# Patient Record
Sex: Female | Born: 1985 | Race: White | Hispanic: No | Smoking: Never smoker
Health system: Southern US, Community
[De-identification: ages and names within clinical notes are randomized; demographics above are authoritative.]

## PROBLEM LIST (undated history)

## (undated) DIAGNOSIS — Z789 Other specified health status: Secondary | ICD-10-CM

## (undated) DIAGNOSIS — Z8489 Family history of other specified conditions: Secondary | ICD-10-CM

## (undated) DIAGNOSIS — R112 Nausea with vomiting, unspecified: Secondary | ICD-10-CM

## (undated) DIAGNOSIS — O039 Complete or unspecified spontaneous abortion without complication: Secondary | ICD-10-CM

## (undated) DIAGNOSIS — E559 Vitamin D deficiency, unspecified: Secondary | ICD-10-CM

## (undated) DIAGNOSIS — T4145XA Adverse effect of unspecified anesthetic, initial encounter: Secondary | ICD-10-CM

## (undated) DIAGNOSIS — R0600 Dyspnea, unspecified: Secondary | ICD-10-CM

## (undated) DIAGNOSIS — F32A Depression, unspecified: Secondary | ICD-10-CM

## (undated) DIAGNOSIS — F329 Major depressive disorder, single episode, unspecified: Secondary | ICD-10-CM

## (undated) DIAGNOSIS — Z9889 Other specified postprocedural states: Secondary | ICD-10-CM

## (undated) DIAGNOSIS — K219 Gastro-esophageal reflux disease without esophagitis: Secondary | ICD-10-CM

## (undated) DIAGNOSIS — R600 Localized edema: Secondary | ICD-10-CM

## (undated) DIAGNOSIS — F319 Bipolar disorder, unspecified: Secondary | ICD-10-CM

## (undated) DIAGNOSIS — M549 Dorsalgia, unspecified: Secondary | ICD-10-CM

## (undated) DIAGNOSIS — F419 Anxiety disorder, unspecified: Secondary | ICD-10-CM

## (undated) DIAGNOSIS — M25371 Other instability, right ankle: Secondary | ICD-10-CM

## (undated) DIAGNOSIS — T8859XA Other complications of anesthesia, initial encounter: Secondary | ICD-10-CM

## (undated) HISTORY — DX: Depression, unspecified: F32.A

## (undated) HISTORY — DX: Complete or unspecified spontaneous abortion without complication: O03.9

## (undated) HISTORY — DX: Localized edema: R60.0

## (undated) HISTORY — DX: Major depressive disorder, single episode, unspecified: F32.9

## (undated) HISTORY — DX: Dyspnea, unspecified: R06.00

## (undated) HISTORY — DX: Dorsalgia, unspecified: M54.9

## (undated) HISTORY — DX: Other specified health status: Z78.9

## (undated) HISTORY — DX: Vitamin D deficiency, unspecified: E55.9

## (undated) HISTORY — PX: NO PAST SURGERIES: SHX2092

---
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
## 2009-06-26 ENCOUNTER — Inpatient Hospital Stay (HOSPITAL_COMMUNITY): Admission: AD | Admit: 2009-06-26 | Discharge: 2009-06-26 | Payer: Self-pay | Admitting: Obstetrics & Gynecology

## 2009-06-28 ENCOUNTER — Ambulatory Visit (HOSPITAL_COMMUNITY): Admission: RE | Admit: 2009-06-28 | Discharge: 2009-06-28 | Payer: Self-pay | Admitting: Obstetrics & Gynecology

## 2009-07-05 ENCOUNTER — Ambulatory Visit: Payer: Self-pay | Admitting: Obstetrics and Gynecology

## 2009-07-05 ENCOUNTER — Ambulatory Visit (HOSPITAL_COMMUNITY): Admission: RE | Admit: 2009-07-05 | Discharge: 2009-07-05 | Payer: Self-pay | Admitting: Obstetrics & Gynecology

## 2010-04-30 LAB — CBC
HCT: 41.4 % (ref 36.0–46.0)
Hemoglobin: 14.5 g/dL (ref 12.0–15.0)
MCHC: 35 g/dL (ref 30.0–36.0)
MCV: 89 fL (ref 78.0–100.0)
Platelets: 205 10*3/uL (ref 150–400)
RBC: 4.65 MIL/uL (ref 3.87–5.11)
WBC: 7 10*3/uL (ref 4.0–10.5)

## 2010-04-30 LAB — HCG, QUANTITATIVE, PREGNANCY
hCG, Beta Chain, Quant, S: 31 m[IU]/mL — ABNORMAL HIGH (ref <5)
hCG, Beta Chain, Quant, S: 4 m[IU]/mL (ref <5)
hCG, Beta Chain, Quant, S: 56 m[IU]/mL — ABNORMAL HIGH (ref <5)

## 2010-06-29 NOTE — Op Note (Signed)
[name], Kelly Fry NO.:  [id number]   MEDICAL RECORD NO.:  [id number]          PATIENT TYPE:  AMB   LOCATION:  DSC                          FACILITY:  [facility]   PHYSICIAN:  Lucky Cowboy, MD         DATE OF BIRTH:  [birth year]   DATE OF PROCEDURE:  07/04/2005  DATE OF DISCHARGE:  05/24/2005                                 OPERATIVE REPORT   PREOPERATIVE DIAGNOSIS:  Right peritonsillar abscess.   POSTOPERATIVE DIAGNOSIS:  Right peritonsillar abscess.   PROCEDURE:  Incision and drainage of right peritonsillar abscess.   SURGEON:  Lucky Cowboy, MD.   ANESTHESIA:  General.   ESTIMATED BLOOD LOSS:  Less than 20 mL.   SPECIMENS:  None.   COMPLICATIONS:  None.   INDICATIONS:  The patient is a 25 year old female who has had increasing  sore throat with the findings of a right peritonsillar abscess and enlarged  lymph nodes in the right cervical chain.  For this reason, incision and  drainage is performed.   FINDINGS:  The patient was noted to have a marked amount of pus in the right  peritonsillar space.   PROCEDURE:  The patient was taken to the operating room and placed on the  table in the supine position.  She was then placed under general  endotracheal anesthesia and the table rotated counterclockwise 90 degrees.  The neck was gently extended.  A Crowe-Davis mouth gag with a #3 tongue  blade was then placed intraorally, opened and suspended on the Mayo stand.  Palpation of the soft palate revealed marked fullness in the right soft  palate.  The Harmonic scalpel was then used to open up the right soft palate  were pus was evacuated.  The area was irrigated.  An NG tube was placed down  the esophagus for suctioning of the gastric contents.  The mouth gag was  removed  noting no damage to the teeth or soft tissues.  The table was rotated  clockwise 90 degrees to its original position.  The patient was awakened  from anesthesia and taken to the Post Anesthesia  Care Unit in stable  condition.  There were no complications.      Lucky Cowboy, MD  Electronically Signed     SJ/MEDQ  D:  07/04/2005  T:  07/05/2005  Job:  [id number]   cc:   [facility]
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
## 2013-02-11 NOTE — L&D Delivery Note (Signed)
Delivery Note  PRE-OPERATIVE DIAGNOSIS:  1) [redacted]w[redacted]d pregnancy.   POST-OPERATIVE DIAGNOSIS:  1) [redacted]w[redacted]d pregnancy s/p Vaginal, Spontaneous Delivery   Delivery Type: Vaginal, Spontaneous Delivery   Delivery Clinician: Aydrien Froman   Delivery Anesthesia: None   Labor Complications: None  Lacerations: None   ESTIMATED BLOOD LOSS: 300    Labor course: This is a 28 y.o. y.o. female [id number]  who came in at [redacted]w[redacted]d pregnancy for IOL 2/2 gHTN.  Her prenatal course was complicated by gHTN.  Initial cervical exam was 2.5/30/-2.  She was admitted to L and D.  Labor course included:  Cytotec x 1 Pitocin SROM   Procedure: Vaginal, Spontaneous Delivery    Date of birth: 11/16/2011   Time of birth: 2:03 AM    This [id number] woman under no anesthesia delivered a viable female  infant weighing Weight: 7 lb 10 oz (3.459 kg) (Filed from Delivery Summary)  with Apgars as listed below.  Delivery was via NSVD  Delivery completed and cord cut and clamped. Infant dried and stimulated. Infant to maternal abdomen. Cord pH not obtained. Active management of the third stage of labor performed. Intact placenta delivered spontaneously at 10/6  2:06 AM . Vagina and rectum explored and small periurethreal abrasions  Noted with good approximation of tissue and hemostasis.  Uterus well contracted at end of delivery.  Mother and infant tolerated delivery well.    FINDINGS:   1) female infant, Apgar scores of 9    at 1 minute 9    at 5 minutes   2) 3 Vessel Cord  3) Nuchal: Yes, loose  SPECIMENS: Placenta discarded; Cord gases not obtained  COMPLICATIONS: none  DISPOSITION:  Infant to NBN

## 2013-02-11 NOTE — L&D Delivery Note (Signed)
Attestation of Attending Supervision of Fellow: Evaluation and management procedures were performed by the Fellow under my supervision and collaboration.  I have reviewed the Fellow's note and chart, and I agree with the management and plan.    

## 2013-06-01 ENCOUNTER — Ambulatory Visit (INDEPENDENT_AMBULATORY_CARE_PROVIDER_SITE_OTHER): Payer: Self-pay

## 2013-06-01 ENCOUNTER — Encounter: Payer: Self-pay | Admitting: Obstetrics & Gynecology

## 2013-06-01 DIAGNOSIS — Z3201 Encounter for pregnancy test, result positive: Secondary | ICD-10-CM

## 2013-06-01 DIAGNOSIS — N926 Irregular menstruation, unspecified: Secondary | ICD-10-CM

## 2013-06-01 LAB — POCT PREGNANCY, URINE
PREG TEST UR: POSITIVE — AB
Preg Test, Ur: POSITIVE — AB

## 2013-06-01 NOTE — Progress Notes (Signed)
Pt. Here today for pregnancy test as she has not had a period since the middle of January; unsure of actual date of LMP though believes it was around the 11th.  Pregnancy test positive. Pt. Would like to receive care here.Pt. Would be around [redacted]w[redacted]d today based off LMP 1/11. New OB labs today. Letter of pregnancy verification given. Growth and anatomy ultrasound scheduled for July 12, 2013. EDD October 18th.

## 2013-06-02 LAB — OBSTETRIC PANEL
ANTIBODY SCREEN: NEGATIVE
Basophils Absolute: 0 10*3/uL (ref 0.0–0.1)
Basophils Relative: 0 % (ref 0–1)
EOS PCT: 1 % (ref 0–5)
Eosinophils Absolute: 0.1 10*3/uL (ref 0.0–0.7)
HEMATOCRIT: 36.5 % (ref 36.0–46.0)
HEMOGLOBIN: 12.6 g/dL (ref 12.0–15.0)
Hepatitis B Surface Ag: NEGATIVE
Lymphocytes Relative: 22 % (ref 12–46)
Lymphs Abs: 1.4 10*3/uL (ref 0.7–4.0)
MCH: 29.3 pg (ref 26.0–34.0)
MCHC: 34.5 g/dL (ref 30.0–36.0)
MCV: 84.9 fL (ref 78.0–100.0)
Monocytes Absolute: 0.4 10*3/uL (ref 0.1–1.0)
Monocytes Relative: 7 % (ref 3–12)
Neutro Abs: 4.3 10*3/uL (ref 1.7–7.7)
Neutrophils Relative %: 70 % (ref 43–77)
Platelets: 202 10*3/uL (ref 150–400)
RBC: 4.3 MIL/uL (ref 3.87–5.11)
RDW: 13.5 % (ref 11.5–15.5)
RH TYPE: NEGATIVE
Rubella: 1.74 Index — ABNORMAL HIGH (ref <0.90)
WBC: 6.2 10*3/uL (ref 4.0–10.5)

## 2013-06-02 LAB — CYSTIC FIBROSIS DIAGNOSTIC STUDY

## 2013-06-02 LAB — HIV ANTIBODY (ROUTINE TESTING W REFLEX): HIV 1&2 Ab, 4th Generation: NONREACTIVE

## 2013-06-03 ENCOUNTER — Encounter: Payer: Self-pay | Admitting: Obstetrics & Gynecology

## 2013-06-03 DIAGNOSIS — O26899 Other specified pregnancy related conditions, unspecified trimester: Secondary | ICD-10-CM | POA: Insufficient documentation

## 2013-06-03 DIAGNOSIS — Z6791 Unspecified blood type, Rh negative: Secondary | ICD-10-CM | POA: Insufficient documentation

## 2013-06-30 ENCOUNTER — Other Ambulatory Visit (HOSPITAL_COMMUNITY)
Admission: RE | Admit: 2013-06-30 | Discharge: 2013-06-30 | Disposition: A | Discharge status: Home / Self Care | Payer: Medicaid Other | Source: Ambulatory Visit | Attending: Obstetrics and Gynecology | Admitting: Obstetrics and Gynecology

## 2013-06-30 ENCOUNTER — Telehealth: Payer: Self-pay | Admitting: General Practice

## 2013-06-30 ENCOUNTER — Ambulatory Visit (INDEPENDENT_AMBULATORY_CARE_PROVIDER_SITE_OTHER): Payer: Medicaid Other | Admitting: Obstetrics and Gynecology

## 2013-06-30 ENCOUNTER — Encounter: Payer: Self-pay | Admitting: Obstetrics and Gynecology

## 2013-06-30 VITALS — BP 134/80 | HR 87 | Ht 64.0 in | Wt 228.0 lb

## 2013-06-30 DIAGNOSIS — Z6791 Unspecified blood type, Rh negative: Secondary | ICD-10-CM

## 2013-06-30 DIAGNOSIS — Z01419 Encounter for gynecological examination (general) (routine) without abnormal findings: Secondary | ICD-10-CM | POA: Insufficient documentation

## 2013-06-30 DIAGNOSIS — O0930 Supervision of pregnancy with insufficient antenatal care, unspecified trimester: Secondary | ICD-10-CM

## 2013-06-30 DIAGNOSIS — O0932 Supervision of pregnancy with insufficient antenatal care, second trimester: Secondary | ICD-10-CM | POA: Insufficient documentation

## 2013-06-30 DIAGNOSIS — O360990 Maternal care for other rhesus isoimmunization, unspecified trimester, not applicable or unspecified: Secondary | ICD-10-CM

## 2013-06-30 DIAGNOSIS — O26899 Other specified pregnancy related conditions, unspecified trimester: Principal | ICD-10-CM

## 2013-06-30 DIAGNOSIS — B372 Candidiasis of skin and nail: Secondary | ICD-10-CM

## 2013-06-30 DIAGNOSIS — Z113 Encounter for screening for infections with a predominantly sexual mode of transmission: Secondary | ICD-10-CM | POA: Insufficient documentation

## 2013-06-30 LAB — POCT URINALYSIS DIP (DEVICE)
Bilirubin Urine: NEGATIVE
GLUCOSE, UA: NEGATIVE mg/dL
HGB URINE DIPSTICK: NEGATIVE
KETONES UR: NEGATIVE mg/dL
NITRITE: NEGATIVE
Protein, ur: NEGATIVE mg/dL
Specific Gravity, Urine: 1.02 (ref 1.005–1.030)
UROBILINOGEN UA: 1 mg/dL (ref 0.0–1.0)
pH: 7 (ref 5.0–8.0)

## 2013-06-30 MED ORDER — TRIAMCINOLONE 0.1 % CREAM:EUCERIN CREAM 1:1: 1.0000 "application " | TOPICAL_CREAM | Freq: Three times a day (TID) | CUTANEOUS | Status: DC

## 2013-06-30 NOTE — Telephone Encounter (Signed)
Open in error

## 2013-06-30 NOTE — Progress Notes (Signed)
Pt complains of nausea and vomiting. Patient reports a rash on her abdomen and in her thighs.  New ob packet given to patient. She doesn't know her prepregnancy weight.

## 2013-06-30 NOTE — Progress Notes (Signed)
   Subjective:    Kelly Fry is a [id number] [redacted]w[redacted]d being seen today for her first obstetrical visit.  Her obstetrical history is significant for obesity. Patient does intend to breast feed. Pregnancy history fully reviewed.  Patient reports rash below breasts.  Filed Vitals:   06/30/13 0914 06/30/13 0918  BP: 134/80   Pulse: 87   Height:  5\' 4"  (1.626 m)  Weight: 228 lb (103.42 kg)     HISTORY: OB History  Gravida Para Term Preterm AB SAB TAB Ectopic Multiple Living  3 1 1  0 1 1 0 0 0 1    # Outcome Date GA Lbr Len/2nd Weight Sex Delivery Anes PTL Lv  3 CUR           2 SAB 2012          1 TRM 01/30/06 [redacted]w[redacted]d  7 lb 8 oz (3.402 kg) F SVD   Y     Comments: No complications      Past Medical History  Diagnosis Date  . Medical history non-contributory    Past Surgical History  Procedure Laterality Date  . No past surgeries     Family History  Problem Relation Age of Onset  . Kidney disease Father   . Kidney disease Sister      Exam    Uterus:   S sl > D, sure LMP  Pelvic Exam:    Perineum: No Hemorrhoids, Normal Perineum   Vulva: normal, Bartholin's, Urethra, Skene's normal   Vagina:  normal mucosa, normal discharge       Cervix: multiparous appearance and no bleeding following Pap   Adnexa: not evaluated   Bony Pelvis: average  System: Breast:  normal appearance, no masses or tenderness   Skin: normal coloration and turgor, no rashes Erythematous in intertriginous area below breasts   Neurologic: oriented, grossly non-focal   Extremities: normal strength, tone, and muscle mass, no erythema, induration, or nodules   HEENT PERRLA and extra ocular movement intact   Mouth/Teeth mucous membranes moist, pharynx normal without lesions   Neck supple and no masses   Cardiovascular: regular rate and rhythm, no murmurs or gallops   Respiratory:  appears well, vitals normal, no respiratory distress, acyanotic, normal RR, ear and throat exam is normal, neck free of  mass or lymphadenopathy, chest clear, no wheezing, crepitations, rhonchi, normal symmetric air entry   Abdomen: soft, non-tender; bowel sounds normal; no masses,  no organomegaly and FH= 21; FHR 144   Urinary: urethral meatus normal      Assessment:    Pregnancy: [id number] Patient Active Problem List   Diagnosis Date Noted  . Insufficient prenatal care in second trimester 06/30/2013  . Rh negative, antepartum 06/03/2013    Intertriginous rash breasts    Plan:     Initial labs drawn. Prenatal vitamins. Problem list reviewed and updated. Genetic Screening discussed Quad Screen: declined.  Ultrasound discussed; fetal survey: ordered.  Follow up in 4 weeks. 50% of 30 min visit spent on counseling and coordination of care.  Discussed continuing PN care in [place] at [facility] since she lives and works at [employer] in [place]; came here because wants delivery at [employer]. Rx Triancinalone cream   Arne Schlender C Jerek Meulemans 06/30/2013

## 2013-07-03 LAB — CULTURE, OB URINE: Colony Count: >=100000

## 2013-07-06 ENCOUNTER — Telehealth: Payer: Self-pay

## 2013-07-06 DIAGNOSIS — N390 Urinary tract infection, site not specified: Secondary | ICD-10-CM

## 2013-07-06 MED ORDER — CEPHALEXIN 500 MG PO CAPS: 500.0000 mg | ORAL_CAPSULE | Freq: Four times a day (QID) | ORAL | Status: DC

## 2013-07-06 NOTE — Telephone Encounter (Signed)
Message copied by Geanie Logan on Tue Jul 06, 2013  9:25 AM ------      Message from: Lorene Dy      Created: Sat Jul 03, 2013  6:11 PM       Please call in Keflex 500 qid x 7d ------

## 2013-07-06 NOTE — Telephone Encounter (Signed)
Medication e-prescribed. Patient informed. No questions or concerns.

## 2013-07-12 ENCOUNTER — Ambulatory Visit (HOSPITAL_COMMUNITY)
Admission: RE | Admit: 2013-07-12 | Discharge: 2013-07-12 | Disposition: A | Discharge status: Home / Self Care | Payer: Medicaid Other | Source: Ambulatory Visit | Attending: Obstetrics & Gynecology | Admitting: Obstetrics & Gynecology

## 2013-07-12 DIAGNOSIS — Z3689 Encounter for other specified antenatal screening: Secondary | ICD-10-CM | POA: Insufficient documentation

## 2013-07-12 DIAGNOSIS — Z3201 Encounter for pregnancy test, result positive: Secondary | ICD-10-CM

## 2013-07-13 ENCOUNTER — Encounter: Payer: Self-pay | Admitting: Obstetrics & Gynecology

## 2013-07-28 ENCOUNTER — Encounter: Payer: Self-pay | Admitting: Advanced Practice Midwife

## 2013-07-28 ENCOUNTER — Ambulatory Visit (INDEPENDENT_AMBULATORY_CARE_PROVIDER_SITE_OTHER): Payer: Medicaid Other | Admitting: Advanced Practice Midwife

## 2013-07-28 VITALS — BP 122/85 | HR 74 | Temp 98.9°F | Wt 231.0 lb

## 2013-07-28 DIAGNOSIS — R7309 Other abnormal glucose: Secondary | ICD-10-CM

## 2013-07-28 DIAGNOSIS — Z6791 Unspecified blood type, Rh negative: Secondary | ICD-10-CM

## 2013-07-28 DIAGNOSIS — O360990 Maternal care for other rhesus isoimmunization, unspecified trimester, not applicable or unspecified: Secondary | ICD-10-CM

## 2013-07-28 DIAGNOSIS — O26899 Other specified pregnancy related conditions, unspecified trimester: Principal | ICD-10-CM

## 2013-07-28 LAB — POCT URINALYSIS DIP (DEVICE)
Bilirubin Urine: NEGATIVE
GLUCOSE, UA: NEGATIVE mg/dL
HGB URINE DIPSTICK: NEGATIVE
Ketones, ur: NEGATIVE mg/dL
Nitrite: NEGATIVE
Protein, ur: NEGATIVE mg/dL
SPECIFIC GRAVITY, URINE: 1.02 (ref 1.005–1.030)
Urobilinogen, UA: 1 mg/dL (ref 0.0–1.0)
pH: 7 (ref 5.0–8.0)

## 2013-07-28 MED ORDER — TRIAMCINOLONE 0.1 % CREAM:EUCERIN CREAM 1:1: 1.0000 "application " | TOPICAL_CREAM | Freq: Two times a day (BID) | CUTANEOUS | Status: DC | PRN

## 2013-07-28 NOTE — Progress Notes (Signed)
Doing well.  Early glucola done.

## 2013-07-28 NOTE — Progress Notes (Signed)
Edema trace in feet. Early Glucola test- done.

## 2013-07-28 NOTE — Patient Instructions (Signed)
Second Trimester of Pregnancy The second trimester is from week 13 through week 28, months 4 through 6. The second trimester is often a time when you feel your best. Your body has also adjusted to being pregnant, and you begin to feel better physically. Usually, morning sickness has lessened or quit completely, you may have more energy, and you may have an increase in appetite. The second trimester is also a time when the fetus is growing rapidly. At the end of the sixth month, the fetus is about 9 inches long and weighs about 1 pounds. You will likely begin to feel the baby move (quickening) between 18 and 20 weeks of the pregnancy. BODY CHANGES Your body goes through many changes during pregnancy. The changes vary from woman to woman.   Your weight will continue to increase. You will notice your lower abdomen bulging out.  You may begin to get stretch marks on your hips, abdomen, and breasts.  You may develop headaches that can be relieved by medicines approved by your health care provider.  You may urinate more often because the fetus is pressing on your bladder.  You may develop or continue to have heartburn as a result of your pregnancy.  You may develop constipation because certain hormones are causing the muscles that push waste through your intestines to slow down.  You may develop hemorrhoids or swollen, bulging veins (varicose veins).  You may have back pain because of the weight gain and pregnancy hormones relaxing your joints between the bones in your pelvis and as a result of a shift in weight and the muscles that support your balance.  Your breasts will continue to grow and be tender.  Your gums may bleed and may be sensitive to brushing and flossing.  Dark spots or blotches (chloasma, mask of pregnancy) may develop on your face. This will likely fade after the baby is born.  A dark line from your belly button to the pubic area (linea nigra) may appear. This will likely fade  after the baby is born.  You may have changes in your hair. These can include thickening of your hair, rapid growth, and changes in texture. Some women also have hair loss during or after pregnancy, or hair that feels dry or thin. Your hair will most likely return to normal after your baby is born. WHAT TO EXPECT AT YOUR PRENATAL VISITS During a routine prenatal visit:  You will be weighed to make sure you and the fetus are growing normally.  Your blood pressure will be taken.  Your abdomen will be measured to track your baby's growth.  The fetal heartbeat will be listened to.  Any test results from the previous visit will be discussed. Your health care provider may ask you:  How you are feeling.  If you are feeling the baby move.  If you have had any abnormal symptoms, such as leaking fluid, bleeding, severe headaches, or abdominal cramping.  If you have any questions. Other tests that may be performed during your second trimester include:  Blood tests that check for:  Low iron levels (anemia).  Gestational diabetes (between 24 and 28 weeks).  Rh antibodies.  Urine tests to check for infections, diabetes, or protein in the urine.  An ultrasound to confirm the proper growth and development of the baby.  An amniocentesis to check for possible genetic problems.  Fetal screens for spina bifida and Down syndrome. HOME CARE INSTRUCTIONS   Avoid all smoking, herbs, alcohol, and unprescribed   drugs. These chemicals affect the formation and growth of the baby.  Follow your health care provider's instructions regarding medicine use. There are medicines that are either safe or unsafe to take during pregnancy.  Exercise only as directed by your health care provider. Experiencing uterine cramps is a good sign to stop exercising.  Continue to eat regular, healthy meals.  Wear a good support bra for breast tenderness.  Do not use hot tubs, steam rooms, or saunas.  Wear your  seat belt at all times when driving.  Avoid raw meat, uncooked cheese, cat litter boxes, and soil used by cats. These carry germs that can cause birth defects in the baby.  Take your prenatal vitamins.  Try taking a stool softener (if your health care provider approves) if you develop constipation. Eat more high-fiber foods, such as fresh vegetables or fruit and whole grains. Drink plenty of fluids to keep your urine clear or pale yellow.  Take warm sitz baths to soothe any pain or discomfort caused by hemorrhoids. Use hemorrhoid cream if your health care provider approves.  If you develop varicose veins, wear support hose. Elevate your feet for 15 minutes, 3-4 times a day. Limit salt in your diet.  Avoid heavy lifting, wear low heel shoes, and practice good posture.  Rest with your legs elevated if you have leg cramps or low back pain.  Visit your dentist if you have not gone yet during your pregnancy. Use a soft toothbrush to brush your teeth and be gentle when you floss.  A sexual relationship may be continued unless your health care provider directs you otherwise.  Continue to go to all your prenatal visits as directed by your health care provider. SEEK MEDICAL CARE IF:   You have dizziness.  You have mild pelvic cramps, pelvic pressure, or nagging pain in the abdominal area.  You have persistent nausea, vomiting, or diarrhea.  You have a bad smelling vaginal discharge.  You have pain with urination. SEEK IMMEDIATE MEDICAL CARE IF:   You have a fever.  You are leaking fluid from your vagina.  You have spotting or bleeding from your vagina.  You have severe abdominal cramping or pain.  You have rapid weight gain or loss.  You have shortness of breath with chest pain.  You notice sudden or extreme swelling of your face, hands, ankles, feet, or legs.  You have not felt your baby move in over an hour.  You have severe headaches that do not go away with  medicine.  You have vision changes. Document Released: 01/22/2001 Document Revised: 02/02/2013 Document Reviewed: 03/31/2012 ExitCare Patient Information 2015 [employer]. This information is not intended to replace advice given to you by your health care provider. Make sure you discuss any questions you have with your health care provider.  

## 2013-07-29 LAB — GLUCOSE TOLERANCE, 1 HOUR (50G) W/O FASTING: GLUCOSE 1 HOUR GTT: 128 mg/dL (ref 70–140)

## 2013-08-03 ENCOUNTER — Encounter (HOSPITAL_COMMUNITY): Payer: Self-pay | Admitting: Advanced Practice Midwife

## 2013-08-09 LAB — PRESCRIPTION MONITORING PROFILE (19 PANEL)
AMPHETAMINE/METH: NEGATIVE ng/mL
BARBITURATE SCREEN, URINE: NEGATIVE ng/mL
BENZODIAZEPINE SCREEN, URINE: NEGATIVE ng/mL
BUPRENORPHINE, URINE: NEGATIVE ng/mL
CREATININE, URINE: 105.74 mg/dL (ref >20.0)
Cannabinoid Scrn, Ur: NEGATIVE ng/mL
Carisoprodol, Urine: NEGATIVE ng/mL
Cocaine Metabolites: NEGATIVE ng/mL
ECSTASY: NEGATIVE ng/mL
FENTANYL URINE: NEGATIVE ng/mL
MEPERIDINE UR: NEGATIVE ng/mL
METHADONE SCREEN, URINE: NEGATIVE ng/mL
METHAQUALONE SCREEN (URINE): NEGATIVE ng/mL
NITRITES URINE, INITIAL: NEGATIVE ug/mL
Opiate Screen, Urine: NEGATIVE ng/mL
Oxycodone Screen, Ur: NEGATIVE ng/mL
PROPOXYPHENE: NEGATIVE ng/mL
Phencyclidine, Ur: NEGATIVE ng/mL
TAPENTADOLUR: NEGATIVE ng/mL
Tramadol Scrn, Ur: NEGATIVE ng/mL
Zolpidem, Urine: NEGATIVE ng/mL
pH, Initial: 7.6 pH (ref 4.5–8.9)

## 2013-08-16 ENCOUNTER — Ambulatory Visit (HOSPITAL_COMMUNITY)
Admission: RE | Admit: 2013-08-16 | Discharge: 2013-08-16 | Disposition: A | Discharge status: Home / Self Care | Payer: Medicaid Other | Source: Ambulatory Visit | Attending: Advanced Practice Midwife | Admitting: Advanced Practice Midwife

## 2013-08-16 DIAGNOSIS — Z3689 Encounter for other specified antenatal screening: Secondary | ICD-10-CM | POA: Diagnosis not present

## 2013-08-16 DIAGNOSIS — Z6791 Unspecified blood type, Rh negative: Secondary | ICD-10-CM

## 2013-08-16 DIAGNOSIS — O26899 Other specified pregnancy related conditions, unspecified trimester: Secondary | ICD-10-CM

## 2013-08-16 DIAGNOSIS — O360990 Maternal care for other rhesus isoimmunization, unspecified trimester, not applicable or unspecified: Secondary | ICD-10-CM | POA: Diagnosis not present

## 2013-08-25 ENCOUNTER — Ambulatory Visit (INDEPENDENT_AMBULATORY_CARE_PROVIDER_SITE_OTHER): Payer: Medicaid Other | Admitting: Family

## 2013-08-25 VITALS — BP 129/84 | HR 91 | Temp 98.1°F | Wt 239.2 lb

## 2013-08-25 DIAGNOSIS — Z3480 Encounter for supervision of other normal pregnancy, unspecified trimester: Secondary | ICD-10-CM

## 2013-08-25 DIAGNOSIS — Z3492 Encounter for supervision of normal pregnancy, unspecified, second trimester: Secondary | ICD-10-CM

## 2013-08-25 LAB — CBC
HCT: 33.3 % — ABNORMAL LOW (ref 36.0–46.0)
HEMOGLOBIN: 11.5 g/dL — AB (ref 12.0–15.0)
MCH: 29.9 pg (ref 26.0–34.0)
MCHC: 34.5 g/dL (ref 30.0–36.0)
MCV: 86.5 fL (ref 78.0–100.0)
PLATELETS: 201 10*3/uL (ref 150–400)
RBC: 3.85 MIL/uL — ABNORMAL LOW (ref 3.87–5.11)
RDW: 13.3 % (ref 11.5–15.5)
WBC: 8.4 10*3/uL (ref 4.0–10.5)

## 2013-08-25 LAB — POCT URINALYSIS DIP (DEVICE)
Bilirubin Urine: NEGATIVE
Glucose, UA: NEGATIVE mg/dL
HGB URINE DIPSTICK: NEGATIVE
Ketones, ur: NEGATIVE mg/dL
NITRITE: NEGATIVE
Protein, ur: NEGATIVE mg/dL
Specific Gravity, Urine: 1.015 (ref 1.005–1.030)
UROBILINOGEN UA: 0.2 mg/dL (ref 0.0–1.0)
pH: 7 (ref 5.0–8.0)

## 2013-08-25 MED ORDER — NITROFURANTOIN MONOHYD MACRO 100 MG PO CAPS: 100.0000 mg | ORAL_CAPSULE | Freq: Two times a day (BID) | ORAL | Status: DC

## 2013-08-25 NOTE — Progress Notes (Signed)
Doing well; no questions or concerns.  Reviewed ultrasound results (nml heart).  Large leuks in urine, no symptoms.  RX Macrobid, urine culture sent.  1 hr today.

## 2013-08-25 NOTE — Progress Notes (Signed)
Needs Rhogam, pt is undecided about TDap vaccine, information sheet given.

## 2013-08-26 ENCOUNTER — Telehealth: Payer: Self-pay

## 2013-08-26 ENCOUNTER — Encounter: Payer: Self-pay | Admitting: Family

## 2013-08-26 LAB — HIV ANTIBODY (ROUTINE TESTING W REFLEX): HIV: NONREACTIVE

## 2013-08-26 LAB — RPR

## 2013-08-26 LAB — GLUCOSE TOLERANCE, 1 HOUR (50G) W/O FASTING: Glucose, 1 Hour GTT: 137 mg/dL (ref 70–140)

## 2013-08-26 NOTE — Telephone Encounter (Signed)
Pt returned call to the clinic.   Attempted to contact patient with number provided by patient, no answer, unable to leave a message.

## 2013-08-26 NOTE — Telephone Encounter (Addendum)
Attempted to call patient regarding 1hr gtt results and the need for 3hr gtt. Felipa Evener number has been disconnected. Called other contact who stated the number we have is correct-- patient may not have phone on. Contact stated she will let patient know we are calling and have her call clinic.

## 2013-08-26 NOTE — Telephone Encounter (Signed)
Contacted patient, gave results, scheduled patient for 3 hour gtt on Monday July 20 @ 0800.  Instructions given, pt verbalizes understanding.

## 2013-08-30 ENCOUNTER — Other Ambulatory Visit: Payer: Medicaid Other

## 2013-08-30 DIAGNOSIS — R7309 Other abnormal glucose: Secondary | ICD-10-CM

## 2013-08-30 LAB — CULTURE, OB URINE

## 2013-08-31 ENCOUNTER — Encounter: Payer: Self-pay | Admitting: Family

## 2013-08-31 LAB — GLUCOSE TOLERANCE, 3 HOURS
GLUCOSE 3 HOUR GTT: 91 mg/dL (ref 70–144)
GLUCOSE, 1 HOUR-GESTATIONAL: 125 mg/dL (ref 70–189)
Glucose Tolerance, 2 hour: 119 mg/dL (ref 70–164)
Glucose Tolerance, Fasting: 72 mg/dL (ref 70–104)

## 2013-09-15 ENCOUNTER — Ambulatory Visit (INDEPENDENT_AMBULATORY_CARE_PROVIDER_SITE_OTHER): Payer: Medicaid Other | Admitting: Family

## 2013-09-15 VITALS — BP 139/78 | HR 79 | Temp 98.4°F | Wt 239.5 lb

## 2013-09-15 DIAGNOSIS — Z23 Encounter for immunization: Secondary | ICD-10-CM

## 2013-09-15 DIAGNOSIS — O360131 Maternal care for anti-D [Rh] antibodies, third trimester, fetus 1: Secondary | ICD-10-CM

## 2013-09-15 DIAGNOSIS — O360990 Maternal care for other rhesus isoimmunization, unspecified trimester, not applicable or unspecified: Secondary | ICD-10-CM

## 2013-09-15 DIAGNOSIS — O3090 Multiple gestation, unspecified, unspecified trimester: Secondary | ICD-10-CM

## 2013-09-15 LAB — POCT URINALYSIS DIP (DEVICE)
Bilirubin Urine: NEGATIVE
GLUCOSE, UA: NEGATIVE mg/dL
Ketones, ur: NEGATIVE mg/dL
NITRITE: NEGATIVE
Protein, ur: NEGATIVE mg/dL
Specific Gravity, Urine: 1.01 (ref 1.005–1.030)
UROBILINOGEN UA: 0.2 mg/dL (ref 0.0–1.0)
pH: 6 (ref 5.0–8.0)

## 2013-09-15 MED ORDER — RHO D IMMUNE GLOBULIN 1500 UNIT/2ML IJ SOSY
300.0000 ug | PREFILLED_SYRINGE | Freq: Once | INTRAMUSCULAR | Status: AC
  Administered 2013-09-15: 300 ug via INTRAMUSCULAR

## 2013-09-15 MED ORDER — RHO D IMMUNE GLOBULIN 1500 UNIT/2ML IJ SOSY: 300.0000 ug | PREFILLED_SYRINGE | Freq: Once | INTRAMUSCULAR | Status: DC

## 2013-09-15 MED ORDER — TETANUS-DIPHTH-ACELL PERTUSSIS 5-2.5-18.5 LF-MCG/0.5 IM SUSP: 0.5000 mL | Freq: Once | INTRAMUSCULAR | Status: DC

## 2013-09-15 NOTE — Assessment & Plan Note (Signed)
Rhogam given 09/15/2013

## 2013-09-15 NOTE — Addendum Note (Signed)
Addended by: Rutherford Nail E on: 09/15/2013 03:18 PM   Modules accepted: Orders

## 2013-09-15 NOTE — Progress Notes (Signed)
Reports occasional edema in feet and ankles.

## 2013-09-15 NOTE — Progress Notes (Signed)
Large leuks, currently taking antibiotics for UTI.  Send urine culture next week.  Rhogam today.

## 2013-09-17 LAB — CULTURE, OB URINE
COLONY COUNT: NO GROWTH
ORGANISM ID, BACTERIA: NO GROWTH

## 2013-09-29 ENCOUNTER — Ambulatory Visit (INDEPENDENT_AMBULATORY_CARE_PROVIDER_SITE_OTHER): Payer: Medicaid Other | Admitting: Advanced Practice Midwife

## 2013-09-29 VITALS — BP 131/78 | HR 77 | Temp 98.3°F | Wt 243.8 lb

## 2013-09-29 DIAGNOSIS — M25559 Pain in unspecified hip: Secondary | ICD-10-CM

## 2013-09-29 DIAGNOSIS — O9989 Other specified diseases and conditions complicating pregnancy, childbirth and the puerperium: Secondary | ICD-10-CM

## 2013-09-29 DIAGNOSIS — O26893 Other specified pregnancy related conditions, third trimester: Principal | ICD-10-CM

## 2013-09-29 LAB — POCT URINALYSIS DIP (DEVICE)
Bilirubin Urine: NEGATIVE
Glucose, UA: NEGATIVE mg/dL
Ketones, ur: NEGATIVE mg/dL
NITRITE: NEGATIVE
PROTEIN: NEGATIVE mg/dL
Specific Gravity, Urine: 1.01 (ref 1.005–1.030)
UROBILINOGEN UA: 0.2 mg/dL (ref 0.0–1.0)
pH: 7 (ref 5.0–8.0)

## 2013-09-29 MED ORDER — COMFORT FIT MATERNITY SUPP LG MISC: 1.0000 | Freq: Every day | Status: DC

## 2013-09-29 NOTE — Patient Instructions (Signed)
Third Trimester of Pregnancy The third trimester is from week 29 through week 42, months 7 through 9. The third trimester is a time when the fetus is growing rapidly. At the end of the ninth month, the fetus is about 20 inches in length and weighs 6-10 pounds.  BODY CHANGES Your body goes through many changes during pregnancy. The changes vary from woman to woman.   Your weight will continue to increase. You can expect to gain 25-35 pounds (11-16 kg) by the end of the pregnancy.  You may begin to get stretch marks on your hips, abdomen, and breasts.  You may urinate more often because the fetus is moving lower into your pelvis and pressing on your bladder.  You may develop or continue to have heartburn as a result of your pregnancy.  You may develop constipation because certain hormones are causing the muscles that push waste through your intestines to slow down.  You may develop hemorrhoids or swollen, bulging veins (varicose veins).  You may have pelvic pain because of the weight gain and pregnancy hormones relaxing your joints between the bones in your pelvis. Backaches may result from overexertion of the muscles supporting your posture.  You may have changes in your hair. These can include thickening of your hair, rapid growth, and changes in texture. Some women also have hair loss during or after pregnancy, or hair that feels dry or thin. Your hair will most likely return to normal after your baby is born.  Your breasts will continue to grow and be tender. A yellow discharge may leak from your breasts called colostrum.  Your belly button may stick out.  You may feel short of breath because of your expanding uterus.  You may notice the fetus "dropping," or moving lower in your abdomen.  You may have a bloody mucus discharge. This usually occurs a few days to a week before labor begins.  Your cervix becomes thin and soft (effaced) near your due date. WHAT TO EXPECT AT YOUR PRENATAL  EXAMS  You will have prenatal exams every 2 weeks until week 36. Then, you will have weekly prenatal exams. During a routine prenatal visit:  You will be weighed to make sure you and the fetus are growing normally.  Your blood pressure is taken.  Your abdomen will be measured to track your baby's growth.  The fetal heartbeat will be listened to.  Any test results from the previous visit will be discussed.  You may have a cervical check near your due date to see if you have effaced. At around 36 weeks, your caregiver will check your cervix. At the same time, your caregiver will also perform a test on the secretions of the vaginal tissue. This test is to determine if a type of bacteria, Group B streptococcus, is present. Your caregiver will explain this further. Your caregiver may ask you:  What your birth plan is.  How you are feeling.  If you are feeling the baby move.  If you have had any abnormal symptoms, such as leaking fluid, bleeding, severe headaches, or abdominal cramping.  If you have any questions. Other tests or screenings that may be performed during your third trimester include:  Blood tests that check for low iron levels (anemia).  Fetal testing to check the health, activity level, and growth of the fetus. Testing is done if you have certain medical conditions or if there are problems during the pregnancy. FALSE LABOR You may feel small, irregular contractions that   eventually go away. These are called Braxton Hicks contractions, or false labor. Contractions may last for hours, days, or even weeks before true labor sets in. If contractions come at regular intervals, intensify, or become painful, it is best to be seen by your caregiver.  SIGNS OF LABOR   Menstrual-like cramps.  Contractions that are 5 minutes apart or less.  Contractions that start on the top of the uterus and spread down to the lower abdomen and back.  A sense of increased pelvic pressure or back  pain.  A watery or bloody mucus discharge that comes from the vagina. If you have any of these signs before the 37th week of pregnancy, call your caregiver right away. You need to go to the hospital to get checked immediately. HOME CARE INSTRUCTIONS   Avoid all smoking, herbs, alcohol, and unprescribed drugs. These chemicals affect the formation and growth of the baby.  Follow your caregiver's instructions regarding medicine use. There are medicines that are either safe or unsafe to take during pregnancy.  Exercise only as directed by your caregiver. Experiencing uterine cramps is a good sign to stop exercising.  Continue to eat regular, healthy meals.  Wear a good support bra for breast tenderness.  Do not use hot tubs, steam rooms, or saunas.  Wear your seat belt at all times when driving.  Avoid raw meat, uncooked cheese, cat litter boxes, and soil used by cats. These carry germs that can cause birth defects in the baby.  Take your prenatal vitamins.  Try taking a stool softener (if your caregiver approves) if you develop constipation. Eat more high-fiber foods, such as fresh vegetables or fruit and whole grains. Drink plenty of fluids to keep your urine clear or pale yellow.  Take warm sitz baths to soothe any pain or discomfort caused by hemorrhoids. Use hemorrhoid cream if your caregiver approves.  If you develop varicose veins, wear support hose. Elevate your feet for 15 minutes, 3-4 times a day. Limit salt in your diet.  Avoid heavy lifting, wear low heal shoes, and practice good posture.  Rest a lot with your legs elevated if you have leg cramps or low back pain.  Visit your dentist if you have not gone during your pregnancy. Use a soft toothbrush to brush your teeth and be gentle when you floss.  A sexual relationship may be continued unless your caregiver directs you otherwise.  Do not travel far distances unless it is absolutely necessary and only with the approval  of your caregiver.  Take prenatal classes to understand, practice, and ask questions about the labor and delivery.  Make a trial run to the hospital.  Pack your hospital bag.  Prepare the baby's nursery.  Continue to go to all your prenatal visits as directed by your caregiver. SEEK MEDICAL CARE IF:  You are unsure if you are in labor or if your water has broken.  You have dizziness.  You have mild pelvic cramps, pelvic pressure, or nagging pain in your abdominal area.  You have persistent nausea, vomiting, or diarrhea.  You have a bad smelling vaginal discharge.  You have pain with urination. SEEK IMMEDIATE MEDICAL CARE IF:   You have a fever.  You are leaking fluid from your vagina.  You have spotting or bleeding from your vagina.  You have severe abdominal cramping or pain.  You have rapid weight loss or gain.  You have shortness of breath with chest pain.  You notice sudden or extreme swelling   of your face, hands, ankles, feet, or legs. °· You have not felt your baby move in over an hour. °· You have severe headaches that do not go away with medicine. °· You have vision changes. °Document Released: 01/22/2001 Document Revised: 02/02/2013 Document Reviewed: 03/31/2012 °[employer]® Patient Information ©2015 [employer]. This information is not intended to replace advice given to you by your health care provider. Make sure you discuss any questions you have with your health care provider. °Braxton Hicks Contractions °Contractions of the uterus can occur throughout pregnancy. Contractions are not always a sign that you are in labor.  °WHAT ARE BRAXTON HICKS CONTRACTIONS?  °Contractions that occur before labor are called Braxton Hicks contractions, or false labor. Toward the end of pregnancy (32-34 weeks), these contractions can develop more often and may become more forceful. This is not true labor because these contractions do not result in opening (dilatation) and thinning of  the cervix. They are sometimes difficult to tell apart from true labor because these contractions can be forceful and people have different pain tolerances. You should not feel embarrassed if you go to the hospital with false labor. Sometimes, the only way to tell if you are in true labor is for your health care provider to look for changes in the cervix. °If there are no prenatal problems or other health problems associated with the pregnancy, it is completely safe to be sent home with false labor and await the onset of true labor. °HOW CAN YOU TELL THE DIFFERENCE BETWEEN TRUE AND FALSE LABOR? °False Labor °· The contractions of false labor are usually shorter and not as hard as those of true labor.   °· The contractions are usually irregular.   °· The contractions are often felt in the front of the lower abdomen and in the groin.   °· The contractions may go away when you walk around or change positions while lying down.   °· The contractions get weaker and are shorter lasting as time goes on.   °· The contractions do not usually become progressively stronger, regular, and closer together as with true labor.   °True Labor °· Contractions in true labor last 30-70 seconds, become very regular, usually become more intense, and increase in frequency.   °· The contractions do not go away with walking.   °· The discomfort is usually felt in the top of the uterus and spreads to the lower abdomen and low back.   °· True labor can be determined by your health care provider with an exam. This will show that the cervix is dilating and getting thinner.   °WHAT TO REMEMBER °· Keep up with your usual exercises and follow other instructions given by your health care provider.   °· Take medicines as directed by your health care provider.   °· Keep your regular prenatal appointments.   °· Eat and drink lightly if you think you are going into labor.   °· If Braxton Hicks contractions are making you uncomfortable:   °· Change your  position from lying down or resting to walking, or from walking to resting.   °· Sit and rest in a tub of warm water.   °· Drink 2-3 glasses of water. Dehydration may cause these contractions.   °· Do slow and deep breathing several times an hour.   °WHEN SHOULD I SEEK IMMEDIATE MEDICAL CARE? °Seek immediate medical care if: °· Your contractions become stronger, more regular, and closer together.   °· You have fluid leaking or gushing from your vagina.   °· You have a fever.   °· You pass blood-tinged mucus.   °·   You have vaginal bleeding.   °· You have continuous abdominal pain.   °· You have low back pain that you never had before.   °· You feel your baby's head pushing down and causing pelvic pressure.   °· Your baby is not moving as much as it used to.   °Document Released: 01/28/2005 Document Revised: 02/02/2013 Document Reviewed: 11/09/2012 °[employer]® Patient Information ©2015 [employer]. This information is not intended to replace advice given to you by your health care provider. Make sure you discuss any questions you have with your health care provider. ° °

## 2013-09-29 NOTE — Progress Notes (Signed)
Patient reports a lot of hip pain lately and difficulty getting out of bed

## 2013-09-29 NOTE — Progress Notes (Signed)
Bilat hip pain after working long hours. Rec maternity support belt. Support measures.

## 2013-10-11 ENCOUNTER — Encounter: Payer: Self-pay | Admitting: Advanced Practice Midwife

## 2013-10-11 ENCOUNTER — Ambulatory Visit (INDEPENDENT_AMBULATORY_CARE_PROVIDER_SITE_OTHER): Payer: Medicaid Other | Admitting: Advanced Practice Midwife

## 2013-10-11 VITALS — BP 134/78 | HR 86 | Wt 247.0 lb

## 2013-10-11 DIAGNOSIS — M25559 Pain in unspecified hip: Secondary | ICD-10-CM

## 2013-10-11 DIAGNOSIS — O26893 Other specified pregnancy related conditions, third trimester: Principal | ICD-10-CM

## 2013-10-11 DIAGNOSIS — O9989 Other specified diseases and conditions complicating pregnancy, childbirth and the puerperium: Secondary | ICD-10-CM

## 2013-10-11 LAB — POCT URINALYSIS DIP (DEVICE)
Bilirubin Urine: NEGATIVE
Glucose, UA: NEGATIVE mg/dL
Hgb urine dipstick: NEGATIVE
KETONES UR: NEGATIVE mg/dL
Nitrite: NEGATIVE
Protein, ur: NEGATIVE mg/dL
SPECIFIC GRAVITY, URINE: 1.015 (ref 1.005–1.030)
Urobilinogen, UA: 1 mg/dL (ref 0.0–1.0)
pH: 7.5 (ref 5.0–8.0)

## 2013-10-11 NOTE — Patient Instructions (Signed)
Third Trimester of Pregnancy The third trimester is from week 29 through week 42, months 7 through 9. The third trimester is a time when the fetus is growing rapidly. At the end of the ninth month, the fetus is about 20 inches in length and weighs 6-10 pounds.  BODY CHANGES Your body goes through many changes during pregnancy. The changes vary from woman to woman.   Your weight will continue to increase. You can expect to gain 25-35 pounds (11-16 kg) by the end of the pregnancy.  You may begin to get stretch marks on your hips, abdomen, and breasts.  You may urinate more often because the fetus is moving lower into your pelvis and pressing on your bladder.  You may develop or continue to have heartburn as a result of your pregnancy.  You may develop constipation because certain hormones are causing the muscles that push waste through your intestines to slow down.  You may develop hemorrhoids or swollen, bulging veins (varicose veins).  You may have pelvic pain because of the weight gain and pregnancy hormones relaxing your joints between the bones in your pelvis. Backaches may result from overexertion of the muscles supporting your posture.  You may have changes in your hair. These can include thickening of your hair, rapid growth, and changes in texture. Some women also have hair loss during or after pregnancy, or hair that feels dry or thin. Your hair will most likely return to normal after your baby is born.  Your breasts will continue to grow and be tender. A yellow discharge may leak from your breasts called colostrum.  Your belly button may stick out.  You may feel short of breath because of your expanding uterus.  You may notice the fetus "dropping," or moving lower in your abdomen.  You may have a bloody mucus discharge. This usually occurs a few days to a week before labor begins.  Your cervix becomes thin and soft (effaced) near your due date. WHAT TO EXPECT AT YOUR PRENATAL  EXAMS  You will have prenatal exams every 2 weeks until week 36. Then, you will have weekly prenatal exams. During a routine prenatal visit:  You will be weighed to make sure you and the fetus are growing normally.  Your blood pressure is taken.  Your abdomen will be measured to track your baby's growth.  The fetal heartbeat will be listened to.  Any test results from the previous visit will be discussed.  You may have a cervical check near your due date to see if you have effaced. At around 36 weeks, your caregiver will check your cervix. At the same time, your caregiver will also perform a test on the secretions of the vaginal tissue. This test is to determine if a type of bacteria, Group B streptococcus, is present. Your caregiver will explain this further. Your caregiver may ask you:  What your birth plan is.  How you are feeling.  If you are feeling the baby move.  If you have had any abnormal symptoms, such as leaking fluid, bleeding, severe headaches, or abdominal cramping.  If you have any questions. Other tests or screenings that may be performed during your third trimester include:  Blood tests that check for low iron levels (anemia).  Fetal testing to check the health, activity level, and growth of the fetus. Testing is done if you have certain medical conditions or if there are problems during the pregnancy. FALSE LABOR You may feel small, irregular contractions that   eventually go away. These are called Braxton Hicks contractions, or false labor. Contractions may last for hours, days, or even weeks before true labor sets in. If contractions come at regular intervals, intensify, or become painful, it is best to be seen by your caregiver.  SIGNS OF LABOR   Menstrual-like cramps.  Contractions that are 5 minutes apart or less.  Contractions that start on the top of the uterus and spread down to the lower abdomen and back.  A sense of increased pelvic pressure or back  pain.  A watery or bloody mucus discharge that comes from the vagina. If you have any of these signs before the 37th week of pregnancy, call your caregiver right away. You need to go to the hospital to get checked immediately. HOME CARE INSTRUCTIONS   Avoid all smoking, herbs, alcohol, and unprescribed drugs. These chemicals affect the formation and growth of the baby.  Follow your caregiver's instructions regarding medicine use. There are medicines that are either safe or unsafe to take during pregnancy.  Exercise only as directed by your caregiver. Experiencing uterine cramps is a good sign to stop exercising.  Continue to eat regular, healthy meals.  Wear a good support bra for breast tenderness.  Do not use hot tubs, steam rooms, or saunas.  Wear your seat belt at all times when driving.  Avoid raw meat, uncooked cheese, cat litter boxes, and soil used by cats. These carry germs that can cause birth defects in the baby.  Take your prenatal vitamins.  Try taking a stool softener (if your caregiver approves) if you develop constipation. Eat more high-fiber foods, such as fresh vegetables or fruit and whole grains. Drink plenty of fluids to keep your urine clear or pale yellow.  Take warm sitz baths to soothe any pain or discomfort caused by hemorrhoids. Use hemorrhoid cream if your caregiver approves.  If you develop varicose veins, wear support hose. Elevate your feet for 15 minutes, 3-4 times a day. Limit salt in your diet.  Avoid heavy lifting, wear low heal shoes, and practice good posture.  Rest a lot with your legs elevated if you have leg cramps or low back pain.  Visit your dentist if you have not gone during your pregnancy. Use a soft toothbrush to brush your teeth and be gentle when you floss.  A sexual relationship may be continued unless your caregiver directs you otherwise.  Do not travel far distances unless it is absolutely necessary and only with the approval  of your caregiver.  Take prenatal classes to understand, practice, and ask questions about the labor and delivery.  Make a trial run to the hospital.  Pack your hospital bag.  Prepare the baby's nursery.  Continue to go to all your prenatal visits as directed by your caregiver. SEEK MEDICAL CARE IF:  You are unsure if you are in labor or if your water has broken.  You have dizziness.  You have mild pelvic cramps, pelvic pressure, or nagging pain in your abdominal area.  You have persistent nausea, vomiting, or diarrhea.  You have a bad smelling vaginal discharge.  You have pain with urination. SEEK IMMEDIATE MEDICAL CARE IF:   You have a fever.  You are leaking fluid from your vagina.  You have spotting or bleeding from your vagina.  You have severe abdominal cramping or pain.  You have rapid weight loss or gain.  You have shortness of breath with chest pain.  You notice sudden or extreme swelling   of your face, hands, ankles, feet, or legs.  You have not felt your baby move in over an hour.  You have severe headaches that do not go away with medicine.  You have vision changes. Document Released: 01/22/2001 Document Revised: 02/02/2013 Document Reviewed: 03/31/2012 [employer] Patient Information 2015 [employer]. This information is not intended to replace advice given to you by your health care provider. Make sure you discuss any questions you have with your health care provider.  

## 2013-10-11 NOTE — Progress Notes (Signed)
Doing well except for continued pelvic and leg pain with working (on feet at [employer]).  May slow work schedule. Good FM.

## 2013-10-13 ENCOUNTER — Encounter: Payer: Medicaid Other | Admitting: Advanced Practice Midwife

## 2013-10-26 ENCOUNTER — Ambulatory Visit (INDEPENDENT_AMBULATORY_CARE_PROVIDER_SITE_OTHER): Payer: Medicaid Other | Admitting: Obstetrics & Gynecology

## 2013-10-26 ENCOUNTER — Encounter: Payer: Self-pay | Admitting: Obstetrics & Gynecology

## 2013-10-26 ENCOUNTER — Other Ambulatory Visit: Payer: Self-pay | Admitting: Obstetrics & Gynecology

## 2013-10-26 VITALS — BP 140/80 | HR 81 | Temp 98.4°F | Wt 252.7 lb

## 2013-10-26 DIAGNOSIS — O360990 Maternal care for other rhesus isoimmunization, unspecified trimester, not applicable or unspecified: Secondary | ICD-10-CM

## 2013-10-26 DIAGNOSIS — O3090 Multiple gestation, unspecified, unspecified trimester: Secondary | ICD-10-CM

## 2013-10-26 DIAGNOSIS — O360131 Maternal care for anti-D [Rh] antibodies, third trimester, fetus 1: Secondary | ICD-10-CM

## 2013-10-26 DIAGNOSIS — Z23 Encounter for immunization: Secondary | ICD-10-CM

## 2013-10-26 LAB — POCT URINALYSIS DIP (DEVICE)
Bilirubin Urine: NEGATIVE
GLUCOSE, UA: NEGATIVE mg/dL
HGB URINE DIPSTICK: NEGATIVE
Ketones, ur: NEGATIVE mg/dL
Nitrite: NEGATIVE
PH: 7 (ref 5.0–8.0)
Protein, ur: NEGATIVE mg/dL
SPECIFIC GRAVITY, URINE: 1.015 (ref 1.005–1.030)
UROBILINOGEN UA: 0.2 mg/dL (ref 0.0–1.0)

## 2013-10-26 LAB — OB RESULTS CONSOLE GC/CHLAMYDIA
CHLAMYDIA, DNA PROBE: NEGATIVE
GC PROBE AMP, GENITAL: NEGATIVE

## 2013-10-26 LAB — OB RESULTS CONSOLE GBS: GBS: NEGATIVE

## 2013-10-26 MED ORDER — TETANUS-DIPHTH-ACELL PERTUSSIS 5-2.5-18.5 LF-MCG/0.5 IM SUSP: 0.5000 mL | Freq: Once | INTRAMUSCULAR | Status: DC

## 2013-10-26 NOTE — Progress Notes (Signed)
Discussed flu and tdap. Patient reports discussing Tdap with provider at previous visit and declines it. States she will think about the flu.

## 2013-10-26 NOTE — Progress Notes (Signed)
Pt reports feeling 'bad' for 1 month GBS and cx obtained today Labor precautions Repeat BP 133/87

## 2013-10-26 NOTE — Patient Instructions (Addendum)
Group B Streptococcus Infection During Pregnancy Group B streptococcus (GBS) is a type of bacteria often found in healthy women. GBS is not the same as the bacteria that causes strep throat. You may have GBS in your vagina, rectum, or bladder. GBS does not spread through sexual contact, but it can be passed to a baby during childbirth. This can be dangerous for your baby. It is not dangerous to you and usually does not cause any symptoms. Your health care provider may test you for GBS when your pregnancy is between 35 and 37 weeks. GBS is dangerous only during birth, so there is no need to test for it earlier. It is possible to have GBS during pregnancy and never pass it to your baby. If your test results are positive for GBS, your health care provider may recommend giving you antibiotic medicine during delivery to make sure your baby stays healthy. RISK FACTORS You are more likely to pass GBS to your baby if:   Your water breaks (ruptured membrane) or you go into labor before 37 weeks.  Your water breaks 18 hours before you deliver.  You passed GBS during a previous pregnancy.  You have a urinary tract infection caused by GBS any time during pregnancy.  You have a fever during labor. SYMPTOMS Most women who have GBS do not have any symptoms. If you have a urinary tract infection caused by GBS, you might have frequent or painful urination and fever. Babies who get GBS usually show symptoms within 7 days of birth. Symptoms may include:   Breathing problems.  Heart and blood pressure problems.  Digestive and kidney problems. DIAGNOSIS Routine screening for GBS is recommended for all pregnant women. A health care provider takes a sample of the fluid in your vagina and rectum with a swab. It is then sent to a lab to be checked for GBS. A sample of your urine may also be checked for the bacteria.  TREATMENT If you test positive for GBS, you may need treatment with an antibiotic medicine during  labor. As soon as you go into labor, or as soon as your membranes rupture, you will get the antibiotic medicine through an IV access. You will continue to get the medicine until after you give birth. You do not need antibiotic medicine if you are having a cesarean delivery.If your baby shows signs or symptoms of GBS after birth, your baby can also be treated with an antibiotic medicine. HOME CARE INSTRUCTIONS   Take all antibiotic medicine as prescribed by your health care provider. Only take medicine as directed.   Continue with prenatal visits and care.   Keep all follow-up appointments.  SEEK MEDICAL CARE IF:   You have pain when you urinate.   You have to urinate frequently.   You have a fever.  SEEK IMMEDIATE MEDICAL CARE IF:   Your membranes rupture.  You go into labor. Document Released: 05/07/2007 Document Revised: 02/02/2013 Document Reviewed: 11/20/2012 [facility] Patient Information 2015 [place], [place]. This information is not intended to replace advice given to you by your health care provider. Make sure you discuss any questions you have with your health care provider. Levonorgestrel intrauterine device (IUD) What is this medicine? LEVONORGESTREL IUD (LEE voe nor jes trel) is a contraceptive (birth control) device. The device is placed inside the uterus by a healthcare professional. It is used to prevent pregnancy and can also be used to treat heavy bleeding that occurs during your period. Depending on the device, it  can be used for 3 to 5 years. This medicine may be used for other purposes; ask your health care provider or pharmacist if you have questions. COMMON BRAND NAME(S): Verda Cumins What should I tell my health care provider before I take this medicine? They need to know if you have any of these conditions: -abnormal Pap smear -cancer of the breast, uterus, or cervix -diabetes -endometritis -genital or pelvic infection now or in the past -have  more than one sexual partner or your partner has more than one partner -heart disease -history of an ectopic or tubal pregnancy -immune system problems -IUD in place -liver disease or tumor -problems with blood clots or take blood-thinners -use intravenous drugs -uterus of unusual shape -vaginal bleeding that has not been explained -an unusual or allergic reaction to levonorgestrel, other hormones, silicone, or polyethylene, medicines, foods, dyes, or preservatives -pregnant or trying to get pregnant -breast-feeding How should I use this medicine? This device is placed inside the uterus by a health care professional. Talk to your pediatrician regarding the use of this medicine in children. Special care may be needed. Overdosage: If you think you have taken too much of this medicine contact a poison control center or emergency room at once. NOTE: This medicine is only for you. Do not share this medicine with others. What if I miss a dose? This does not apply. What may interact with this medicine? Do not take this medicine with any of the following medications: -amprenavir -bosentan -fosamprenavir This medicine may also interact with the following medications: -aprepitant -barbiturate medicines for inducing sleep or treating seizures -bexarotene -griseofulvin -medicines to treat seizures like carbamazepine, ethotoin, felbamate, oxcarbazepine, phenytoin, topiramate -modafinil -pioglitazone -rifabutin -rifampin -rifapentine -some medicines to treat HIV infection like atazanavir, indinavir, lopinavir, nelfinavir, tipranavir, ritonavir -St. John's wort -warfarin This list may not describe all possible interactions. Give your health care provider a list of all the medicines, herbs, non-prescription drugs, or dietary supplements you use. Also tell them if you smoke, drink alcohol, or use illegal drugs. Some items may interact with your medicine. What should I watch for while using  this medicine? Visit your doctor or health care professional for regular check ups. See your doctor if you or your partner has sexual contact with others, becomes HIV positive, or gets a sexual transmitted disease. This product does not protect you against HIV infection (AIDS) or other sexually transmitted diseases. You can check the placement of the IUD yourself by reaching up to the top of your vagina with clean fingers to feel the threads. Do not pull on the threads. It is a good habit to check placement after each menstrual period. Call your doctor right away if you feel more of the IUD than just the threads or if you cannot feel the threads at all. The IUD may come out by itself. You may become pregnant if the device comes out. If you notice that the IUD has come out use a backup birth control method like condoms and call your health care provider. Using tampons will not change the position of the IUD and are okay to use during your period. What side effects may I notice from receiving this medicine? Side effects that you should report to your doctor or health care professional as soon as possible: -allergic reactions like skin rash, itching or hives, swelling of the face, lips, or tongue -fever, flu-like symptoms -genital sores -high blood pressure -no menstrual period for 6 weeks during use -pain, swelling,  warmth in the leg -pelvic pain or tenderness -severe or sudden headache -signs of pregnancy -stomach cramping -sudden shortness of breath -trouble with balance, talking, or walking -unusual vaginal bleeding, discharge -yellowing of the eyes or skin Side effects that usually do not require medical attention (report to your doctor or health care professional if they continue or are bothersome): -acne -breast pain -change in sex drive or performance -changes in weight -cramping, dizziness, or faintness while the device is being inserted -headache -irregular menstrual bleeding  within first 3 to 6 months of use -nausea This list may not describe all possible side effects. Call your doctor for medical advice about side effects. You may report side effects to FDA at 1-800-FDA-1088. Where should I keep my medicine? This does not apply. NOTE: This sheet is a summary. It may not cover all possible information. If you have questions about this medicine, talk to your doctor, pharmacist, or health care provider.  2015, [employer] Standard. (2011-02-28 13:54:04)

## 2013-10-27 LAB — GC/CHLAMYDIA PROBE AMP
CT PROBE, AMP APTIMA: NEGATIVE
GC PROBE AMP APTIMA: NEGATIVE

## 2013-10-28 ENCOUNTER — Encounter: Payer: Self-pay | Admitting: Obstetrics & Gynecology

## 2013-10-28 LAB — CULTURE, BETA STREP (GROUP B ONLY)

## 2013-11-01 ENCOUNTER — Inpatient Hospital Stay (HOSPITAL_COMMUNITY)
Admission: AD | Admit: 2013-11-01 | Discharge: 2013-11-01 | Disposition: A | Discharge status: Home / Self Care | Payer: Medicaid Other | Source: Ambulatory Visit | Attending: Family Medicine | Admitting: Family Medicine

## 2013-11-01 ENCOUNTER — Telehealth: Payer: Self-pay | Admitting: *Deleted

## 2013-11-01 ENCOUNTER — Encounter (HOSPITAL_COMMUNITY): Payer: Self-pay | Admitting: *Deleted

## 2013-11-01 DIAGNOSIS — O9989 Other specified diseases and conditions complicating pregnancy, childbirth and the puerperium: Principal | ICD-10-CM

## 2013-11-01 DIAGNOSIS — O133 Gestational [pregnancy-induced] hypertension without significant proteinuria, third trimester: Secondary | ICD-10-CM

## 2013-11-01 DIAGNOSIS — E860 Dehydration: Secondary | ICD-10-CM | POA: Diagnosis not present

## 2013-11-01 DIAGNOSIS — R197 Diarrhea, unspecified: Secondary | ICD-10-CM

## 2013-11-01 DIAGNOSIS — O99891 Other specified diseases and conditions complicating pregnancy: Secondary | ICD-10-CM | POA: Insufficient documentation

## 2013-11-01 DIAGNOSIS — O139 Gestational [pregnancy-induced] hypertension without significant proteinuria, unspecified trimester: Secondary | ICD-10-CM | POA: Diagnosis present

## 2013-11-01 LAB — CBC
HCT: 32.7 % — ABNORMAL LOW (ref 36.0–46.0)
HEMOGLOBIN: 11.2 g/dL — AB (ref 12.0–15.0)
MCH: 30.1 pg (ref 26.0–34.0)
MCHC: 34.3 g/dL (ref 30.0–36.0)
MCV: 87.9 fL (ref 78.0–100.0)
Platelets: 175 10*3/uL (ref 150–400)
RBC: 3.72 MIL/uL — ABNORMAL LOW (ref 3.87–5.11)
RDW: 13.4 % (ref 11.5–15.5)
WBC: 13.1 10*3/uL — ABNORMAL HIGH (ref 4.0–10.5)

## 2013-11-01 LAB — COMPREHENSIVE METABOLIC PANEL
ALK PHOS: 90 U/L (ref 39–117)
ALT: 8 U/L (ref 0–35)
ANION GAP: 16 — AB (ref 5–15)
AST: 11 U/L (ref 0–37)
Albumin: 2.5 g/dL — ABNORMAL LOW (ref 3.5–5.2)
BUN: 10 mg/dL (ref 6–23)
CO2: 20 meq/L (ref 19–32)
Calcium: 8 mg/dL — ABNORMAL LOW (ref 8.4–10.5)
Chloride: 100 mEq/L (ref 96–112)
Creatinine, Ser: 0.77 mg/dL (ref 0.50–1.10)
GFR calc non Af Amer: >90 mL/min (ref >90)
Glucose, Bld: 85 mg/dL (ref 70–99)
POTASSIUM: 2.8 meq/L — AB (ref 3.7–5.3)
Sodium: 136 mEq/L — ABNORMAL LOW (ref 137–147)
Total Protein: 6.4 g/dL (ref 6.0–8.3)

## 2013-11-01 LAB — URINALYSIS, ROUTINE W REFLEX MICROSCOPIC
Bilirubin Urine: NEGATIVE
Glucose, UA: NEGATIVE mg/dL
HGB URINE DIPSTICK: NEGATIVE
Ketones, ur: NEGATIVE mg/dL
NITRITE: NEGATIVE
Protein, ur: NEGATIVE mg/dL
Specific Gravity, Urine: 1.02 (ref 1.005–1.030)
UROBILINOGEN UA: 0.2 mg/dL (ref 0.0–1.0)
pH: 6 (ref 5.0–8.0)

## 2013-11-01 LAB — PROTEIN / CREATININE RATIO, URINE
Creatinine, Urine: 240.58 mg/dL
Protein Creatinine Ratio: 0.16 — ABNORMAL HIGH (ref 0.00–0.15)
Total Protein, Urine: 38.4 mg/dL

## 2013-11-01 LAB — LACTATE DEHYDROGENASE: LDH: 168 U/L (ref 94–250)

## 2013-11-01 LAB — URINE MICROSCOPIC-ADD ON

## 2013-11-01 LAB — URIC ACID: Uric Acid, Serum: 4.4 mg/dL (ref 2.4–7.0)

## 2013-11-01 MED ORDER — ONDANSETRON 4 MG PO TBDP: 4.0000 mg | ORAL_TABLET | Freq: Three times a day (TID) | ORAL | Status: DC | PRN

## 2013-11-01 MED ORDER — ONDANSETRON 4 MG PO TBDP
4.0000 mg | ORAL_TABLET | Freq: Once | ORAL | Status: AC
  Administered 2013-11-01: 4 mg via ORAL
  Filled 2013-11-01: qty 1

## 2013-11-01 MED ORDER — POTASSIUM CHLORIDE 20 MEQ PO PACK: 60.0000 meq | PACK | Freq: Once | ORAL | Status: DC

## 2013-11-01 MED ORDER — LACTATED RINGERS IV BOLUS (SEPSIS)
1000.0000 mL | Freq: Once | INTRAVENOUS | Status: AC
  Administered 2013-11-01: 1000 mL via INTRAVENOUS

## 2013-11-01 MED ORDER — POTASSIUM CHLORIDE ER 10 MEQ PO TBCR: 60.0000 meq | EXTENDED_RELEASE_TABLET | Freq: Every day | ORAL | Status: DC

## 2013-11-01 NOTE — MAU Note (Signed)
Critical value potassium 2.8 called to Dr Mancel Bale

## 2013-11-01 NOTE — Telephone Encounter (Signed)
Kelsye called twice this morning and left 2 messages. States she has been very sick since last night, has been in and out of restroom and is feeling very dehydrated.  States she is calling to make sure everything is ok and can she take a medicine because of the baby.  Called second time and said she is starting to feel light headed and weak.  Called [place] and she states the diarrhea is continuing, she is not vomiting but feels it in back of throat if she burps. States she feels light headed and dizzy. Does confirm her daughter was sick with vomiting and diarrhea Thursday/ Friday of last week , but states mine is much worse than hers was.  She states the baby is moving well.  I advised her to have someone bring her to MAU for evaluation today asap as it sounds like she is dehydrated and may need fluids.I advised her to keep trying to eat chips of ice and sips of liquids until she arrives.  She states her sister will bring her this afternoon.

## 2013-11-01 NOTE — MAU Provider Note (Signed)
Chief Complaint:  Diarrhea   First Provider Initiated Contact with Patient 11/01/13 1557      HPI: Kelly Fry is a 28 y.o. G3P1011 at [redacted]w[redacted]d who presents to maternity admissions reporting diarrhea and dehydration.  She reports since last PM, around 8:30 PM, she has been having very frequent diarrhea. Describes diarrhea as green-brown, non-bloody and without mucous. Has not had fever, no nausea, but does endorse decreased appetite. Has not eaten anything today due to lack of appetite, but has been able to drink water and Dr. Malachi Bonds. Endorses normal UOP. Denies vomiting. States she is having diarrhea about every 20 minutes and did not get any sleep last night due to this. Despite good PO intake with fluids, she feels dizzy when standing up.   Reports her daughter recently had similar symptoms 4-5 days ago which lasted for 2 days. She also woke up this AM with vomiting and diarrhea. Her boyfriend has also had similar symptoms, along with the nine people he shares a house with.  Denies contractions, leakage of fluid or vaginal bleeding. Good fetal movement. No HA, blurry vision. Mild orthostasis.  Denies abdominal pain, dysuria, flank pain.  Pregnancy Course:  RH neg.  Past Medical History: Past Medical History  Diagnosis Date  . Medical history non-contributory     Past obstetric history: OB History  Gravida Para Term Preterm AB SAB TAB Ectopic Multiple Living  3 1 1  0 1 1 0 0 0 1    # Outcome Date GA Lbr Len/2nd Weight Sex Delivery Anes PTL Lv  3 CUR           2 SAB 2012          1 TRM 01/30/06 [redacted]w[redacted]d  7 lb 8 oz (3.402 kg) F SVD   Y     Comments: No complications       Past Surgical History: Past Surgical History  Procedure Laterality Date  . No past surgeries       Family History: Family History  Problem Relation Age of Onset  . Kidney disease Father   . Kidney disease Sister     Social History: History  Substance Use Topics  . Smoking status: Never Smoker   .  Smokeless tobacco: Never Used  . Alcohol Use: No    Allergies: No Known Allergies  Meds:  Facility-administered medications prior to admission  Medication Dose Route Frequency Provider Last Rate Last Dose  . Tdap (BOOSTRIX) injection 0.5 mL  0.5 mL Intramuscular Once Lavonia Drafts, MD       Prescriptions prior to admission  Medication Sig Dispense Refill  . Elastic Bandages & Supports (COMFORT FIT MATERNITY SUPP LG) MISC 1 Device by Does not apply route daily.  1 each  0  . Prenatal Vit-Fe Fumarate-FA (PRENATAL MULTIVITAMIN) TABS tablet Take 1 tablet by mouth daily at 12 noon.        ROS: Pertinent findings in history of present illness.  Physical Exam  Blood pressure 160/86, pulse 91, temperature 98.9 F (37.2 C), temperature source Oral, resp. rate 16, height 5\' 5"  (1.651 m), weight 246 lb 3.2 oz (111.676 kg), last menstrual period 02/21/2013, SpO2 99.00%. GENERAL: Well-developed, well-nourished female in no acute distress. Remarkably well appearing. HEENT: normocephalic. Dry mucous membranes. HEART: normal rate RESP: normal effort ABDOMEN: Soft, non-tender, gravid appropriate for gestational age EXTREMITIES: Nontender, no edema NEURO: alert and oriented SPECULUM EXAM: Not indicated.     FHT:  Baseline 135 , moderate variability, accelerations present, no  decelerations Contractions: quiet   Labs: No results found for this or any previous visit (from the past 24 hour(s)).  Imaging:  No results found. MAU Course:  28 yo G3P1011 @ [redacted]w[redacted]d who presents with diarrhea and dehydration. Upon presentation, remarkably well appearing, but with dry mucous membranes and reports of orthostasis.  U/A was positive for small LE, thus sent for UCx.  She remained afebrile in MAU. VSS without hypotension or tachycardia.   Original BP elevated, to 160/86, several repeats w/ diastolic > 80 and systolic in 672C. Given previous elevated diastolic BPs to 94B, diagnosed w/ gHTN. HELLP  labs WNL, but UP:C 0.16 and thus sent home w/ 24 hr urine protein.  She received 2 bolus of IVF and zofran with ability to tolerate PO (crackers and soda). She did not vomit while in MAU and had no episodes of diarrhea. No evidence of contractions, fetal distress, or fetal tachycardia. She was found to have hypokalemia, c/w viral gastro and was given 60 mEq potassium. She will get re-check with PCP.  Suspect viral gastroenteritis given lack of vomiting, afebrile, and multiple exposures with similar symptoms. Gave instructions re: hydration and return precautions if ongoing > 24 houts, febrile, signs of dehydration, or dec fetal movement.   Assessment: No diagnosis found.  Plan: Discharge home Labor precautions and fetal kick counts Pre-eclampsia precuations    Medication List    ASK your doctor about these medications       COMFORT FIT MATERNITY SUPP LG Misc  1 Device by Does not apply route daily.     prenatal multivitamin Tabs tablet  Take 1 tablet by mouth daily at 12 noon.        Josephine Cables, MD 11/01/2013 4:24 PM

## 2013-11-01 NOTE — MAU Note (Signed)
Patient states she has had diarrhea about every 20 minutes since yesterday morning. Denies vomiting, pain, bleeding or leaking. States is moving a lot more than usual.

## 2013-11-02 LAB — URINE CULTURE

## 2013-11-03 NOTE — MAU Provider Note (Signed)
Attestation of Attending Supervision of Obstetric Fellow: Evaluation and management procedures were performed by the Obstetric Fellow under my supervision and collaboration.  I have reviewed the Obstetric Fellow's note and chart, and I agree with the management and plan.  Thorsten Climer, DO Attending Physician Faculty Practice, [facility]  

## 2013-11-09 ENCOUNTER — Ambulatory Visit (INDEPENDENT_AMBULATORY_CARE_PROVIDER_SITE_OTHER): Payer: Medicaid Other | Admitting: Obstetrics & Gynecology

## 2013-11-09 ENCOUNTER — Telehealth: Payer: Self-pay

## 2013-11-09 VITALS — BP 136/94 | HR 84 | Temp 98.4°F | Wt 250.7 lb

## 2013-11-09 DIAGNOSIS — O133 Gestational [pregnancy-induced] hypertension without significant proteinuria, third trimester: Secondary | ICD-10-CM

## 2013-11-09 DIAGNOSIS — O139 Gestational [pregnancy-induced] hypertension without significant proteinuria, unspecified trimester: Secondary | ICD-10-CM

## 2013-11-09 LAB — COMPREHENSIVE METABOLIC PANEL
ALT: 9 U/L (ref 0–35)
AST: 11 U/L (ref 0–37)
Albumin: 3.1 g/dL — ABNORMAL LOW (ref 3.5–5.2)
Alkaline Phosphatase: 107 U/L (ref 39–117)
BILIRUBIN TOTAL: 0.3 mg/dL (ref 0.2–1.2)
BUN: 5 mg/dL — ABNORMAL LOW (ref 6–23)
CALCIUM: 8.3 mg/dL — AB (ref 8.4–10.5)
CHLORIDE: 105 meq/L (ref 96–112)
CO2: 24 meq/L (ref 19–32)
Creat: 0.73 mg/dL (ref 0.50–1.10)
GLUCOSE: 69 mg/dL — AB (ref 70–99)
Potassium: 3.5 mEq/L (ref 3.5–5.3)
SODIUM: 139 meq/L (ref 135–145)
Total Protein: 5.8 g/dL — ABNORMAL LOW (ref 6.0–8.3)

## 2013-11-09 LAB — POCT URINALYSIS DIP (DEVICE)
Bilirubin Urine: NEGATIVE
Glucose, UA: NEGATIVE mg/dL
Hgb urine dipstick: NEGATIVE
Nitrite: NEGATIVE
PROTEIN: NEGATIVE mg/dL
Specific Gravity, Urine: 1.015 (ref 1.005–1.030)
UROBILINOGEN UA: 0.2 mg/dL (ref 0.0–1.0)
pH: 7 (ref 5.0–8.0)

## 2013-11-09 NOTE — Progress Notes (Signed)
24 hr urine returned today. Start testing 2/week and order ultrasound.

## 2013-11-09 NOTE — Patient Instructions (Signed)

## 2013-11-09 NOTE — Telephone Encounter (Signed)
Pt. Dropped off 24 hour urine and left visit before blood could be drawn. CMP drawn 8 days ago-- cannot be used today. Patient needs labs drawn today or 24 hour urine will need to be re-collected. Attempted to call patient with both numbers listed. No voicemail box set up. Unable to leave message. Will try again.

## 2013-11-09 NOTE — Telephone Encounter (Signed)
Patient returned to clinic-- blood drawn for 24hr urine.

## 2013-11-09 NOTE — Progress Notes (Signed)
Patient reports pain when baby moves

## 2013-11-10 LAB — CREATININE CLEARANCE, URINE, 24 HOUR
Creatinine Clearance: 130 mL/min — ABNORMAL HIGH (ref 75–115)
Creatinine, 24H Ur: 1633 mg/d (ref 700–1800)
Creatinine, Urine: 81.6 mg/dL
Creatinine: 0.87 mg/dL (ref 0.50–1.10)

## 2013-11-10 LAB — PROTEIN, URINE, 24 HOUR
PROTEIN 24H UR: 520 mg/d — AB (ref <150)
PROTEIN, URINE: 26 mg/dL — AB (ref 5–24)

## 2013-11-12 ENCOUNTER — Ambulatory Visit (INDEPENDENT_AMBULATORY_CARE_PROVIDER_SITE_OTHER): Payer: Medicaid Other | Admitting: *Deleted

## 2013-11-12 ENCOUNTER — Telehealth (HOSPITAL_COMMUNITY): Payer: Self-pay | Admitting: *Deleted

## 2013-11-12 VITALS — BP 152/82 | HR 69

## 2013-11-12 DIAGNOSIS — O133 Gestational [pregnancy-induced] hypertension without significant proteinuria, third trimester: Secondary | ICD-10-CM

## 2013-11-12 LAB — POCT URINALYSIS DIP (DEVICE)
Bilirubin Urine: NEGATIVE
Glucose, UA: 100 mg/dL — AB
HGB URINE DIPSTICK: NEGATIVE
Ketones, ur: NEGATIVE mg/dL
Nitrite: NEGATIVE
PROTEIN: NEGATIVE mg/dL
SPECIFIC GRAVITY, URINE: 1.015 (ref 1.005–1.030)
Urobilinogen, UA: 0.2 mg/dL (ref 0.0–1.0)
pH: 7 (ref 5.0–8.0)

## 2013-11-12 NOTE — Telephone Encounter (Signed)
Preadmission screen  

## 2013-11-12 NOTE — Progress Notes (Signed)
Pt denies H/A or visual disturbances. Dr. Roselie Awkward notified of lab results from 9/29, NST and BP results from today.  Plan of care: IOL @ 38wks, scheduled on 10/4 @ 1930.  Pt advised of plan of care and discussed IOL process by myself and Delories Heinz, RNC

## 2013-11-14 ENCOUNTER — Inpatient Hospital Stay (HOSPITAL_COMMUNITY): Admission: RE | Admit: 2013-11-14 | Payer: Medicaid Other | Source: Ambulatory Visit

## 2013-11-14 ENCOUNTER — Encounter (HOSPITAL_COMMUNITY): Payer: Self-pay

## 2013-11-14 ENCOUNTER — Inpatient Hospital Stay (HOSPITAL_COMMUNITY)
Admission: AD | Admit: 2013-11-14 | Discharge: 2013-11-18 | DRG: 775 | Disposition: A | Discharge status: Home / Self Care | Payer: Medicaid Other | Source: Ambulatory Visit | Attending: Obstetrics & Gynecology | Admitting: Obstetrics & Gynecology

## 2013-11-14 DIAGNOSIS — O360930 Maternal care for other rhesus isoimmunization, third trimester, not applicable or unspecified: Secondary | ICD-10-CM | POA: Diagnosis present

## 2013-11-14 DIAGNOSIS — Z3A38 38 weeks gestation of pregnancy: Secondary | ICD-10-CM | POA: Diagnosis present

## 2013-11-14 DIAGNOSIS — O139 Gestational [pregnancy-induced] hypertension without significant proteinuria, unspecified trimester: Secondary | ICD-10-CM | POA: Diagnosis present

## 2013-11-14 DIAGNOSIS — O133 Gestational [pregnancy-induced] hypertension without significant proteinuria, third trimester: Principal | ICD-10-CM | POA: Diagnosis present

## 2013-11-14 LAB — COMPREHENSIVE METABOLIC PANEL
ALT: 10 U/L (ref 0–35)
ANION GAP: 14 (ref 5–15)
AST: 13 U/L (ref 0–37)
Albumin: 2.7 g/dL — ABNORMAL LOW (ref 3.5–5.2)
Alkaline Phosphatase: 121 U/L — ABNORMAL HIGH (ref 39–117)
BUN: 6 mg/dL (ref 6–23)
CALCIUM: 8.7 mg/dL (ref 8.4–10.5)
CO2: 21 mEq/L (ref 19–32)
Chloride: 103 mEq/L (ref 96–112)
Creatinine, Ser: 0.78 mg/dL (ref 0.50–1.10)
GFR calc Af Amer: >90 mL/min (ref >90)
Glucose, Bld: 74 mg/dL (ref 70–99)
Potassium: 3.6 mEq/L — ABNORMAL LOW (ref 3.7–5.3)
Sodium: 138 mEq/L (ref 137–147)
TOTAL PROTEIN: 6.4 g/dL (ref 6.0–8.3)
Total Bilirubin: <0.2 mg/dL — ABNORMAL LOW (ref 0.3–1.2)

## 2013-11-14 LAB — CBC
HCT: 34.4 % — ABNORMAL LOW (ref 36.0–46.0)
Hemoglobin: 11.7 g/dL — ABNORMAL LOW (ref 12.0–15.0)
MCH: 29.8 pg (ref 26.0–34.0)
MCHC: 34 g/dL (ref 30.0–36.0)
MCV: 87.8 fL (ref 78.0–100.0)
Platelets: 188 K/uL (ref 150–400)
RBC: 3.92 MIL/uL (ref 3.87–5.11)
RDW: 13.4 % (ref 11.5–15.5)
WBC: 8.7 K/uL (ref 4.0–10.5)

## 2013-11-14 LAB — PROTEIN / CREATININE RATIO, URINE
Creatinine, Urine: 107.92 mg/dL
PROTEIN CREATININE RATIO: 0.2 — AB (ref 0.00–0.15)
Total Protein, Urine: 21.4 mg/dL

## 2013-11-14 MED ORDER — LABETALOL HCL 5 MG/ML IV SOLN: 5.0000 mg | INTRAVENOUS | Status: DC | PRN

## 2013-11-14 NOTE — Progress Notes (Signed)
Notified of pt arrival in MAU. Requesting orders to start pt care. Received orders to put pt on monitor and cycle BP. Will come see pt

## 2013-11-14 NOTE — H&P (Signed)
Kelly Fry is a 28 y.o. female G3P1011 at [redacted]w[redacted]d  presenting for IOL for GHTN.  Lab work significant on 9/29 for normal LFTs, 24hr urine protein 520mg . No complaints currently, +FM, no VB, no LOF, no contractions. denies HA, scotoma, RUQ pain.   Rhogam received at 29 wks Clinic  low risk  Dating   20 wk [place]  Genetic Screen  Declined  Anatomic [place] Limited heart views at 20 week anatomy scan, repeat in 4-6 weeks > nml  GTT Early:      128         Third trimester: 137 > need 3 hr>72-125-119-91 nml  TDaP vaccine  Declined  Flu vaccine Declined  GBS 10/26/2013: negative  Contraception  Mirena  Baby Food  Breast  Circumcision  yes but it is a girl  Pediatrician  Undecided  Support Person  Boyfriend  CF negative   Maternal Medical History:  Fetal activity: Perceived fetal activity is normal.   Last perceived fetal movement was within the past hour.    Prenatal Complications - Diabetes: none.    OB History   Grav Para Term Preterm Abortions TAB SAB Ect Mult Living   3 1 1  0 1 0 1 0 0 1    2007 - SVD 7#8oz.  2012- SAB (unknown #weeks)  Past Medical History  Diagnosis Date  . Medical history non-contributory    Past Surgical History  Procedure Laterality Date  . No past surgeries     Family History: family history includes Kidney disease in her father and sister. Social History:  reports that she has never smoked. She has never used smokeless tobacco. She reports that she does not drink alcohol or use illicit drugs.   Prenatal Transfer Tool  Maternal Diabetes: No Genetic Screening: Declined Maternal Ultrasounds/Referrals: Normal Fetal Ultrasounds or other Referrals:  None Maternal Substance Abuse:  No Significant Maternal Medications:  None Significant Maternal Lab Results:  Lab values include: Group B Strep negative, Rh negative Other Comments:  None  ROS    Blood pressure 164/98, pulse 73, temperature 98.1 F (36.7 C), temperature source Oral, resp. rate 18,  height 5\' 4"  (1.626 m), weight 251 lb (113.853 kg), last menstrual period 02/21/2013, SpO2 100.00%.   Fetal Exam Fetal Monitor Review: Mode: ultrasound.   Baseline rate: 135.  Variability: moderate (6-25 bpm).   Pattern: accelerations present and no decelerations.    Fetal State Assessment: Category I - tracings are normal.     Physical Exam  Constitutional: She is oriented to person, place, and time. She appears well-developed and well-nourished.  HENT:  Head: Normocephalic and atraumatic.  Cardiovascular: Normal rate, regular rhythm, normal heart sounds and intact distal pulses.   No murmur heard. Respiratory: Effort normal and breath sounds normal.  GI: Soft. There is no tenderness.  Neurological: She is alert and oriented to person, place, and time.  Skin: Skin is warm and dry.  Psychiatric: Her behavior is normal.    Prenatal labs: ABO, Rh: A/NEG/-- (04/21 0956) Antibody: NEG (04/21 0956) Rubella: 1.74 (04/21 0956) RPR: NON REAC (07/15 1040)  HBsAg: NEGATIVE (04/21 0956)  HIV: NONREACTIVE (07/15 1040)  GBS: Negative (09/15 0000)   Assessment/Plan: Kelly Fry is a 28 y.o. G3P1011 at [redacted]w[redacted]d by 20wk [place] here for IOL for GHTN  Elevated BPs in MAU, ordered PRN labetalol, PIH labs, urine pr/cr 0.20  #Labor: IOL, will check cervix once down to L&D, 1cm at 35wks so cytotec vs FB #Pain: No epidural #  FWB: Cat 1 #ID:  GBS negative  #MOF: Breast #MOC: Mirena #Circ:  No, female  Pediatrician: undecided   T[place]anna Sat 11/14/2013, 8:51 PM  I have seen this patient and agree with the above resident's note.  [place], [place] Certified Nurse-Midwife

## 2013-11-14 NOTE — MAU Note (Signed)
Pt presents for a scheduled induction for gestation hypertension. Denies vaginal bleeding, leaking or discharge. Reports good fetal movement. Denies contractions.

## 2013-11-15 ENCOUNTER — Encounter (HOSPITAL_COMMUNITY): Payer: Self-pay | Admitting: *Deleted

## 2013-11-15 DIAGNOSIS — O360930 Maternal care for other rhesus isoimmunization, third trimester, not applicable or unspecified: Secondary | ICD-10-CM | POA: Diagnosis present

## 2013-11-15 DIAGNOSIS — O133 Gestational [pregnancy-induced] hypertension without significant proteinuria, third trimester: Secondary | ICD-10-CM | POA: Diagnosis present

## 2013-11-15 DIAGNOSIS — Z3A38 38 weeks gestation of pregnancy: Secondary | ICD-10-CM | POA: Diagnosis present

## 2013-11-15 LAB — TYPE AND SCREEN
ABO/RH(D): A NEG
ANTIBODY SCREEN: NEGATIVE

## 2013-11-15 LAB — RPR

## 2013-11-15 MED ORDER — TERBUTALINE SULFATE 1 MG/ML IJ SOLN: 0.2500 mg | Freq: Once | INTRAMUSCULAR | Status: AC | PRN

## 2013-11-15 MED ORDER — OXYCODONE-ACETAMINOPHEN 5-325 MG PO TABS: 1.0000 | ORAL_TABLET | ORAL | Status: DC | PRN

## 2013-11-15 MED ORDER — OXYTOCIN 40 UNITS IN LACTATED RINGERS INFUSION - SIMPLE MED: 62.5000 mL/h | INTRAVENOUS | Status: DC

## 2013-11-15 MED ORDER — MISOPROSTOL 25 MCG QUARTER TABLET
25.0000 ug | ORAL_TABLET | ORAL | Status: DC | PRN
  Administered 2013-11-15: 25 ug via VAGINAL
  Filled 2013-11-15: qty 0.25
  Filled 2013-11-15: qty 1

## 2013-11-15 MED ORDER — FLEET ENEMA 7-19 GM/118ML RE ENEM: 1.0000 | ENEMA | RECTAL | Status: DC | PRN

## 2013-11-15 MED ORDER — ONDANSETRON HCL 4 MG/2ML IJ SOLN: 4.0000 mg | Freq: Four times a day (QID) | INTRAMUSCULAR | Status: DC | PRN

## 2013-11-15 MED ORDER — OXYTOCIN BOLUS FROM INFUSION
500.0000 mL | INTRAVENOUS | Status: DC
  Administered 2013-11-16: 500 mL via INTRAVENOUS

## 2013-11-15 MED ORDER — OXYTOCIN 40 UNITS IN LACTATED RINGERS INFUSION - SIMPLE MED
1.0000 m[IU]/min | INTRAVENOUS | Status: DC
Start: 2013-11-15 — End: 2013-11-16
  Administered 2013-11-15: 2 m[IU]/min via INTRAVENOUS
  Filled 2013-11-15: qty 1000

## 2013-11-15 MED ORDER — FENTANYL CITRATE 0.05 MG/ML IJ SOLN
50.0000 ug | Freq: Once | INTRAMUSCULAR | Status: AC
  Administered 2013-11-15: 50 ug via INTRAVENOUS
  Filled 2013-11-15: qty 2

## 2013-11-15 MED ORDER — LACTATED RINGERS IV SOLN
INTRAVENOUS | Status: DC
  Administered 2013-11-15 (×3): via INTRAVENOUS

## 2013-11-15 MED ORDER — CITRIC ACID-SODIUM CITRATE 334-500 MG/5ML PO SOLN: 30.0000 mL | ORAL | Status: DC | PRN

## 2013-11-15 MED ORDER — ACETAMINOPHEN 325 MG PO TABS: 650.0000 mg | ORAL_TABLET | ORAL | Status: DC | PRN

## 2013-11-15 MED ORDER — OXYCODONE-ACETAMINOPHEN 5-325 MG PO TABS: 2.0000 | ORAL_TABLET | ORAL | Status: DC | PRN

## 2013-11-15 MED ORDER — LACTATED RINGERS IV SOLN: 500.0000 mL | INTRAVENOUS | Status: DC | PRN

## 2013-11-15 MED ORDER — LIDOCAINE HCL (PF) 1 % IJ SOLN
30.0000 mL | INTRAMUSCULAR | Status: DC | PRN
  Filled 2013-11-15: qty 30

## 2013-11-15 NOTE — Progress Notes (Signed)
Patient ID: Kelly Fry, female   DOB: November 01, 1985, 28 y.o.   MRN: [id number]  Labor Progress Note  ASSESSMENT:   Kelly Fry 28 y.o. G3P1011 at [redacted]w[redacted]d in induced labor for gHTN   PLAN:  1) Labor curve reviewed.       Progress: Induced; Latent stage             Plan: Place cytotec now  2) Fetal heart tracing reviewed.   - Cat I, reassuring - No interventions needed  3) GBS Status - NEG  4) Other Problems Active Problems:   Gestational hypertension - BP now currently moderate - mild range. No BP > 160/100 - UP:C 0.2; HELLP WNL - Asymptomatic  SUBJECTIVE:  Doing well. Not feeling contractions. Denies HAs, scotoma, blurry vision, SOB, or N/V.   OBJECTIVE:  Vital Signs:  Filed Vitals:   11/15/13 0200 11/15/13 0300 11/15/13 0400 11/15/13 0723  BP:    147/88  Pulse:    82  Temp:    98.2 F (36.8 C)  TempSrc:    Oral  Resp: 20 18 20 18   Height:      Weight:      SpO2:         SVE: Dilation: 2.5, Effacement (%): 30, Station: -2  FHR Monitoring Baseline Rate (A): 140 bpm/moderate/+accels/no decels   Accelerations: 15 x 15 Contraction Frequency (min): 5-7

## 2013-11-15 NOTE — Progress Notes (Signed)
Patient ID: TONEE SILVERSTEIN, female   DOB: Jun 06, 1985, 28 y.o.   MRN: [id number] Patient ID: SABEEN PIECHOCKI, female   DOB: Dec 31, 1985, 28 y.o.   MRN: [id number]  Labor Progress Note  ASSESSMENT:   CUCA BENASSI 28 y.o. G3P1011 at [redacted]w[redacted]d in induced labor for gHTN   PLAN:  1) Labor curve reviewed.       Progress: Induced; Latent stage             Plan: Start Pitocin  2) Fetal heart tracing reviewed.   - Cat I, reassuring - No interventions needed  3) GBS Status - NEG  4) Other Problems Active Problems:   Gestational hypertension - BP now currently moderate - mild range. No BP > 160/100 - UP:C 0.2; HELLP WNL - Asymptomatic  SUBJECTIVE:  Doing well. Not feeling contractions, but toco showing ctxns q 2-3 min. Denies HAs, scotoma, blurry vision, SOB, or N/V.   OBJECTIVE:  Vital Signs:  Filed Vitals:   11/15/13 0400 11/15/13 0723 11/15/13 0933 11/15/13 1040  BP:  147/88 147/90 147/95  Pulse:  82 80 74  Temp:  98.2 F (36.8 C)    TempSrc:  Oral    Resp: 20 18 20    Height:      Weight:      SpO2:         SVE: Dilation: 3, Effacement (%): 30, Station: -2  FHR Monitoring Baseline Rate (A): 135 bpm/moderate/+accels/no decels   Accelerations: 15 x 15 Contraction Frequency (min): 1-5

## 2013-11-15 NOTE — Progress Notes (Signed)
Patient ID: Kelly Fry, female   DOB: 1985/09/07, 28 y.o.   MRN: [id number]  Labor Progress Note  ASSESSMENT:   Kelly Fry 28 y.o. G3P1011 at [redacted]w[redacted]d in induced labor for gHTN   PLAN:  1) Labor curve reviewed.       Progress: Induced; Latent stage             Plan: Continue Pitocin; currently at 4   2) Fetal heart tracing reviewed.   - Cat I, reassuring - No interventions needed  3) GBS Status - NEG  4) Other Problems Active Problems:   Gestational hypertension - BP now currently moderate - mild range. No BP > 160/100 - UP:C 0.2; HELLP WNL - Asymptomatic  SUBJECTIVE:  Doing well. Not feeling contractions. Denies HAs, scotoma, blurry vision, SOB, or N/V.   OBJECTIVE:  Vital Signs:  Filed Vitals:   11/15/13 1600 11/15/13 1630 11/15/13 1700 11/15/13 1730  BP: 147/88 146/88 146/85 145/86  Pulse: 75 72 70 74  Temp: 98.5 F (36.9 C)     TempSrc: Oral     Resp: 18   20  Height:      Weight:      SpO2:         SVE: Dilation: 3, Effacement (%): 30, Station: -2; deferred at this check  FHR Monitoring Baseline Rate (A): 135 bpm/moderate/+accels/no decels   Accelerations: 15 x 15 Contraction Frequency (min): 1-3

## 2013-11-15 NOTE — Progress Notes (Signed)
Patient ID: Kelly Fry, female   DOB: 09-30-1985, 28 y.o.   MRN: [id number]  Labor Progress Note  ASSESSMENT:   Kelly Fry 28 y.o. G3P1011 at [redacted]w[redacted]d in induced labor for gHTN   PLAN:  1) Labor curve reviewed.       Progress: Induced; Latent stage             Plan: Continue Pitocin; currently at 8  2) Fetal heart tracing reviewed.   - Cat I, reassuring - No interventions needed  3) GBS Status - NEG  4) Other Problems Active Problems:   Gestational hypertension - BP now currently moderate - mild range. No BP > 160/100 - PRN labetalol ordered - UP:C 0.2; HELLP WNL - Asymptomatic  SUBJECTIVE:  Doing well. Was feeling contractions, but says they have "eased up." Denies HAs, scotoma, blurry vision, SOB, or N/V.   OBJECTIVE:  Vital Signs:  Filed Vitals:   11/15/13 1800 11/15/13 1830 11/15/13 1904 11/15/13 1930  BP: 132/92 139/95 151/85 140/92  Pulse: 73 68 68 78  Temp:   98.7 F (37.1 C)   TempSrc:   Oral   Resp: 18  20 18   Height:      Weight:      SpO2:         SVE: Dilation: 4, Effacement (%): 30, Station: -2; deferred at this check  FHR Monitoring Baseline Rate (A): 130 bpm/moderate/+accels/no decels   Accelerations: 15 x 15;10 x 10 Contraction Frequency (min): 2-5

## 2013-11-15 NOTE — Plan of Care (Signed)
Problem: Consults Goal: Birthing Suites Patient Information Press F2 to bring up selections list   Pt 37-[redacted] weeks EGA     

## 2013-11-15 NOTE — H&P (Signed)
Attestation of Attending Supervision of Advanced Practitioner (CNM/NP): Evaluation and management procedures were performed by the Advanced Practitioner under my supervision and collaboration.  I have reviewed the Advanced Practitioner's note and chart, and I agree with the management and plan.  Katisha Shimizu 11/15/2013 7:12 AM

## 2013-11-15 NOTE — Progress Notes (Signed)
Patient ID: Kelly Fry, female   DOB: [birth year], 28 y.o.   MRN: [id number]  Labor Progress Note  ASSESSMENT:   Kelly Fry 28 y.o. G3P1011 at [redacted]w[redacted]d in induced labor for gHTN   PLAN:  1) Labor curve reviewed.       Progress: Induced; Latent stage             Plan: Continue Pitocin; currently at 14  2) Fetal heart tracing reviewed.   - Cat I, reassuring - No interventions needed  3) GBS Status - NEG  4) Other Problems Active Problems:   Gestational hypertension - BP now currently moderate - mild range. No BP > 160/100 - PRN labetalol ordered - UP:C 0.2; HELLP WNL - Asymptomatic  SUBJECTIVE:  Doing well. Feeling mild contractions. Denies HAs, scotoma, blurry vision, SOB, or N/V.   OBJECTIVE:  Vital Signs:  Filed Vitals:   11/15/13 2100 11/15/13 2130 11/15/13 2200 11/15/13 2230  BP: 136/88 141/88 151/82 157/83  Pulse: 65 74 66 59  Temp:      TempSrc:      Resp: 18 20 20 18   Height:      Weight:      SpO2:         SVE: Dilation: 4, Effacement (%): 30, Station: -2; deferred at this check  FHR Monitoring Baseline Rate (A): 140 bpm/moderate/+accels/no decels   Accelerations: 15 x 15 Contraction Frequency (min): 2-4

## 2013-11-16 ENCOUNTER — Other Ambulatory Visit: Payer: Medicaid Other

## 2013-11-16 ENCOUNTER — Encounter (HOSPITAL_COMMUNITY): Payer: Self-pay | Admitting: General Practice

## 2013-11-16 ENCOUNTER — Ambulatory Visit (HOSPITAL_COMMUNITY): Payer: Medicaid Other

## 2013-11-16 DIAGNOSIS — Z3A38 38 weeks gestation of pregnancy: Secondary | ICD-10-CM

## 2013-11-16 DIAGNOSIS — O133 Gestational [pregnancy-induced] hypertension without significant proteinuria, third trimester: Secondary | ICD-10-CM

## 2013-11-16 LAB — CULTURE, OB URINE: Colony Count: >=100000

## 2013-11-16 MED ORDER — OXYCODONE-ACETAMINOPHEN 5-325 MG PO TABS
1.0000 | ORAL_TABLET | ORAL | Status: DC | PRN
  Administered 2013-11-16: 0.5 via ORAL
  Administered 2013-11-16: 1 via ORAL
  Administered 2013-11-17: 0.5 via ORAL
  Administered 2013-11-18: 1 via ORAL
  Filled 2013-11-16 (×4): qty 1

## 2013-11-16 MED ORDER — ONDANSETRON HCL 4 MG/2ML IJ SOLN: 4.0000 mg | INTRAMUSCULAR | Status: DC | PRN

## 2013-11-16 MED ORDER — ZOLPIDEM TARTRATE 5 MG PO TABS: 5.0000 mg | ORAL_TABLET | Freq: Every evening | ORAL | Status: DC | PRN

## 2013-11-16 MED ORDER — SIMETHICONE 80 MG PO CHEW: 80.0000 mg | CHEWABLE_TABLET | ORAL | Status: DC | PRN

## 2013-11-16 MED ORDER — BENZOCAINE-MENTHOL 20-0.5 % EX AERO
1.0000 "application " | INHALATION_SPRAY | CUTANEOUS | Status: DC | PRN
  Administered 2013-11-16: 1 via TOPICAL
  Filled 2013-11-16: qty 56

## 2013-11-16 MED ORDER — PRENATAL MULTIVITAMIN CH
1.0000 | ORAL_TABLET | Freq: Every day | ORAL | Status: DC
  Administered 2013-11-16 – 2013-11-18 (×3): 1 via ORAL
  Filled 2013-11-16 (×3): qty 1

## 2013-11-16 MED ORDER — ONDANSETRON HCL 4 MG PO TABS: 4.0000 mg | ORAL_TABLET | ORAL | Status: DC | PRN

## 2013-11-16 MED ORDER — DIBUCAINE 1 % RE OINT: 1.0000 "application " | TOPICAL_OINTMENT | RECTAL | Status: DC | PRN

## 2013-11-16 MED ORDER — IBUPROFEN 600 MG PO TABS
600.0000 mg | ORAL_TABLET | Freq: Four times a day (QID) | ORAL | Status: DC
  Administered 2013-11-16 – 2013-11-18 (×10): 600 mg via ORAL
  Filled 2013-11-16 (×10): qty 1

## 2013-11-16 MED ORDER — SENNOSIDES-DOCUSATE SODIUM 8.6-50 MG PO TABS
2.0000 | ORAL_TABLET | ORAL | Status: DC
  Administered 2013-11-16 – 2013-11-18 (×2): 2 via ORAL
  Filled 2013-11-16 (×2): qty 2

## 2013-11-16 MED ORDER — FENTANYL CITRATE 0.05 MG/ML IJ SOLN: 50.0000 ug | INTRAMUSCULAR | Status: DC | PRN

## 2013-11-16 MED ORDER — WITCH HAZEL-GLYCERIN EX PADS: 1.0000 "application " | MEDICATED_PAD | CUTANEOUS | Status: DC | PRN

## 2013-11-16 MED ORDER — LANOLIN HYDROUS EX OINT: TOPICAL_OINTMENT | CUTANEOUS | Status: DC | PRN

## 2013-11-16 MED ORDER — TETANUS-DIPHTH-ACELL PERTUSSIS 5-2.5-18.5 LF-MCG/0.5 IM SUSP
0.5000 mL | Freq: Once | INTRAMUSCULAR | Status: DC
Start: 2013-11-17 — End: 2013-11-18

## 2013-11-16 MED ORDER — DIPHENHYDRAMINE HCL 25 MG PO CAPS: 25.0000 mg | ORAL_CAPSULE | Freq: Four times a day (QID) | ORAL | Status: DC | PRN

## 2013-11-16 MED ORDER — OXYCODONE-ACETAMINOPHEN 5-325 MG PO TABS: 2.0000 | ORAL_TABLET | ORAL | Status: DC | PRN

## 2013-11-16 NOTE — Progress Notes (Signed)
UR chart review completed.  

## 2013-11-16 NOTE — Progress Notes (Signed)
Kelly Fry is a 28 y.o. G3P1011 at [redacted]w[redacted]d by 20wk ultrasound admitted for induction of labor due to gHypertension.  Subjective: Patient in pain, urge to push with every contraction, now requesting IV pain medication and epidural.  Objective: BP 149/85  Pulse 270  Temp(Src) 98 F (36.7 C) (Oral)  Resp 18  Ht 5\' 4"  (1.626 m)  Wt 251 lb (113.853 kg)  BMI 43.06 kg/m2  SpO2 100%  LMP 02/21/2013      FHT:  FHR: 130 bpm, variability: moderate,  accelerations:  Present,  decelerations:  Absent UC:   regular, every 2-3 minutes SVE:   Dilation: 4.5 Effacement (%): 90 Station: -1 Exam by:: Archie Balboa time 0100  Labs: Lab Results  Component Value Date   WBC 8.7 11/14/2013   HGB 11.7* 11/14/2013   HCT 34.4* 11/14/2013   MCV 87.8 11/14/2013   PLT 188 11/14/2013    Assessment / Plan: Induction of labor due to gestational hypertension,  progressing well on pitocin SROM at 0030, clear. Complete and will start pushing now  Labor: Progressing on Pitocin Preeclampsia:  labetalol PRN (0 doses given), BPs 140-150s/80-90s most of day Fetal Wellbeing:  Category I Pain Control:  requesting pain meds and epidural (will require recheck of labs, unlikely to get with advanced cerv exam and urge to push) I/D:  n/a Anticipated MOD:  NSVD  Kelly Fry 11/16/2013, 1:44 AM

## 2013-11-16 NOTE — Lactation Note (Signed)
This note was copied from the chart of Girl Valorie Roosevelt. Lactation Consultation Note  Patient Name: Girl Karishma Unrein [id number] Date: 11/16/2013 Reason for consult: Initial assessment Baby 14 hours of life. Mom report bf first child, now age 28, 3 months with no problems, stopped because she was young and had to return to school. Mom reports nursing going very well, baby latching deeply and nursing at least 20 minutes every few hours. Mom states she is motivated to BF this child "as long as she can." Enc mom to nurse with cues, maintain a deep latch and call for assistance as needed. Mom given [facility] brochure, aware of OP/BFSG and community resources.    Maternal Data Has patient been taught Hand Expression?: Yes Does the patient have breastfeeding experience prior to this delivery?: Yes  Feeding    LATCH Score/Interventions                      Lactation Tools Discussed/Used     Consult Status Consult Status: PRN    Inocente Salles 11/16/2013, 4:34 PM

## 2013-11-16 NOTE — Progress Notes (Signed)
Patient ID: Kelly Fry, female   DOB: 04-07-1985, 28 y.o.   MRN: [id number] NST reactive

## 2013-11-17 LAB — CBC
HCT: 28.3 % — ABNORMAL LOW (ref 36.0–46.0)
Hemoglobin: 9.4 g/dL — ABNORMAL LOW (ref 12.0–15.0)
MCH: 29.7 pg (ref 26.0–34.0)
MCHC: 33.2 g/dL (ref 30.0–36.0)
MCV: 89.6 fL (ref 78.0–100.0)
PLATELETS: 159 10*3/uL (ref 150–400)
RBC: 3.16 MIL/uL — AB (ref 3.87–5.11)
RDW: 13.7 % (ref 11.5–15.5)
WBC: 10.2 10*3/uL (ref 4.0–10.5)

## 2013-11-17 MED ORDER — RHO D IMMUNE GLOBULIN 1500 UNIT/2ML IJ SOSY
300.0000 ug | PREFILLED_SYRINGE | Freq: Once | INTRAMUSCULAR | Status: AC
  Administered 2013-11-17: 300 ug via INTRAMUSCULAR
  Filled 2013-11-17: qty 2

## 2013-11-17 NOTE — Lactation Note (Signed)
This note was copied from the chart of Kelly Valorie Roosevelt. Lactation Consultation Note Follow up visit at 21 hours of age.  Mom reports good feedings, but baby doesn't open mouth wide.  Baby observed to have small mouth.  Last stool was at midnight last night almost 24 hours ago, with several good feedings noted.  Assisted with football hold on right breast.  Mom allowed shallow latch and showed her how to break latch with finger in side of mouth.  Minimal assist to latch baby with wide open mouth and rolled lower lip out to flange.  Baby has strong suck with good jaw excursions noted.  Several swallows heard.  Encouraged mom to work on depth and to call for assist as needed.    Patient Name: Kelly Fry [id number]'Q Date: 11/17/2013 Reason for consult: Follow-up assessment   Maternal Data    Feeding Feeding Type: Breast Fed Length of feed:  (observed 10 minutes)  LATCH Score/Interventions Latch: Grasps breast easily, tongue down, lips flanged, rhythmical sucking.  Audible Swallowing: Spontaneous and intermittent  Type of Nipple: Everted at rest and after stimulation  Comfort (Breast/Nipple): Soft / non-tender     Hold (Positioning): Assistance needed to correctly position infant at breast and maintain latch. Intervention(s): Skin to skin;Position options;Support Pillows;Breastfeeding basics reviewed  LATCH Score: 9  Lactation Tools Discussed/Used     Consult Status Consult Status: Follow-up Follow-up type: In-patient    Aysel Gilchrest, Justine Null 11/17/2013, 10:26 PM

## 2013-11-17 NOTE — Progress Notes (Signed)
Post Partum Day 1 Subjective: no complaints, up ad lib, voiding, tolerating PO and + flatus States was told she will stay through Thursday "to watch my BPs".  Objective: Blood pressure 141/82, pulse 64, temperature 98.2 F (36.8 C), temperature source Oral, resp. rate 18, height 5\' 4"  (1.626 m), weight 251 lb (113.853 kg), last menstrual period 02/21/2013, SpO2 98.00%, unknown if currently breastfeeding.  Physical Exam:  General: alert, cooperative and no distress Lochia: appropriate Uterine Fundus: firm Incision: No sutures DVT Evaluation: No evidence of DVT seen on physical exam. Trace to 1+ edema DTRs 1+./no clonus   Recent Labs  11/14/13 2200  HGB 11.7*  HCT 34.4*    Assessment/Plan: Plan for discharge tomorrow and Contraception Mirena   LOS: 3 days   [facility] 11/17/2013, 5:52 AM

## 2013-11-17 NOTE — Progress Notes (Signed)
Clinical Social Work Department PSYCHOSOCIAL ASSESSMENT - MATERNAL/CHILD 11/17/2013  Patient:  AMBYR, QADRI  Account Number:  [id number]  Admit Date:  11/14/2013  Ardine Eng Name:   Cora Collum   Clinical Social Worker:  Lucita Ferrara, CLINICAL SOCIAL WORKER   Date/Time:  11/17/2013 09:30 AM  Date Referred:  11/16/2013   Referral source  Physician     Referred reason  [facility]   Other referral source:    I:  FAMILY / HOME ENVIRONMENT Child's legal guardian:  PARENT  Guardian - Name [place] - Age Guardian - Address  Helma Arg[place], [place] [address]  Kimber Relic  same as above   Other household support members/support persons Name Relationship DOB  Cottie Banda 2007   Other support:   MOB stated that she is well supported and identified her sisters and her mother as members of her primary support system.    II  PSYCHOSOCIAL DATA Information Source:  Patient Interview  Insurance risk surveyor Resources Employment:   MOB stated that she will be staying at home with Terrence Dupont.  She shared that the FOB is employed.   Financial resources:  Medicaid If Medicaid - County:  [place] / Grade:  N/A Music therapist / Child Services Coordination / Early Interventions:   N/A  Cultural issues impacting care:   None reported.    III  STRENGTHS Strengths  Adequate Resources  Home prepared for Child (including basic supplies)  Supportive family/friends   Strength comment:    IV  RISK FACTORS AND CURRENT PROBLEMS Current Problem:  YES   Risk Factor & Current Problem Patient Issue Family Issue Risk Factor / Current Problem Comment  Other - See comment Y N MD noted insufficient prenatal care during her second trimester.    V  SOCIAL WORK ASSESSMENT CSW met with the MOB in her room in order to complete the assessment. Consult was ordered due to MD noting insufficient prenatal care during her second  trimester.  MOB was easily engaged and receptive to the visit.  She displayed a full range range in presented in an euthymic mood.  She openly answered questions and was observed to be attentive and bonding with the baby throughout the visit.    MOB expressed excitement upon the birth of her daughter. She also expressed anticipation/eagerness to return ,but verbalized awareness of needing to stay in the hospital for one more day due to her gestational hypertension.  Per MOB, she is well supported by her family and friends, and discussed how they have helped ensure that they have everything they need for the baby.  She stated that she was originally overwhelmed since the baby came earlier than planned, but that the family and the FOB have been able to ensure that the home is prepared for their arrival. CSW and MOB discussed at length about normative behaviors and feelings that her 53 year old daughter may experience as she transitions to being a big sister after the MOB disclosed that her first daughter experienced a difficult transition after the MOB and her ex-husband separated/divorced.  MOB presents with awareness of how her daughter may cope and is open to the idea of assisting her daughter to receive therapy if her behaviors warranted additional support.   CSW continued to assist MOB process her feelings related to recent psychosocial changes/stressors since MOB identified separation/divorce from her ex-husband, falling in love with the FOB at a time  when she wanted to remain single, an unplanned pregnancy, and then moving into the own place together.  MOB reflected upon past feelings of sadness, frustration, and being overwhelmed, but was able to share with the CSW how she has been able to work through these feelings and cope from her past.  MOB shared that she feels well supported by the FOB and her family has even noted that she has "never been happier".  MOB was also guided to process her feelings  related to disengaging from her work identity as she will not be returning to work in the postpartum period.  She stated that it was a joint decision with the FOB, and reflected upon how she initially was not looking forward to staying at home since she enjoyed working, but now has transitioned to a state of excitement since she will be able to spend her time with her baby and be present more with her 38 year old.  CSW normalized the adjustment period that she experienced, and what she may experience in the future as she adjusts to having two children.    MOB denied mental health history, and denied history of postpartum depression.  She did express some feeling of being overwhelmed after the birth of her first daughter, but she also acknowledged that she was only 28 years old, the FOB was not involved, and she was a first time parent.  MOB stated that she has few concerns about her mental health as she transitions into the postpartum period, but verbalized intention to monitor for postpartum depression.  CSW normalized increase in anxiety as a mother of a newborn, and MOB presented with self-awareness that she has already started to worry more since she is responsible for the care of her.   CSW inquired about prenatal care.  MOB stated that she did not present for first prenatal appointment until she was 18 weeks since she did not know that she was pregnant.  Per MOB, she suspected that she was pregnant, but did not take a pregnant test for a few weeks since she was in a state of denial.  MOB shared that she and the FOB were originally overwhelmed since it was unplanned, but that they quickly became excited.  MOB stated that she did not miss any prenatal appointments, and denied any gaps in care.  MOB denied any barriers to attending follow-up appointments and stated that she has access to transportation.    No barriers to discharge.    VI SOCIAL WORK PLAN Social Work Therapist, art  No  Further Intervention Required / No Barriers to Discharge   Type of pt/family education:   Postpartum depression   If child protective services report - county:   If child protective services report - date:   Information/referral to community resources comment:   No referrals needed at this time.   Other social work plan:   CSW to provide ongoing emotional support PRN.

## 2013-11-18 LAB — RH IG WORKUP (INCLUDES ABO/RH)
ABO/RH(D): A NEG
FETAL SCREEN: NEGATIVE
Gestational Age(Wks): 38
Unit division: 0

## 2013-11-18 NOTE — Discharge Summary (Signed)
Obstetric Discharge Summary Reason for Admission: induction of labor secondary to gHTN Prenatal Procedures: none Intrapartum Procedures: spontaneous vaginal delivery Postpartum Procedures: none Complications-Operative and Postpartum: none  Hospital Course: Patient presented for IOL secondary to gHTN. She progressed well on cytotec and pitocin and delivered via NSVD. Her gHTN was controlled with prn labetalol and she had no elevated BPs after delivery.   Delivery Note  PRE-OPERATIVE DIAGNOSIS:  1) [redacted]w[redacted]d pregnancy.  POST-OPERATIVE DIAGNOSIS:  1) [redacted]w[redacted]d pregnancy s/p Vaginal, Spontaneous Delivery  Delivery Type: Vaginal, Spontaneous Delivery  Delivery Clinician: ROBERTS, CAROLINE  Delivery Anesthesia: None  Labor Complications: None  Lacerations: None  ESTIMATED BLOOD LOSS: 300  Labor course: This is a 28 y.o. y.o. female [id number] who came in at [redacted]w[redacted]d pregnancy for IOL 2/2 gHTN. Her prenatal course was complicated by gHTN. Initial cervical exam was 2.5/30/-2. She was admitted to L and D. Labor course included:  Cytotec x 1  Pitocin  SROM  Procedure: Vaginal, Spontaneous Delivery  Date of birth: 11/16/2011  Time of birth: 2:03 AM  This [id number] woman under no anesthesia delivered a viable female infant weighing Weight: 7 lb 10 oz (3.459 kg) (Filed from Delivery Summary) with Apgars as listed below. Delivery was via NSVD  Delivery completed and cord cut and clamped. Infant dried and stimulated. Infant to maternal abdomen. Cord pH not obtained. Active management of the third stage of labor performed. Intact placenta delivered spontaneously at 10/6 2:06 AM . Vagina and rectum explored and small periurethreal abrasions Noted with good approximation of tissue and hemostasis. Uterus well contracted at end of delivery. Mother and infant tolerated delivery well.  FINDINGS:  1) female infant, Apgar scores of 9 at 1 minute  9 at 5 minutes  2) 3 Vessel Cord  3) Nuchal: Yes, loose  SPECIMENS:  Placenta discarded; Cord gases not obtained  COMPLICATIONS: none  DISPOSITION:  Infant to NBN     H/H: Lab Results  Component Value Date/Time   HGB 9.4* 11/17/2013  5:50 AM   HCT 28.3* 11/17/2013  5:50 AM    Filed Vitals:   11/18/13 0545  BP: 128/79  Pulse: 70  Temp: 97.7 F (36.5 C)  Resp: 18    Physical Exam: VSS NAD Abd: Appropriately tender, ND, Fundus firm and below umbilicus No c/c/e, Neg homan's sign, neg cords Lochia Appropriate  Discharge Diagnoses: Term Pregnancy-delivered  Discharge Information: Date: 08/23/2010 Activity: pelvic rest Diet: routine  Medications: PNV Breast feeding:  Yes Condition: stable Instructions: refer to handout Discharge to: home      Medication List         prenatal multivitamin Tabs tablet  Take 1 tablet by mouth daily at 12 noon.       Follow-up Information   Follow up with [facility] In 6 weeks. (for follow up)    Specialty:  Obstetrics and Gynecology   Contact information:   [place] [address] [phone]      Dimas Chyle 11/18/2013,9:20 AM  I have seen and examined this patient and I agree with the above. Serita Grammes CNM 8:42 AM 11/20/2013

## 2013-11-18 NOTE — Discharge Instructions (Signed)

## 2013-11-20 NOTE — Discharge Summary (Signed)
`````  Attestation of Attending Supervision of Advanced Practitioner: Evaluation and management procedures were performed by the PA/NP/CNM/OB Fellow under my supervision/collaboration. Chart reviewed and agree with management and plan.  Vonzell Lindblad V 11/20/2013 7:59 PM

## 2013-11-23 ENCOUNTER — Telehealth: Payer: Self-pay

## 2013-11-23 DIAGNOSIS — O2343 Unspecified infection of urinary tract in pregnancy, third trimester: Secondary | ICD-10-CM

## 2013-11-23 MED ORDER — NITROFURANTOIN MONOHYD MACRO 100 MG PO CAPS: 100.0000 mg | ORAL_CAPSULE | Freq: Two times a day (BID) | ORAL | Status: DC

## 2013-11-23 NOTE — Telephone Encounter (Signed)
Contacted patient and informed of need for medication for UTI.  Pt verbalized understanding.

## 2013-11-23 NOTE — Telephone Encounter (Signed)
macrobid 100mg  BID X 7days e-prescribed for UTI. Attempted to call patient-- husband answered and stated patient is not available-- asked that he have patient call when she gets in-- stated he would.

## 2013-11-23 NOTE — Telephone Encounter (Signed)
Message copied by Geanie Logan on Tue Nov 23, 2013  1:34 PM ------      Message from: Woodroe Mode      Created: Tue Nov 23, 2013 12:12 PM       macrobid 100 mg bid 7 days ------

## 2013-12-13 ENCOUNTER — Encounter (HOSPITAL_COMMUNITY): Payer: Self-pay | Admitting: General Practice

## 2013-12-24 ENCOUNTER — Encounter: Payer: Self-pay | Admitting: Nurse Practitioner

## 2013-12-24 ENCOUNTER — Ambulatory Visit (INDEPENDENT_AMBULATORY_CARE_PROVIDER_SITE_OTHER): Payer: Medicaid Other | Admitting: Nurse Practitioner

## 2013-12-24 VITALS — BP 129/88 | HR 86 | Temp 98.4°F | Ht 64.0 in | Wt 230.9 lb

## 2013-12-24 DIAGNOSIS — Z308 Encounter for other contraceptive management: Secondary | ICD-10-CM

## 2013-12-24 DIAGNOSIS — Z01812 Encounter for preprocedural laboratory examination: Secondary | ICD-10-CM

## 2013-12-24 DIAGNOSIS — Z309 Encounter for contraceptive management, unspecified: Secondary | ICD-10-CM | POA: Insufficient documentation

## 2013-12-24 LAB — POCT PREGNANCY, URINE: Preg Test, Ur: NEGATIVE

## 2013-12-24 MED ORDER — LEVONORGESTREL 20 MCG/24HR IU IUD
INTRAUTERINE_SYSTEM | Freq: Once | INTRAUTERINE | Status: AC
Start: 2013-12-24 — End: 2013-12-24
  Administered 2013-12-24: 1 via INTRAUTERINE

## 2013-12-24 NOTE — Progress Notes (Signed)
Patient here for PP visit. Desires Mirena. Reports having protected intercourse with a condom 4 days ago. UPT obtained.

## 2013-12-24 NOTE — Patient Instructions (Signed)
Levonorgestrel intrauterine device (IUD) What is this medicine? LEVONORGESTREL IUD (LEE voe nor jes trel) is a contraceptive (birth control) device. The device is placed inside the uterus by a healthcare professional. It is used to prevent pregnancy and can also be used to treat heavy bleeding that occurs during your period. Depending on the device, it can be used for 3 to 5 years. This medicine may be used for other purposes; ask your health care provider or pharmacist if you have questions. COMMON BRAND NAME(S): LILETTA, Mirena, Skyla What should I tell my health care provider before I take this medicine? They need to know if you have any of these conditions: -abnormal Pap smear -cancer of the breast, uterus, or cervix -diabetes -endometritis -genital or pelvic infection now or in the past -have more than one sexual partner or your partner has more than one partner -heart disease -history of an ectopic or tubal pregnancy -immune system problems -IUD in place -liver disease or tumor -problems with blood clots or take blood-thinners -use intravenous drugs -uterus of unusual shape -vaginal bleeding that has not been explained -an unusual or allergic reaction to levonorgestrel, other hormones, silicone, or polyethylene, medicines, foods, dyes, or preservatives -pregnant or trying to get pregnant -breast-feeding How should I use this medicine? This device is placed inside the uterus by a health care professional. Talk to your pediatrician regarding the use of this medicine in children. Special care may be needed. Overdosage: If you think you have taken too much of this medicine contact a poison control center or emergency room at once. NOTE: This medicine is only for you. Do not share this medicine with others. What if I miss a dose? This does not apply. What may interact with this medicine? Do not take this medicine with any of the following  medications: -amprenavir -bosentan -fosamprenavir This medicine may also interact with the following medications: -aprepitant -barbiturate medicines for inducing sleep or treating seizures -bexarotene -griseofulvin -medicines to treat seizures like carbamazepine, ethotoin, felbamate, oxcarbazepine, phenytoin, topiramate -modafinil -pioglitazone -rifabutin -rifampin -rifapentine -some medicines to treat HIV infection like atazanavir, indinavir, lopinavir, nelfinavir, tipranavir, ritonavir -St. John's wort -warfarin This list may not describe all possible interactions. Give your health care provider a list of all the medicines, herbs, non-prescription drugs, or dietary supplements you use. Also tell them if you smoke, drink alcohol, or use illegal drugs. Some items may interact with your medicine. What should I watch for while using this medicine? Visit your doctor or health care professional for regular check ups. See your doctor if you or your partner has sexual contact with others, becomes HIV positive, or gets a sexual transmitted disease. This product does not protect you against HIV infection (AIDS) or other sexually transmitted diseases. You can check the placement of the IUD yourself by reaching up to the top of your vagina with clean fingers to feel the threads. Do not pull on the threads. It is a good habit to check placement after each menstrual period. Call your doctor right away if you feel more of the IUD than just the threads or if you cannot feel the threads at all. The IUD may come out by itself. You may become pregnant if the device comes out. If you notice that the IUD has come out use a backup birth control method like condoms and call your health care provider. Using tampons will not change the position of the IUD and are okay to use during your period. What side effects may   I notice from receiving this medicine? Side effects that you should report to your doctor or  health care professional as soon as possible: -allergic reactions like skin rash, itching or hives, swelling of the face, lips, or tongue -fever, flu-like symptoms -genital sores -high blood pressure -no menstrual period for 6 weeks during use -pain, swelling, warmth in the leg -pelvic pain or tenderness -severe or sudden headache -signs of pregnancy -stomach cramping -sudden shortness of breath -trouble with balance, talking, or walking -unusual vaginal bleeding, discharge -yellowing of the eyes or skin Side effects that usually do not require medical attention (report to your doctor or health care professional if they continue or are bothersome): -acne -breast pain -change in sex drive or performance -changes in weight -cramping, dizziness, or faintness while the device is being inserted -headache -irregular menstrual bleeding within first 3 to 6 months of use -nausea This list may not describe all possible side effects. Call your doctor for medical advice about side effects. You may report side effects to FDA at 1-800-FDA-1088. Where should I keep my medicine? This does not apply. NOTE: This sheet is a summary. It may not cover all possible information. If you have questions about this medicine, talk to your doctor, pharmacist, or health care provider.  2015, Elsevier/Gold Standard. (2011-02-28 13:54:04) IUD PLACEMENT POST-PROCEDURE INSTRUCTIONS  1. You may take Ibuprofen, Aleve or Tylenol for pain if needed.  Cramping should resolve within in 24 hours.  2. You may have a small amount of spotting.  You should wear a mini pad for the next few days.  3. You may have intercourse after 24 hours.  If you using this for birth control, it is effective immediately.  4. You need to call if you have any pelvic pain, fever, heavy bleeding or foul smelling vaginal discharge.  Irregular bleeding is common the first several months after having an IUD placed. You do not need to call for  this reason unless you are concerned.  5. Shower or bathe as normal  6. You should have a follow-up appointment in 4-8 weeks for a re-check to make sure you are not having any problems.

## 2013-12-24 NOTE — Progress Notes (Signed)
Patient ID: Kelly Fry, female   DOB: [birth year], 28 y.o.   MRN: [id number] Subjective:     Kelly Fry is a 28 y.o. female who presents for a postpartum visit. She is 6 weeks postpartum following a spontaneous vaginal delivery. I have fully reviewed the prenatal and intrapartum course. The delivery was at 38.2 gestational weeks. Outcome: spontaneous vaginal delivery. Anesthesia: none. Postpartum course has been uncomplicated. Baby's course has been uncomplicated. Baby is feeding by breast. Bleeding no bleeding. Bowel function is normal. Bladder function is normal. Patient is sexually active. Contraception method is IUD. Postpartum depression screening: negative.    Objective:    BP 129/88 mmHg  Pulse 86  Temp(Src) 98.4 F (36.9 C) (Oral)  Ht 5\' 4"  (1.626 m)  Wt 230 lb 14.4 oz (104.736 kg)  BMI 39.61 kg/m2  Breastfeeding? Yes  General:  alert and cooperative  Lungs: clear to auscultation bilaterally  Peripheral:  No lower extremity edema bilaterally. No calf tenderness.   Heart:  regular rate and rhythm, S1, S2 normal, no murmur, click, rub or gallop  Abdomen: soft, non-tender; bowel sounds normal; no masses,  no organomegaly   Vulva:  normal  Vagina: normal vagina, no discharge, exudate, lesion, or erythema  Cervix:  no cervical motion tenderness  Adnexa:  normal adnexa  Rectal Exam: Not performed.         Results for orders placed or performed in visit on 12/24/13 (from the past 24 hour(s))  Pregnancy, urine POC     Status: None   Collection Time: 12/24/13  9:18 AM  Result Value Ref Range   Preg Test, Ur NEGATIVE NEGATIVE     IUD Procedure Note/ Mirenia   Patient identified, informed consent performed, signed copy in chart, time out was performed.  Urine pregnancy test negative.  Speculum placed in the vagina.  Cervix visualized.  Cleaned with Betadine x 2.  Grasped anteriorly with a single tooth tenaculum.  Uterus sounded to 6 cm.  Mirena IUD placed per  manufacturer's recommendations.  Strings trimmed to 3 cm. Tenaculum was removed, good hemostasis noted.  Patient tolerated procedure well.   Patient given post procedure instructions  Patient is asked to check IUD strings periodically and follow up in 4-6 weeks for IUD check.    Assessment:   - Postpartum exam. Pap smear not done at today's visit.   - Contraception management   Plan:    1. Contraception: IUD. Advised patient to avoid intercourse or use backup contraception for 2 weeks.  2. Instructed patient to take Ibuprofen 800mg  with food for pain following IUD insertion  3. Instructed patient to call if experiencing severe cramping or heavy bleeding such as changing pads more than 1x/hour for several hours. 4. Follow up in: 6 weeks for string check or as needed.

## 2014-01-27 ENCOUNTER — Encounter: Payer: Self-pay | Admitting: *Deleted

## 2014-02-07 ENCOUNTER — Ambulatory Visit (INDEPENDENT_AMBULATORY_CARE_PROVIDER_SITE_OTHER): Payer: Medicaid Other | Admitting: Obstetrics & Gynecology

## 2014-02-07 ENCOUNTER — Encounter: Payer: Self-pay | Admitting: Obstetrics & Gynecology

## 2014-02-07 VITALS — BP 133/97 | HR 85 | Temp 98.7°F | Wt 236.7 lb

## 2014-02-07 DIAGNOSIS — Z30431 Encounter for routine checking of intrauterine contraceptive device: Secondary | ICD-10-CM

## 2014-02-07 NOTE — Progress Notes (Signed)
Pt reports partner can feel the strings during intercourse.

## 2014-02-07 NOTE — Patient Instructions (Signed)
Levonorgestrel intrauterine device (IUD) What is this medicine? LEVONORGESTREL IUD (LEE voe nor jes trel) is a contraceptive (birth control) device. The device is placed inside the uterus by a healthcare professional. It is used to prevent pregnancy and can also be used to treat heavy bleeding that occurs during your period. Depending on the device, it can be used for 3 to 5 years. This medicine may be used for other purposes; ask your health care provider or pharmacist if you have questions. COMMON BRAND NAME(S): LILETTA, Mirena, Skyla What should I tell my health care provider before I take this medicine? They need to know if you have any of these conditions: -abnormal Pap smear -cancer of the breast, uterus, or cervix -diabetes -endometritis -genital or pelvic infection now or in the past -have more than one sexual partner or your partner has more than one partner -heart disease -history of an ectopic or tubal pregnancy -immune system problems -IUD in place -liver disease or tumor -problems with blood clots or take blood-thinners -use intravenous drugs -uterus of unusual shape -vaginal bleeding that has not been explained -an unusual or allergic reaction to levonorgestrel, other hormones, silicone, or polyethylene, medicines, foods, dyes, or preservatives -pregnant or trying to get pregnant -breast-feeding How should I use this medicine? This device is placed inside the uterus by a health care professional. Talk to your pediatrician regarding the use of this medicine in children. Special care may be needed. Overdosage: If you think you have taken too much of this medicine contact a poison control center or emergency room at once. NOTE: This medicine is only for you. Do not share this medicine with others. What if I miss a dose? This does not apply. What may interact with this medicine? Do not take this medicine with any of the following  medications: -amprenavir -bosentan -fosamprenavir This medicine may also interact with the following medications: -aprepitant -barbiturate medicines for inducing sleep or treating seizures -bexarotene -griseofulvin -medicines to treat seizures like carbamazepine, ethotoin, felbamate, oxcarbazepine, phenytoin, topiramate -modafinil -pioglitazone -rifabutin -rifampin -rifapentine -some medicines to treat HIV infection like atazanavir, indinavir, lopinavir, nelfinavir, tipranavir, ritonavir -St. John's wort -warfarin This list may not describe all possible interactions. Give your health care provider a list of all the medicines, herbs, non-prescription drugs, or dietary supplements you use. Also tell them if you smoke, drink alcohol, or use illegal drugs. Some items may interact with your medicine. What should I watch for while using this medicine? Visit your doctor or health care professional for regular check ups. See your doctor if you or your partner has sexual contact with others, becomes HIV positive, or gets a sexual transmitted disease. This product does not protect you against HIV infection (AIDS) or other sexually transmitted diseases. You can check the placement of the IUD yourself by reaching up to the top of your vagina with clean fingers to feel the threads. Do not pull on the threads. It is a good habit to check placement after each menstrual period. Call your doctor right away if you feel more of the IUD than just the threads or if you cannot feel the threads at all. The IUD may come out by itself. You may become pregnant if the device comes out. If you notice that the IUD has come out use a backup birth control method like condoms and call your health care provider. Using tampons will not change the position of the IUD and are okay to use during your period. What side effects may   I notice from receiving this medicine? Side effects that you should report to your doctor or  health care professional as soon as possible: -allergic reactions like skin rash, itching or hives, swelling of the face, lips, or tongue -fever, flu-like symptoms -genital sores -high blood pressure -no menstrual period for 6 weeks during use -pain, swelling, warmth in the leg -pelvic pain or tenderness -severe or sudden headache -signs of pregnancy -stomach cramping -sudden shortness of breath -trouble with balance, talking, or walking -unusual vaginal bleeding, discharge -yellowing of the eyes or skin Side effects that usually do not require medical attention (report to your doctor or health care professional if they continue or are bothersome): -acne -breast pain -change in sex drive or performance -changes in weight -cramping, dizziness, or faintness while the device is being inserted -headache -irregular menstrual bleeding within first 3 to 6 months of use -nausea This list may not describe all possible side effects. Call your doctor for medical advice about side effects. You may report side effects to FDA at 1-800-FDA-1088. Where should I keep my medicine? This does not apply. NOTE: This sheet is a summary. It may not cover all possible information. If you have questions about this medicine, talk to your doctor, pharmacist, or health care provider.  2015, Elsevier/Gold Standard. (2011-02-28 13:54:04)  

## 2014-02-07 NOTE — Progress Notes (Signed)
Patient ID: Kelly Fry, female   DOB: Sep 28, 1985, 28 y.o.   MRN: [id number] History:  28 y.o. [id number] here today for today for IUD string check; Mirena IUD was placed 12/2013. No complaints about the Mirena, no concerning side effects.  The following portions of the patient's history were reviewed and updated as appropriate: allergies, current medications, past family history, past medical history, past social history, past surgical history and problem list.   Review of Systems:  Pertinent items are noted in HPI.  Objective:  Physical Exam Blood pressure 133/97, pulse 85, temperature 98.7 F (37.1 C), weight 236 lb 11.2 oz (107.366 kg), currently breastfeeding. Gen: NAD Abd: Soft, nontender and nondistended Pelvic: Normal appearing external genitalia; normal appearing vaginal mucosa and cervix.  IUD strings visualized, about 5 cm in length outside cervix. Trimmed to 3cm  Assessment & Plan:  Normal IUD check. Patient to keep IUD in place for five years; can come in for removal if she desires pregnancy within the next five years. Routine preventative health maintenance measures emphasized.

## 2014-12-21 ENCOUNTER — Ambulatory Visit (INDEPENDENT_AMBULATORY_CARE_PROVIDER_SITE_OTHER): Payer: Medicaid Other | Admitting: Pediatrics

## 2014-12-21 ENCOUNTER — Encounter: Payer: Self-pay | Admitting: Pediatrics

## 2014-12-21 VITALS — BP 135/94 | HR 106 | Temp 99.4°F | Ht 64.0 in | Wt 255.0 lb

## 2014-12-21 DIAGNOSIS — Z6841 Body Mass Index (BMI) 40.0 and over, adult: Secondary | ICD-10-CM | POA: Diagnosis not present

## 2014-12-21 DIAGNOSIS — Z308 Encounter for other contraceptive management: Secondary | ICD-10-CM | POA: Diagnosis not present

## 2014-12-21 DIAGNOSIS — Z0000 Encounter for general adult medical examination without abnormal findings: Secondary | ICD-10-CM

## 2014-12-21 NOTE — Patient Instructions (Signed)
Walk 30 mintues 5 days a week Eat three times/meals a day, no snacking Follow up with me in 2 months Schedule appointment with Tammy to talk about weight loss and nutrition

## 2014-12-21 NOTE — Progress Notes (Addendum)
  Subjective:    Patient ID: Kelly Fry, female    DOB: 06/13/1985, 29 y.o.   MRN: [id number]  CC: f/u multiple med problems  HPI: Kelly Fry is a 29 y.o. female presenting on 12/21/2014 for New Patient (Initial Visit) Here with husband Kelly Fry and daughter Kelly Fry (1yo) Had pain for a couple of days last week in lower abd/ pelvic area, would last for a couple of minutes, if got up and moved around would slowly go away. Pain went away after that, not sure what improved it. Has never had before, hasnt had any pain since. Regular bowel movements. Normal appetite. No diarrhea or pain with moving Mirena Placed Dec 25, 2013 Periods regular, light, last three days One child 1 year old, 9yo at home Not losing weight like she was prior to IUD placement  Wonders if she woul dbe better switching birth contorl, has been thinking about nexplanon, her biggest concern is that she thinks mirena is causing weight gain.  Working now at [employer] Weight problems for several years. 3-4 years ago before children she says her Weight was in 150s, would gain and lose, then gained weight with daughter and hasnt lost it Was losing weight while breast feeding Doesn't eat breakfast, feels sick when she eats breakfast Not much snacking, does eat crackers regularly  ROS: All systems negativ eother than what is in HPI  Past Medical History Patient Active Problem List   Diagnosis Date Noted  . BMI 40.0-44.9, adult ([facility]) 12/21/2014  . Postpartum exam 12/24/2013  . Contraception management 12/24/2013  . Gestational hypertension 11/01/2013  . Rh negative, antepartum 06/03/2013   Family History  Problem Relation Age of Onset  . Kidney disease Father   . COPD Father   . Hypertension Father   . Kidney disease Sister   . Asthma Sister   . Early death Mother   . Stroke Mother   . Asthma Brother    Social History   Social History  . Marital Status: Divorced    Spouse Name: N/A  . Number of  Children: N/A  . Years of Education: N/A   Occupational History  . Not on file.   Social History Main Topics  . Smoking status: Never Smoker   . Smokeless tobacco: Never Used  . Alcohol Use: No  . Drug Use: No  . Sexual Activity: Yes    Birth Control/ Protection: IUD   Other Topics Concern  . Not on file   Social History Narrative    Medications: none    Objective:    BP 135/94 mmHg  Pulse 106  Temp(Src) 99.4 F (37.4 C) (Oral)  Ht 5' 4" (1.626 m)  Wt 255 lb (115.667 kg)  BMI 43.75 kg/m2  LMP 11/26/2014  Breastfeeding? No  Wt Readings from Last 3 Encounters:  12/21/14 255 lb (115.667 kg)  02/07/14 236 lb 11.2 oz (107.366 kg)  12/24/13 230 lb 14.4 oz (104.736 kg)     Gen: NAD, alert, cooperative with exam, NCAT EYES: EOMI, no scleral injection or icterus ENT:  TMs pearly gray b/l, OP without erythema LYMPH: no cervical LAD Neck: nl thyroid CV: NRRR, normal S1/S2, no murmur Resp: CTABL, no wheezes, normal WOB Abd: +BS, soft, NTND. no guarding or organomegaly Ext: No edema, warm Neuro: Alert and oriented, strength equal b/l UE and LE, coordination grossly normal MSK: normal muscle bulk     Assessment & Plan:   Vermelle was seen today for CPE.  Diagnoses   and all orders for this visit:  Encounter for preventive health examination -     CMP14+EGFR -     CBC -     Lipid panel  BMI 40.0-44.9, adult ([place]) Discussed healthy lifestyle choices, see pt instructions. Will have her f/u with Tammy Eckard for weight loss counseling, then with me in 2 months. -     CMP14+EGFR -     CBC -     Lipid panel  Encounter for other contraceptive management Not clear if pain in pelvic area was from IUD. Discussed mirena not likely to be cause of her inability to lose weight. She does not want birth contorl that she has to take daily. If pain returns, will send to gyn for IUD placement eval.  Follow up plan: Return for Tammy for nutrition/weight loss when available, f/u with  me in 2 months.  Assunta Found, MD [facility] 12/21/2014, 2:14 PM

## 2014-12-22 LAB — LIPID PANEL
CHOLESTEROL TOTAL: 163 mg/dL (ref 100–199)
Chol/HDL Ratio: 4.2 ratio units (ref 0.0–4.4)
HDL: 39 mg/dL — ABNORMAL LOW (ref >39)
LDL CALC: 91 mg/dL (ref 0–99)
Triglycerides: 167 mg/dL — ABNORMAL HIGH (ref 0–149)
VLDL CHOLESTEROL CAL: 33 mg/dL (ref 5–40)

## 2014-12-22 LAB — CMP14+EGFR
ALT: 15 IU/L (ref 0–32)
AST: 18 IU/L (ref 0–40)
Albumin/Globulin Ratio: 1.4 (ref 1.1–2.5)
Albumin: 4.3 g/dL (ref 3.5–5.5)
Alkaline Phosphatase: 85 IU/L (ref 39–117)
BUN/Creatinine Ratio: 11 (ref 8–20)
BUN: 10 mg/dL (ref 6–20)
Bilirubin Total: 0.2 mg/dL (ref 0.0–1.2)
CALCIUM: 9.4 mg/dL (ref 8.7–10.2)
CO2: 26 mmol/L (ref 18–29)
CREATININE: 0.89 mg/dL (ref 0.57–1.00)
Chloride: 100 mmol/L (ref 97–106)
GFR calc Af Amer: 103 mL/min/{1.73_m2} (ref >59)
GFR, EST NON AFRICAN AMERICAN: 89 mL/min/{1.73_m2} (ref >59)
Globulin, Total: 3 g/dL (ref 1.5–4.5)
Glucose: 63 mg/dL — ABNORMAL LOW (ref 65–99)
POTASSIUM: 4.3 mmol/L (ref 3.5–5.2)
Sodium: 139 mmol/L (ref 136–144)
TOTAL PROTEIN: 7.3 g/dL (ref 6.0–8.5)

## 2014-12-22 LAB — CBC
HEMATOCRIT: 42.5 % (ref 34.0–46.6)
HEMOGLOBIN: 14.2 g/dL (ref 11.1–15.9)
MCH: 29.1 pg (ref 26.6–33.0)
MCHC: 33.4 g/dL (ref 31.5–35.7)
MCV: 87 fL (ref 79–97)
Platelets: 264 10*3/uL (ref 150–379)
RBC: 4.88 x10E6/uL (ref 3.77–5.28)
RDW: 13.5 % (ref 12.3–15.4)
WBC: 8.5 10*3/uL (ref 3.4–10.8)

## 2015-01-12 ENCOUNTER — Encounter: Payer: Self-pay | Admitting: Pharmacist

## 2015-01-12 ENCOUNTER — Ambulatory Visit (INDEPENDENT_AMBULATORY_CARE_PROVIDER_SITE_OTHER): Payer: Medicaid Other | Admitting: Pharmacist

## 2015-01-12 DIAGNOSIS — E781 Pure hyperglyceridemia: Secondary | ICD-10-CM | POA: Diagnosis not present
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
## 2015-02-20 ENCOUNTER — Ambulatory Visit: Payer: Medicaid Other | Admitting: Pediatrics

## 2015-02-27 ENCOUNTER — Encounter: Payer: Self-pay | Admitting: Nurse Practitioner

## 2015-02-27 ENCOUNTER — Ambulatory Visit (INDEPENDENT_AMBULATORY_CARE_PROVIDER_SITE_OTHER): Payer: Medicaid Other | Admitting: Nurse Practitioner

## 2015-02-27 VITALS — BP 127/89 | HR 81 | Temp 98.4°F | Ht 64.0 in | Wt 254.0 lb

## 2015-02-27 DIAGNOSIS — R51 Headache: Secondary | ICD-10-CM

## 2015-02-27 DIAGNOSIS — R519 Headache, unspecified: Secondary | ICD-10-CM

## 2015-02-27 NOTE — Patient Instructions (Signed)
Hypertension Hypertension, commonly called high blood pressure, is when the force of blood pumping through your arteries is too strong. Your arteries are the blood vessels that carry blood from your heart throughout your body. A blood pressure reading consists of a higher number over a lower number, such as 110/72. The higher number (systolic) is the pressure inside your arteries when your heart pumps. The lower number (diastolic) is the pressure inside your arteries when your heart relaxes. Ideally you want your blood pressure below 120/80. Hypertension forces your heart to work harder to pump blood. Your arteries may become narrow or stiff. Having untreated or uncontrolled hypertension can cause heart attack, stroke, kidney disease, and other problems. RISK FACTORS Some risk factors for high blood pressure are controllable. Others are not.  Risk factors you cannot control include:   Race. You may be at higher risk if you are African American.  Age. Risk increases with age.  Gender. Men are at higher risk than women before age 45 years. After age 65, women are at higher risk than men. Risk factors you can control include:  Not getting enough exercise or physical activity.  Being overweight.  Getting too much fat, sugar, calories, or salt in your diet.  Drinking too much alcohol. SIGNS AND SYMPTOMS Hypertension does not usually cause signs or symptoms. Extremely high blood pressure (hypertensive crisis) may cause headache, anxiety, shortness of breath, and nosebleed. DIAGNOSIS To check if you have hypertension, your health care provider will measure your blood pressure while you are seated, with your arm held at the level of your heart. It should be measured at least twice using the same arm. Certain conditions can cause a difference in blood pressure between your right and left arms. A blood pressure reading that is higher than normal on one occasion does not mean that you need treatment. If  it is not clear whether you have high blood pressure, you may be asked to return on a different day to have your blood pressure checked again. Or, you may be asked to monitor your blood pressure at home for 1 or more weeks. TREATMENT Treating high blood pressure includes making lifestyle changes and possibly taking medicine. Living a healthy lifestyle can help lower high blood pressure. You may need to change some of your habits. Lifestyle changes may include:  Following the DASH diet. This diet is high in fruits, vegetables, and whole grains. It is low in salt, red meat, and added sugars.  Keep your sodium intake below 2,300 mg per day.  Getting at least 30-45 minutes of aerobic exercise at least 4 times per week.  Losing weight if necessary.  Not smoking.  Limiting alcoholic beverages.  Learning ways to reduce stress. Your health care provider may prescribe medicine if lifestyle changes are not enough to get your blood pressure under control, and if one of the following is true:  You are 18-59 years of age and your systolic blood pressure is above 140.  You are 60 years of age or older, and your systolic blood pressure is above 150.  Your diastolic blood pressure is above 90.  You have diabetes, and your systolic blood pressure is over 140 or your diastolic blood pressure is over 90.  You have kidney disease and your blood pressure is above 140/90.  You have heart disease and your blood pressure is above 140/90. Your personal target blood pressure may vary depending on your medical conditions, your age, and other factors. HOME CARE INSTRUCTIONS    Have your blood pressure rechecked as directed by your health care provider.   Take medicines only as directed by your health care provider. Follow the directions carefully. Blood pressure medicines must be taken as prescribed. The medicine does not work as well when you skip doses. Skipping doses also puts you at risk for  problems.  Do not smoke.   Monitor your blood pressure at home as directed by your health care provider. SEEK MEDICAL CARE IF:   You think you are having a reaction to medicines taken.  You have recurrent headaches or feel dizzy.  You have swelling in your ankles.  You have trouble with your vision. SEEK IMMEDIATE MEDICAL CARE IF:  You develop a severe headache or confusion.  You have unusual weakness, numbness, or feel faint.  You have severe chest or abdominal pain.  You vomit repeatedly.  You have trouble breathing. MAKE SURE YOU:   Understand these instructions.  Will watch your condition.  Will get help right away if you are not doing well or get worse.   This information is not intended to replace advice given to you by your health care provider. Make sure you discuss any questions you have with your health care provider.   Document Released: 01/28/2005 Document Revised: 06/14/2014 Document Reviewed: 11/20/2012 Elsevier Interactive Patient Education 2016 Elsevier Inc.  

## 2015-02-27 NOTE — Progress Notes (Signed)
   Subjective:    Patient ID: Kelly Fry, female    DOB: [birth year], 30 y.o.   MRN: [id number]  HPI  Patient in for blood pressure recheck- [facility] saw Dr. Evette Doffing on 12/21/14 and blood pressure was borderline high.  Not on any meds- Does not check blood pressure at home. Says she is having episodes of feeling flushed with headache and she thinks that is when blood pressure is elevated.   Review of Systems  Constitutional: Negative.   HENT: Negative.   Respiratory: Negative.   Cardiovascular: Negative.   Genitourinary: Negative.   Neurological: Negative.   Psychiatric/Behavioral: Negative.   All other systems reviewed and are negative.      Objective:   Physical Exam  Constitutional: She is oriented to person, place, and time. She appears well-developed and well-nourished.  Cardiovascular: Normal rate and normal heart sounds.   Pulmonary/Chest: Effort normal and breath sounds normal.  Neurological: She is alert and oriented to person, place, and time.  Skin: Skin is warm.  Psychiatric: She has a normal mood and affect. Her behavior is normal. Judgment and thought content normal.    BP 127/89 mmHg  Pulse 81  Temp(Src) 98.4 F (36.9 C) (Oral)  Ht 5\' 4"  (1.626 m)  Wt 254 lb (115.214 kg)  BMI 43.58 kg/m2       Assessment & Plan:  Acute nonintractable headache, unspecified headache type  Keep diay of blood pressure at home Avoid adding salt to diet Exercise and weight loss encouraged RTO in 3-6 months with blood pressure diary- sooner if satying elevated  Mary-Margaret Hassell Done, FNP

## 2015-03-14 ENCOUNTER — Encounter: Payer: Self-pay | Admitting: Family Medicine

## 2015-03-14 ENCOUNTER — Ambulatory Visit (INDEPENDENT_AMBULATORY_CARE_PROVIDER_SITE_OTHER): Payer: Medicaid Other | Admitting: Family Medicine

## 2015-03-14 VITALS — BP 129/77 | HR 75 | Temp 97.1°F | Ht 64.0 in | Wt 254.2 lb

## 2015-03-14 DIAGNOSIS — F4323 Adjustment disorder with mixed anxiety and depressed mood: Secondary | ICD-10-CM

## 2015-03-14 MED ORDER — LURASIDONE HCL 20 MG PO TABS: 20.0000 mg | ORAL_TABLET | Freq: Every day | ORAL | Status: DC

## 2015-03-14 NOTE — Progress Notes (Signed)
   HPI  Patient presents today to discuss anxiety.  Patient For the last month or so she's had increased anxiety issues. 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 She states that it's a very stressful work environment and also she is very stressed out about missing time with her children.  Also her father has recently moved then, he has bipolar depression and bipolar disorder with recent suicidal attempt and he is causing lots of stress in the household.  She describes intermittent chest pain that comes on, with dull central chest pain, some associated dyspnea intermittently, lasting 1 hour and resolving when her anxiety resolves.  She's also had intermittent increase headaches. These also seem to be associated with only the anxious areas.  PMH: Smoking status noted ROS: Per HPI  Objective: BP 129/77 mmHg  Pulse 75  Temp(Src) 97.1 F (36.2 C) (Oral)  Ht 5\' 4"  (1.626 m)  Wt 254 lb 3.2 oz (115.304 kg)  BMI 43.61 kg/m2 Gen: NAD, alert, cooperative with exam HEENT: NCAT CV: RRR, good S1/S2, no murmur Resp: CTABL, no wheezes, non-labored Abd: SNTND, BS present, no guarding or organomegaly Ext: No edema, warm Neuro: Alert and oriented, No gross deficits  Psych: anxious affect, rocking, denies SI  Mood screening tools:  PHQ-9 score: 15, No SI GAD-7 Score: 19 MDQ was screen positive for bipolar disorder , 7 yes's, yes on 2, and moderate    Assessment and plan:  # Mood d/o Headaches tension type with bandlike distribution, assoc with anxious mood Poor sleep lately Considering MDQ and positives in both anxiety and depression I have start ed latuda Repeat labs in 3 months, reviewd from Nov 2-3 week f/u Refer to psych, recommend calling [facility] in [place]   Orders Placed This Encounter  Procedures  . Ambulatory referral to Psychiatry    Referral Priority:  Routine    Referral Type:  Psychiatric    Referral Reason:   Specialty Services Required    Requested Specialty:  Psychiatry    Number of Visits Requested:  1    Meds ordered this encounter  Medications  . lurasidone (LATUDA) 20 MG TABS tablet    Sig: Take 1 tablet (20 mg total) by mouth at bedtime.    Dispense:  30 tablet    Refill:  [place], MD [facility] 03/14/2015, 11:09 AM

## 2015-03-14 NOTE — Patient Instructions (Signed)
Great to meet you!  Come back in 2-3 weeks to see how you are doing.   Call the psychiatrist to try and arrange an appointment  Taking the medicine as directed and not missing any doses is one of the best things you can do to treat your mood.  Here are some things to keep in mind:  1) Side effects (stomach upset, some increased anxiety) may happen before you notice a benefit.  These side effects typically go away over time. 2) Changes to your dose of medicine or a change in medication all together is sometimes necessary 3) Most people need to be on medication at least 6-12 months 4) Many people will notice an improvement within two weeks but the full effect of the medication can take up to 4-6 weeks 5) Stopping the medication when you start feeling better often results in a return of symptoms 6) If you start having thoughts of hurting yourself or others after starting this medicine, please call 911 immediately

## 2015-03-28 ENCOUNTER — Ambulatory Visit (INDEPENDENT_AMBULATORY_CARE_PROVIDER_SITE_OTHER): Payer: Medicaid Other | Admitting: Family Medicine

## 2015-03-28 ENCOUNTER — Encounter: Payer: Self-pay | Admitting: Family Medicine

## 2015-03-28 VITALS — BP 128/78 | HR 74 | Temp 97.5°F | Ht 64.0 in | Wt 255.8 lb

## 2015-03-28 DIAGNOSIS — F39 Unspecified mood [affective] disorder: Secondary | ICD-10-CM | POA: Diagnosis not present

## 2015-03-28 DIAGNOSIS — F32A Depression, unspecified: Secondary | ICD-10-CM | POA: Insufficient documentation

## 2015-03-28 MED ORDER — ARIPIPRAZOLE 15 MG PO TABS: 15.0000 mg | ORAL_TABLET | Freq: Every day | ORAL | Status: DC

## 2015-03-28 NOTE — Progress Notes (Signed)
   HPI  Patient presents today for follow-up for mood disorder.  Patient last visit was screen positive for depression, anxiety, and bipolar disorder. I discussed with her that the best treatment with the psychiatry, in the meantime we have tried [place]. She states that she had shaking with Latuda, she tried it for about 1-2 weeks and stopped it due to intolerance. She did not notice any improvement in her mood.  She denies suicidal ideation today.  She has increased anxiety.  She has heard from a psychiatrist but needs to call for appointment.  PMH: Smoking status noted ROS: Per HPI  Objective: BP 128/78 mmHg  Pulse 74  Temp(Src) 97.5 F (36.4 C) (Oral)  Ht 5\' 4"  (1.626 m)  Wt 255 lb 12.8 oz (116.03 kg)  BMI 43.89 kg/m2 Gen: NAD, alert, cooperative with exam HEENT: NCAT CV: RRR, good S1/S2, no murmur Resp: CTABL, no wheezes, non-labored Ext: No edema, warm Neuro: Alert and oriented, No gross deficits  Assessment and plan:  # New disorder Discontinue Latuda, did not tolerate Trial of Abilify I recommended strongly that she follow-up with psychiatry as soon as possible, she does have a referral working, they have called to set up appointment that she has not returned the call yet. Follow-up with me in 3 -4 weeks.   Meds ordered this encounter  Medications  . ibuprofen (ADVIL,MOTRIN) 800 MG tablet    Sig: Take 800 mg by mouth every 8 (eight) hours as needed.  [name] DISCONTD: acetaminophen-codeine (TYLENOL #4) 300-60 MG tablet    Sig: Take 1 tablet by mouth every 4 (four) hours as needed for pain.  [name] penicillAMINE (CUPRIMINE) 250 MG capsule    Sig: Take by mouth 3 (three) times daily before meals.  . ARIPiprazole (ABILIFY) 15 MG tablet    Sig: Take 1 tablet (15 mg total) by mouth daily.    Dispense:  30 tablet    Refill:  [place], MD [facility] 03/28/2015, 8:31 AM

## 2015-03-28 NOTE — Patient Instructions (Signed)
Great to see you!  Start abilify 1/2 tab each night for 1 week then 1 tab each night after that  Call for a psychiatry appointment  Come back in 3-4 weeks.   Please call for help immediately to friends, family, or 911 if you develop suicidal thoughts

## 2015-04-05 ENCOUNTER — Ambulatory Visit: Payer: Self-pay | Admitting: Pharmacist

## 2015-04-06 ENCOUNTER — Ambulatory Visit (INDEPENDENT_AMBULATORY_CARE_PROVIDER_SITE_OTHER): Payer: Medicaid Other | Admitting: Pharmacist

## 2015-04-06 ENCOUNTER — Encounter: Payer: Self-pay | Admitting: Pharmacist

## 2015-04-06 VITALS — BP 128/80 | HR 78 | Ht 64.0 in | Wt 254.5 lb

## 2015-04-06 DIAGNOSIS — E669 Obesity, unspecified: Secondary | ICD-10-CM
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
## 2015-04-10 ENCOUNTER — Telehealth: Payer: Self-pay | Admitting: Family Medicine

## 2015-04-10 NOTE — Telephone Encounter (Signed)
Ok to change to AM dosing.  If it causes any sleepiness she will have to take it at night.   Laroy Apple, MD [facility] 04/10/2015, 5:12 PM

## 2015-04-11 NOTE — Telephone Encounter (Signed)
Pt is aware.  

## 2015-04-12 HISTORY — PX: WISDOM TOOTH EXTRACTION: SHX21

## 2015-04-18 ENCOUNTER — Encounter (INDEPENDENT_AMBULATORY_CARE_PROVIDER_SITE_OTHER): Payer: Self-pay

## 2015-04-18 ENCOUNTER — Ambulatory Visit (INDEPENDENT_AMBULATORY_CARE_PROVIDER_SITE_OTHER): Payer: Medicaid Other | Admitting: Family Medicine

## 2015-04-18 ENCOUNTER — Encounter: Payer: Self-pay | Admitting: Family Medicine

## 2015-04-18 VITALS — BP 124/86 | HR 76 | Temp 97.4°F | Ht 64.0 in | Wt 247.2 lb

## 2015-04-18 DIAGNOSIS — F39 Unspecified mood [affective] disorder: Secondary | ICD-10-CM | POA: Diagnosis not present

## 2015-04-18 DIAGNOSIS — R51 Headache: Secondary | ICD-10-CM | POA: Diagnosis not present

## 2015-04-18 DIAGNOSIS — R519 Headache, unspecified: Secondary | ICD-10-CM | POA: Insufficient documentation

## 2015-04-18 DIAGNOSIS — Z6841 Body Mass Index (BMI) 40.0 and over, adult: Secondary | ICD-10-CM

## 2015-04-18 MED ORDER — ARIPIPRAZOLE 10 MG PO TABS: 10.0000 mg | ORAL_TABLET | Freq: Every day | ORAL | Status: DC

## 2015-04-18 NOTE — Progress Notes (Signed)
   HPI  Patient presents today here for follow-up mood disorder.  On her last exam was concerned about undiagnosed bipolar disorder. He was initially started on Latuda did not tolerate this. She denies suicidal ideation. She states that her mood has been stabilized quite well, she states that she hasn't had as many ups or downs, no episodes of depression since starting Abilify. She's tolerating it well.  At this time by exercise, she had a recent wisdom tooth extraction has not been able to exercise like usual since that time, however she is still taking attention to portion control limiting fried and fatty foods  PMH: Smoking status noted ROS: Per HPI  Objective: BP 124/86 mmHg  Pulse 76  Temp(Src) 97.4 F (36.3 C) (Oral)  Ht '5\' 4"'$  (1.626 m)  Wt 247 lb 3.2 oz (112.129 kg)  BMI 42.41 kg/m2  LMP 04/14/2015 Gen: NAD, alert, cooperative with exam HEENT: NCAT CV: RRR, good S1/S2, no murmur Resp: CTABL, no wheezes, non-labored Ext: No edema, warm Neuro: Alert and oriented, No gross deficits Psych: Normal mood and affect, denies suicidal ideation  Assessment and plan:  # Mood Disorder, likely bipolar disorder (MDQ positive) She is tolerating Abilify well She would like to decrease the dose slightly, I have decreased his to 10 milligrams. She has a psychiatry appointment next month. Rechecking labs today, considering possible deleterious effects of antipsychotics on lipids, plan to recheck once a year  Headaches Improved since her mood has improved, continue Abilify.  Obesity, BMI over 40 Discussed diet and exercise, she is following up with our clinical pharmacist for more intensive treatment   Orders Placed This Encounter  Procedures  . CBC  . CMP14+EGFR  . Lipid panel  . TSH     Laroy Apple, MD [facility] 04/18/2015, 9:33 AM

## 2015-04-18 NOTE — Patient Instructions (Signed)
Great to see you!  Come back in 6 months  I have decreased the dose of abilifty to 10 mg

## 2015-04-19 LAB — CMP14+EGFR
A/G RATIO: 1.5 (ref 1.1–2.5)
ALBUMIN: 4.3 g/dL (ref 3.5–5.5)
ALK PHOS: 89 IU/L (ref 39–117)
ALT: 16 IU/L (ref 0–32)
AST: 18 IU/L (ref 0–40)
BILIRUBIN TOTAL: 0.4 mg/dL (ref 0.0–1.2)
BUN / CREAT RATIO: 13 (ref 8–20)
BUN: 11 mg/dL (ref 6–20)
CHLORIDE: 102 mmol/L (ref 96–106)
CO2: 20 mmol/L (ref 18–29)
Calcium: 9.2 mg/dL (ref 8.7–10.2)
Creatinine, Ser: 0.87 mg/dL (ref 0.57–1.00)
GFR calc Af Amer: 106 mL/min/{1.73_m2} (ref >59)
GFR calc non Af Amer: 92 mL/min/{1.73_m2} (ref >59)
GLUCOSE: 79 mg/dL (ref 65–99)
Globulin, Total: 2.9 g/dL (ref 1.5–4.5)
POTASSIUM: 4.1 mmol/L (ref 3.5–5.2)
Sodium: 140 mmol/L (ref 134–144)
Total Protein: 7.2 g/dL (ref 6.0–8.5)

## 2015-04-19 LAB — LIPID PANEL
CHOLESTEROL TOTAL: 159 mg/dL (ref 100–199)
Chol/HDL Ratio: 4.8 ratio units — ABNORMAL HIGH (ref 0.0–4.4)
HDL: 33 mg/dL — ABNORMAL LOW (ref >39)
LDL Calculated: 103 mg/dL — ABNORMAL HIGH (ref 0–99)
Triglycerides: 113 mg/dL (ref 0–149)
VLDL CHOLESTEROL CAL: 23 mg/dL (ref 5–40)

## 2015-04-19 LAB — CBC
Hematocrit: 42.6 % (ref 34.0–46.6)
Hemoglobin: 14.4 g/dL (ref 11.1–15.9)
MCH: 29.8 pg (ref 26.6–33.0)
MCHC: 33.8 g/dL (ref 31.5–35.7)
MCV: 88 fL (ref 79–97)
PLATELETS: 272 10*3/uL (ref 150–379)
RBC: 4.83 x10E6/uL (ref 3.77–5.28)
RDW: 12.9 % (ref 12.3–15.4)
WBC: 5.7 10*3/uL (ref 3.4–10.8)

## 2015-04-19 LAB — TSH: TSH: 1.14 u[IU]/mL (ref 0.450–4.500)

## 2015-05-16 ENCOUNTER — Telehealth (HOSPITAL_COMMUNITY): Payer: Self-pay | Admitting: *Deleted

## 2015-05-16 NOTE — Telephone Encounter (Signed)
lmtcb due to needing to resch appt due to provider out of office April 20th. Number was provided.

## 2015-06-01 ENCOUNTER — Ambulatory Visit (HOSPITAL_COMMUNITY): Payer: Self-pay | Admitting: Psychiatry

## 2015-06-01 ENCOUNTER — Ambulatory Visit (INDEPENDENT_AMBULATORY_CARE_PROVIDER_SITE_OTHER): Payer: Medicaid Other | Admitting: Pharmacist

## 2015-06-01 ENCOUNTER — Encounter: Payer: Self-pay | Admitting: Pharmacist

## 2015-06-01 VITALS — Ht 64.0 in | Wt 252.0 lb

## 2015-06-01 DIAGNOSIS — E669 Obesity, unspecified: Secondary | ICD-10-CM
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
## 2015-06-27 ENCOUNTER — Ambulatory Visit (HOSPITAL_COMMUNITY): Payer: Self-pay | Admitting: Psychiatry

## 2015-06-30 ENCOUNTER — Ambulatory Visit (INDEPENDENT_AMBULATORY_CARE_PROVIDER_SITE_OTHER): Payer: Medicaid Other | Admitting: Family

## 2015-06-30 ENCOUNTER — Encounter: Payer: Self-pay | Admitting: Family

## 2015-06-30 VITALS — BP 119/85 | HR 93 | Temp 98.9°F | Ht 64.0 in | Wt 255.2 lb

## 2015-06-30 DIAGNOSIS — Z30432 Encounter for removal of intrauterine contraceptive device: Secondary | ICD-10-CM

## 2015-06-30 DIAGNOSIS — Z30011 Encounter for initial prescription of contraceptive pills: Secondary | ICD-10-CM

## 2015-06-30 DIAGNOSIS — Z6841 Body Mass Index (BMI) 40.0 and over, adult: Secondary | ICD-10-CM | POA: Diagnosis not present

## 2015-06-30 DIAGNOSIS — F39 Unspecified mood [affective] disorder: Secondary | ICD-10-CM

## 2015-06-30 DIAGNOSIS — Z0000 Encounter for general adult medical examination without abnormal findings: Secondary | ICD-10-CM | POA: Diagnosis not present

## 2015-06-30 DIAGNOSIS — F411 Generalized anxiety disorder: Secondary | ICD-10-CM

## 2015-06-30 DIAGNOSIS — Z01419 Encounter for gynecological examination (general) (routine) without abnormal findings: Secondary | ICD-10-CM

## 2015-06-30 MED ORDER — NORGESTIM-ETH ESTRAD TRIPHASIC 0.18/0.215/0.25 MG-25 MCG PO TABS: 1.0000 | ORAL_TABLET | Freq: Every day | ORAL | Status: DC

## 2015-06-30 NOTE — Progress Notes (Signed)
Subjective:    Patient ID: Kelly Fry, female    DOB: [birth year], 30 y.o.   MRN: [id number]  PT presents to the office today for CPE with pap. PT currently taking medication for anxiety. PT requesting that her mirena be taken out. Pt states she had it placed in 11/15 and states she has intermittent pain when she "sits down fast or moves the wrong was". PT states she would rather be on OC at this time.  Gynecologic Exam  Anxiety Presents for follow-up visit. The problem has been waxing and waning. Symptoms include nervous/anxious behavior. Patient reports no depressed mood, excessive worry, irritability, malaise, palpitations, panic or restlessness. Symptoms occur occasionally. The symptoms are aggravated by family issues. The quality of sleep is good.   Her past medical history is significant for anxiety/panic attacks.     Review of Systems  Constitutional: Negative for irritability.  Cardiovascular: Negative for palpitations.  Psychiatric/Behavioral: The patient is nervous/anxious.   All other systems reviewed and are negative.      Objective:   Physical Exam  Constitutional: She is oriented to person, place, and time. She appears well-developed and well-nourished. No distress.  HENT:  Head: Normocephalic and atraumatic.  Right Ear: External ear normal.  Left Ear: External ear normal.  Nose: Nose normal.  Mouth/Throat: Oropharynx is clear and moist.  Eyes: Pupils are equal, round, and reactive to light.  Neck: Normal range of motion. Neck supple. No thyromegaly present.  Cardiovascular: Normal rate, regular rhythm, normal heart sounds and intact distal pulses.   No murmur heard. Pulmonary/Chest: Effort normal and breath sounds normal. No respiratory distress. She has no wheezes. Right breast exhibits no inverted nipple, no mass, no nipple discharge, no skin change and no tenderness. Left breast exhibits no inverted nipple, no mass, no nipple discharge, no skin change and no  tenderness. Breasts are symmetrical.  Abdominal: Soft. Bowel sounds are normal. She exhibits no distension. There is no tenderness.  Genitourinary: Vagina normal.  Bimanual exam- no adnexal masses or tenderness, ovaries nonpalpable   Cervix parous and pink- No discharge IUD removed   Musculoskeletal: Normal range of motion. She exhibits no edema or tenderness.  Neurological: She is alert and oriented to person, place, and time. She has normal reflexes. No cranial nerve deficit.  Skin: Skin is warm and dry.  Psychiatric: She has a normal mood and affect. Her behavior is normal. Judgment and thought content normal.  Vitals reviewed.   BP 119/85 mmHg  Pulse 93  Temp(Src) 98.9 F (37.2 C) (Oral)  Ht _0  (1.626 m)  Wt 255 lb 3.2 oz (115.758 kg)  BMI 43.78 kg/m2  LMP 05/31/2015 (Exact Date)       Assessment & Plan:  1. BMI 40.0-44.9, adult ([place]) - CMP14+EGFR  2. Mood disorder (HCC) - CMP14+EGFR  3. GAD (generalized anxiety disorder) - CMP14+EGFR  4. Annual physical exam - Anemia Profile B - CMP14+EGFR - Lipid panel - Thyroid Panel With TSH - VITAMIN D 25 Hydroxy (Vit-D Deficiency, Fractures) - Pap IG, CT/NG w/ reflex HPV when ASC-U  5. Encounter for routine gynecological examination - CMP14+EGFR - Pap IG, CT/NG w/ reflex HPV when ASC-U  6. Encounter for IUD removal - CMP14+EGFR  7. Encounter for initial prescription of contraceptive pills - CMP14+EGFR - Norgestimate-Ethinyl Estradiol Triphasic (ORTHO TRI-CYCLEN LO) 0.18/0.215/0.25 MG-25 MCG tab; Take 1 tablet by mouth daily.  Dispense: 3 Package; Refill: 4   Continue all meds Labs pending Health Maintenance reviewed Diet and  exercise encouraged RTO 1 year  Evelina Dun, FNP

## 2015-06-30 NOTE — Patient Instructions (Signed)
Health Maintenance, Female Adopting a healthy lifestyle and getting preventive care can go a long way to promote health and wellness. Talk with your health care provider about what schedule of regular examinations is right for you. This is a good chance for you to check in with your provider about disease prevention and staying healthy. In between checkups, there are plenty of things you can do on your own. Experts have done a lot of research about which lifestyle changes and preventive measures are most likely to keep you healthy. Ask your health care provider for more information. WEIGHT AND DIET  Eat a healthy diet  Be sure to include plenty of vegetables, fruits, low-fat dairy products, and lean protein.  Do not eat a lot of foods high in solid fats, added sugars, or salt.  Get regular exercise. This is one of the most important things you can do for your health.  Most adults should exercise for at least 150 minutes each week. The exercise should increase your heart rate and make you sweat (moderate-intensity exercise).  Most adults should also do strengthening exercises at least twice a week. This is in addition to the moderate-intensity exercise.  Maintain a healthy weight  Body mass index (BMI) is a measurement that can be used to identify possible weight problems. It estimates body fat based on height and weight. Your health care provider can help determine your BMI and help you achieve or maintain a healthy weight.  For females 20 years of age and older:   A BMI below 18.5 is considered underweight.  A BMI of 18.5 to 24.9 is normal.  A BMI of 25 to 29.9 is considered overweight.  A BMI of 30 and above is considered obese.  Watch levels of cholesterol and blood lipids  You should start having your blood tested for lipids and cholesterol at 28 years of age, then have this test every 5 years.  You may need to have your cholesterol levels checked more often if:  Your lipid  or cholesterol levels are high.  You are older than 28 years of age.  You are at high risk for heart disease.  CANCER SCREENING   Lung Cancer  Lung cancer screening is recommended for adults 55-80 years old who are at high risk for lung cancer because of a history of smoking.  A yearly low-dose CT scan of the lungs is recommended for people who:  Currently smoke.  Have quit within the past 15 years.  Have at least a 30-pack-year history of smoking. A pack year is smoking an average of one pack of cigarettes a day for 1 year.  Yearly screening should continue until it has been 15 years since you quit.  Yearly screening should stop if you develop a health problem that would prevent you from having lung cancer treatment.  Breast Cancer  Practice breast self-awareness. This means understanding how your breasts normally appear and feel.  It also means doing regular breast self-exams. Let your health care provider know about any changes, no matter how small.  If you are in your 20s or 30s, you should have a clinical breast exam (CBE) by a health care provider every 1-3 years as part of a regular health exam.  If you are 40 or older, have a CBE every year. Also consider having a breast X-ray (mammogram) every year.  If you have a family history of breast cancer, talk to your health care provider about genetic screening.  If you   are at high risk for breast cancer, talk to your health care provider about having an MRI and a mammogram every year.  Breast cancer gene (BRCA) assessment is recommended for women who have family members with BRCA-related cancers. BRCA-related cancers include:  Breast.  Ovarian.  Tubal.  Peritoneal cancers.  Results of the assessment will determine the need for genetic counseling and BRCA1 and BRCA2 testing. Cervical Cancer Your health care provider may recommend that you be screened regularly for cancer of the pelvic organs (ovaries, uterus, and  vagina). This screening involves a pelvic examination, including checking for microscopic changes to the surface of your cervix (Pap test). You may be encouraged to have this screening done every 3 years, beginning at age 21.  For women ages 30-65, health care providers may recommend pelvic exams and Pap testing every 3 years, or they may recommend the Pap and pelvic exam, combined with testing for human papilloma virus (HPV), every 5 years. Some types of HPV increase your risk of cervical cancer. Testing for HPV may also be done on women of any age with unclear Pap test results.  Other health care providers may not recommend any screening for nonpregnant women who are considered low risk for pelvic cancer and who do not have symptoms. Ask your health care provider if a screening pelvic exam is right for you.  If you have had past treatment for cervical cancer or a condition that could lead to cancer, you need Pap tests and screening for cancer for at least 20 years after your treatment. If Pap tests have been discontinued, your risk factors (such as having a new sexual partner) need to be reassessed to determine if screening should resume. Some women have medical problems that increase the chance of getting cervical cancer. In these cases, your health care provider may recommend more frequent screening and Pap tests. Colorectal Cancer  This type of cancer can be detected and often prevented.  Routine colorectal cancer screening usually begins at 28 years of age and continues through 28 years of age.  Your health care provider may recommend screening at an earlier age if you have risk factors for colon cancer.  Your health care provider may also recommend using home test kits to check for hidden blood in the stool.  A small camera at the end of a tube can be used to examine your colon directly (sigmoidoscopy or colonoscopy). This is done to check for the earliest forms of colorectal  cancer.  Routine screening usually begins at age 50.  Direct examination of the colon should be repeated every 5-10 years through 28 years of age. However, you may need to be screened more often if early forms of precancerous polyps or small growths are found. Skin Cancer  Check your skin from head to toe regularly.  Tell your health care provider about any new moles or changes in moles, especially if there is a change in a mole's shape or color.  Also tell your health care provider if you have a mole that is larger than the size of a pencil eraser.  Always use sunscreen. Apply sunscreen liberally and repeatedly throughout the day.  Protect yourself by wearing long sleeves, pants, a wide-brimmed hat, and sunglasses whenever you are outside. HEART DISEASE, DIABETES, AND HIGH BLOOD PRESSURE   High blood pressure causes heart disease and increases the risk of stroke. High blood pressure is more likely to develop in:  People who have blood pressure in the high end   of the normal range (130-139/85-89 mm Hg).  People who are overweight or obese.  People who are African American.  If you are 60-73 years of age, have your blood pressure checked every 3-5 years. If you are 1 years of age or older, have your blood pressure checked every year. You should have your blood pressure measured twice--once when you are at a hospital or clinic, and once when you are not at a hospital or clinic. Record the average of the two measurements. To check your blood pressure when you are not at a hospital or clinic, you can use:  An automated blood pressure machine at a pharmacy.  A home blood pressure monitor.  If you are between 50 years and 70 years old, ask your health care provider if you should take aspirin to prevent strokes.  Have regular diabetes screenings. This involves taking a blood sample to check your fasting blood sugar level.  If you are at a normal weight and have a low risk for diabetes,  have this test once every three years after 28 years of age.  If you are overweight and have a high risk for diabetes, consider being tested at a younger age or more often. PREVENTING INFECTION  Hepatitis B  If you have a higher risk for hepatitis B, you should be screened for this virus. You are considered at high risk for hepatitis B if:  You were born in a country where hepatitis B is common. Ask your health care provider which countries are considered high risk.  Your parents were born in a high-risk country, and you have not been immunized against hepatitis B (hepatitis B vaccine).  You have HIV or AIDS.  You use needles to inject street drugs.  You live with someone who has hepatitis B.  You have had sex with someone who has hepatitis B.  You get hemodialysis treatment.  You take certain medicines for conditions, including cancer, organ transplantation, and autoimmune conditions. Hepatitis C  Blood testing is recommended for:  Everyone born from [birth year] through 1963.  Anyone with known risk factors for hepatitis C. Sexually transmitted infections (STIs)  You should be screened for sexually transmitted infections (STIs) including gonorrhea and chlamydia if:  You are sexually active and are younger than 28 years of age.  You are older than 28 years of age and your health care provider tells you that you are at risk for this type of infection.  Your sexual activity has changed since you were last screened and you are at an increased risk for chlamydia or gonorrhea. Ask your health care provider if you are at risk.  If you do not have HIV, but are at risk, it may be recommended that you take a prescription medicine daily to prevent HIV infection. This is called pre-exposure prophylaxis (PrEP). You are considered at risk if:  You are sexually active and do not regularly use condoms or know the HIV status of your partner(s).  You take drugs by injection.  You are sexually  active with a partner who has HIV. Talk with your health care provider about whether you are at high risk of being infected with HIV. If you choose to begin PrEP, you should first be tested for HIV. You should then be tested every 3 months for as long as you are taking PrEP.  PREGNANCY   If you are premenopausal and you may become pregnant, ask your health care provider about preconception counseling.  If you may  become pregnant, take 400 to 800 micrograms (mcg) of folic acid every day.  If you want to prevent pregnancy, talk to your health care provider about birth control (contraception). OSTEOPOROSIS AND MENOPAUSE   Osteoporosis is a disease in which the bones lose minerals and strength with aging. This can result in serious bone fractures. Your risk for osteoporosis can be identified using a bone density scan.  If you are 61 years of age or older, or if you are at risk for osteoporosis and fractures, ask your health care provider if you should be screened.  Ask your health care provider whether you should take a calcium or vitamin D supplement to lower your risk for osteoporosis.  Menopause may have certain physical symptoms and risks.  Hormone replacement therapy may reduce some of these symptoms and risks. Talk to your health care provider about whether hormone replacement therapy is right for you.  HOME CARE INSTRUCTIONS   Schedule regular health, dental, and eye exams.  Stay current with your immunizations.   Do not use any tobacco products including cigarettes, chewing tobacco, or electronic cigarettes.  If you are pregnant, do not drink alcohol.  If you are breastfeeding, limit how much and how often you drink alcohol.  Limit alcohol intake to no more than 1 drink per day for nonpregnant women. One drink equals 12 ounces of beer, 5 ounces of wine, or 1 ounces of hard liquor.  Do not use street drugs.  Do not share needles.  Ask your health care provider for help if  you need support or information about quitting drugs.  Tell your health care provider if you often feel depressed.  Tell your health care provider if you have ever been abused or do not feel safe at home.   This information is not intended to replace advice given to you by your health care provider. Make sure you discuss any questions you have with your health care provider.   Document Released: 08/13/2010 Document Revised: 02/18/2014 Document Reviewed: 12/30/2012 Elsevier Interactive Patient Education [employer].

## 2015-07-01 LAB — THYROID PANEL WITH TSH
FREE THYROXINE INDEX: 2.3 (ref 1.2–4.9)
T3 UPTAKE RATIO: 32 % (ref 24–39)
T4 TOTAL: 7.2 ug/dL (ref 4.5–12.0)
TSH: 2.74 u[IU]/mL (ref 0.450–4.500)

## 2015-07-01 LAB — LIPID PANEL
CHOLESTEROL TOTAL: 179 mg/dL (ref 100–199)
Chol/HDL Ratio: 3.5 ratio units (ref 0.0–4.4)
HDL: 51 mg/dL (ref >39)
LDL Calculated: 109 mg/dL — ABNORMAL HIGH (ref 0–99)
TRIGLYCERIDES: 97 mg/dL (ref 0–149)
VLDL Cholesterol Cal: 19 mg/dL (ref 5–40)

## 2015-07-01 LAB — ANEMIA PROFILE B
BASOS ABS: 0 10*3/uL (ref 0.0–0.2)
Basos: 0 %
EOS (ABSOLUTE): 0.2 10*3/uL (ref 0.0–0.4)
EOS: 2 %
FOLATE: 12.6 ng/mL (ref >3.0)
Ferritin: 56 ng/mL (ref 15–150)
HEMOGLOBIN: 14.8 g/dL (ref 11.1–15.9)
Hematocrit: 43 % (ref 34.0–46.6)
IMMATURE GRANS (ABS): 0 10*3/uL (ref 0.0–0.1)
IMMATURE GRANULOCYTES: 0 %
IRON SATURATION: 28 % (ref 15–55)
IRON: 101 ug/dL (ref 27–159)
LYMPHS ABS: 1.5 10*3/uL (ref 0.7–3.1)
LYMPHS: 19 %
MCH: 29.8 pg (ref 26.6–33.0)
MCHC: 34.4 g/dL (ref 31.5–35.7)
MCV: 87 fL (ref 79–97)
MONOCYTES: 7 %
Monocytes Absolute: 0.5 10*3/uL (ref 0.1–0.9)
NEUTROS PCT: 72 %
Neutrophils Absolute: 5.7 10*3/uL (ref 1.4–7.0)
PLATELETS: 249 10*3/uL (ref 150–379)
RBC: 4.96 x10E6/uL (ref 3.77–5.28)
RDW: 13.5 % (ref 12.3–15.4)
RETIC CT PCT: 1.9 % (ref 0.6–2.6)
TIBC: 360 ug/dL (ref 250–450)
UIBC: 259 ug/dL (ref 131–425)
Vitamin B-12: 350 pg/mL (ref 211–946)
WBC: 8 10*3/uL (ref 3.4–10.8)

## 2015-07-01 LAB — CMP14+EGFR
ALT: 15 IU/L (ref 0–32)
AST: 13 IU/L (ref 0–40)
Albumin/Globulin Ratio: 1.6 (ref 1.2–2.2)
Albumin: 4.5 g/dL (ref 3.5–5.5)
Alkaline Phosphatase: 95 IU/L (ref 39–117)
BUN/Creatinine Ratio: 15 (ref 9–23)
BUN: 15 mg/dL (ref 6–20)
Bilirubin Total: 0.3 mg/dL (ref 0.0–1.2)
CALCIUM: 9.8 mg/dL (ref 8.7–10.2)
CO2: 21 mmol/L (ref 18–29)
CREATININE: 0.99 mg/dL (ref 0.57–1.00)
Chloride: 102 mmol/L (ref 96–106)
GFR calc Af Amer: 90 mL/min/{1.73_m2} (ref >59)
GFR, EST NON AFRICAN AMERICAN: 78 mL/min/{1.73_m2} (ref >59)
GLOBULIN, TOTAL: 2.9 g/dL (ref 1.5–4.5)
Glucose: 86 mg/dL (ref 65–99)
Potassium: 4.5 mmol/L (ref 3.5–5.2)
SODIUM: 141 mmol/L (ref 134–144)
Total Protein: 7.4 g/dL (ref 6.0–8.5)

## 2015-07-01 LAB — VITAMIN D 25 HYDROXY (VIT D DEFICIENCY, FRACTURES): Vit D, 25-Hydroxy: 26.7 ng/mL — ABNORMAL LOW (ref 30.0–100.0)

## 2015-07-04 ENCOUNTER — Other Ambulatory Visit: Payer: Self-pay | Admitting: Family

## 2015-07-04 DIAGNOSIS — E559 Vitamin D deficiency, unspecified: Secondary | ICD-10-CM | POA: Insufficient documentation

## 2015-07-04 LAB — PAP IG, CT-NG, RFX HPV ASCU
Chlamydia, Nuc. Acid Amp: NEGATIVE
Gonococcus by Nucleic Acid Amp: NEGATIVE
PAP SMEAR COMMENT: 0

## 2015-07-04 MED ORDER — VITAMIN D (ERGOCALCIFEROL) 1.25 MG (50000 UNIT) PO CAPS: 50000.0000 [IU] | ORAL_CAPSULE | ORAL | Status: DC

## 2015-07-05 ENCOUNTER — Ambulatory Visit (INDEPENDENT_AMBULATORY_CARE_PROVIDER_SITE_OTHER): Payer: Medicaid Other

## 2015-07-05 ENCOUNTER — Encounter: Payer: Self-pay | Admitting: Family Medicine

## 2015-07-05 ENCOUNTER — Ambulatory Visit (INDEPENDENT_AMBULATORY_CARE_PROVIDER_SITE_OTHER): Payer: Medicaid Other | Admitting: Family Medicine

## 2015-07-05 VITALS — BP 113/69 | HR 69 | Temp 97.7°F | Ht 64.0 in | Wt 258.8 lb

## 2015-07-05 DIAGNOSIS — M25571 Pain in right ankle and joints of right foot: Secondary | ICD-10-CM

## 2015-07-05 NOTE — Progress Notes (Signed)
   HPI  Patient presents today here with right ankle pain.  Patient's lines over 2 months ago she was walking down the years with her 30-year-old in her arms when her cat was between her legs causing her to shrimp and catch herself rolling her right ankle inward. She's had lateral ankle pain since that time, initially she had bruises and pain which was expected, however the bruise improved and resolved and the pain continued.  She has worse pain with walking and driving. She has morning stiffness. She has no joint laxity or joint instability.  PMH: Smoking status noted ROS: Per HPI  Objective: BP 113/69 mmHg  Pulse 69  Temp(Src) 97.7 F (36.5 C) (Oral)  Ht 5\' 4"  (1.626 m)  Wt 258 lb 12.8 oz (117.391 kg)  BMI 44.40 kg/m2  LMP 05/31/2015 (Exact Date) Gen: NAD, alert, cooperative with exam HEENT: NCAT Ext: No edema, warm Neuro: Alert and oriented, No gross deficits  Musculoskeletal Right ankle with tenderness to palpation of the anterior and posterior lateral ankle ligaments No gross deformity, redness, or swelling No joint laxity  Plain film of the ankle today without any acute findings  Assessment and plan:  # Ankle sprain Discussed supportive care, placed in Ace bandage today Reassurance provided Return to clinic if not improving as expected or any worsening     Orders Placed This Encounter  Procedures  . DG Ankle Complete Right    Standing Status: Future     Number of Occurrences: 1     Standing Expiration Date: 09/03/2016    Order Specific Question:  Reason for Exam (SYMPTOM  OR DIAGNOSIS REQUIRED)    Answer:  injury peristent lateral ankle pain X 2 months    Order Specific Question:  Is the patient pregnant?    Answer:  No    Order Specific Question:  Preferred imaging location?    Answer:  External     Laroy Apple, MD [facility] 07/05/2015, 4:08 PM

## 2015-07-05 NOTE — Patient Instructions (Signed)
Great to see you!  You have a sprained ankle, Rest, ICe, Compression, and lkevation is the mainstay of treatment  Please come back if you are not improving as expected  Ankle Sprain An ankle sprain is an injury to the strong, fibrous tissues (ligaments) that hold the bones of your ankle joint together.  CAUSES An ankle sprain is usually caused by a fall or by twisting your ankle. Ankle sprains most commonly occur when you step on the outer edge of your foot, and your ankle turns inward. People who participate in sports are more prone to these types of injuries.  SYMPTOMS   Pain in your ankle. The pain may be present at rest or only when you are trying to stand or walk.  Swelling.  Bruising. Bruising may develop immediately or within 1 to 2 days after your injury.  Difficulty standing or walking, particularly when turning corners or changing directions. DIAGNOSIS  Your caregiver will ask you details about your injury and perform a physical exam of your ankle to determine if you have an ankle sprain. During the physical exam, your caregiver will press on and apply pressure to specific areas of your foot and ankle. Your caregiver will try to move your ankle in certain ways. An X-ray exam may be done to be sure a bone was not broken or a ligament did not separate from one of the bones in your ankle (avulsion fracture).  TREATMENT  Certain types of braces can help stabilize your ankle. Your caregiver can make a recommendation for this. Your caregiver may recommend the use of medicine for pain. If your sprain is severe, your caregiver may refer you to a surgeon who helps to restore function to parts of your skeletal system (orthopedist) or a physical therapist. [place] ice to your injury for 1-2 days or as directed by your caregiver. Applying ice helps to reduce inflammation and pain.  Put ice in a plastic bag.  Place a towel between your skin and the bag.  Leave the  ice on for 15-20 minutes at a time, every 2 hours while you are awake.  Only take over-the-counter or prescription medicines for pain, discomfort, or fever as directed by your caregiver.  Elevate your injured ankle above the level of your heart as much as possible for 2-3 days.  If your caregiver recommends crutches, use them as instructed. Gradually put weight on the affected ankle. Continue to use crutches or a cane until you can walk without feeling pain in your ankle.  If you have a plaster splint, wear the splint as directed by your caregiver. Do not rest it on anything harder than a pillow for the first 24 hours. Do not put weight on it. Do not get it wet. You may take it off to take a shower or bath.  You may have been given an elastic bandage to wear around your ankle to provide support. If the elastic bandage is too tight (you have numbness or tingling in your foot or your foot becomes cold and blue), adjust the bandage to make it comfortable.  If you have an air splint, you may blow more air into it or let air out to make it more comfortable. You may take your splint off at night and before taking a shower or bath. Wiggle your toes in the splint several times per day to decrease swelling. SEEK MEDICAL CARE IF:   You have rapidly increasing bruising or swelling.  Your toes feel extremely cold or you lose feeling in your foot.  Your pain is not relieved with medicine. SEEK IMMEDIATE MEDICAL CARE IF:  Your toes are numb or blue.  You have severe pain that is increasing. MAKE SURE YOU:   Understand these instructions.  Will watch your condition.  Will get help right away if you are not doing well or get worse.   This information is not intended to replace advice given to you by your health care provider. Make sure you discuss any questions you have with your health care provider.   Document Released: 01/28/2005 Document Revised: 02/18/2014 Document Reviewed:  02/09/2011 Elsevier Interactive Patient Education [employer].

## 2015-07-24 ENCOUNTER — Encounter: Payer: Self-pay | Admitting: Pharmacist

## 2015-07-24 ENCOUNTER — Ambulatory Visit (INDEPENDENT_AMBULATORY_CARE_PROVIDER_SITE_OTHER): Payer: Medicaid Other | Admitting: Pharmacist

## 2015-07-24 NOTE — Patient Instructions (Signed)
Goal Fat grams per day = less than 55 grams Goal Calorie intake per day = 1500 calories About 400 calories per meal and 100 calories per snack Try to eat small meals and snacks every 3 to 4 hours.   Try to increase exercise as able - goal os 150 minutes per week.
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
## 2015-09-04 ENCOUNTER — Encounter: Payer: Self-pay | Admitting: Pharmacist

## 2015-09-04 ENCOUNTER — Ambulatory Visit (INDEPENDENT_AMBULATORY_CARE_PROVIDER_SITE_OTHER): Payer: Medicaid Other | Admitting: Pharmacist

## 2015-09-04 VITALS — BP 110/68 | HR 77 | Ht 64.0 in | Wt 259.0 lb

## 2015-09-04 DIAGNOSIS — R635 Abnormal weight gain: Secondary | ICD-10-CM | POA: Diagnosis not present

## 2015-09-04 DIAGNOSIS — Z6841 Body Mass Index (BMI) 40.0 and over, adult: Secondary | ICD-10-CM
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
## 2015-09-18 ENCOUNTER — Ambulatory Visit (INDEPENDENT_AMBULATORY_CARE_PROVIDER_SITE_OTHER): Payer: Medicaid Other

## 2015-09-18 ENCOUNTER — Encounter: Payer: Self-pay | Admitting: Nurse Practitioner

## 2015-09-18 ENCOUNTER — Ambulatory Visit (INDEPENDENT_AMBULATORY_CARE_PROVIDER_SITE_OTHER): Payer: Medicaid Other | Admitting: Nurse Practitioner

## 2015-09-18 VITALS — BP 115/74 | HR 67 | Temp 99.5°F | Ht 64.0 in | Wt 260.4 lb

## 2015-09-18 DIAGNOSIS — S99911A Unspecified injury of right ankle, initial encounter: Secondary | ICD-10-CM | POA: Diagnosis not present

## 2015-09-18 NOTE — Progress Notes (Signed)
   Subjective:    Patient ID: Kelly Fry, female    DOB: 06/15/1985, 30 y.o.   MRN: [id number]  HPI Patient in today with c/o right ankle pain- she sprained it 2 months ago and yesterday she stepped in a hole in the yard and everted the same ankle- sore to walk on.    Review of Systems  Constitutional: Negative.   HENT: Negative.   Respiratory: Negative.   Cardiovascular: Negative.   Genitourinary: Negative.   Neurological: Negative.   Psychiatric/Behavioral: Negative.   All other systems reviewed and are negative.      Objective:   Physical Exam  Constitutional: She is oriented to person, place, and time. She appears well-developed and well-nourished. No distress.  Cardiovascular: Normal rate, regular rhythm and normal heart sounds.   Pulmonary/Chest: Effort normal and breath sounds normal.  Neurological: She is alert and oriented to person, place, and time.  Skin: Skin is warm and dry.  Psychiatric: She has a normal mood and affect. Her behavior is normal. Judgment and thought content normal.   BP 115/74 (BP Location: Right Arm, Patient Position: Sitting, Cuff Size: Large)   Pulse 67   Temp 99.5 F (37.5 C) (Oral)   Ht 5\' 4"  (1.626 m)   Wt 260 lb 6.4 oz (118.1 kg)   BMI 44.70 kg/m   Right ankle sprain- no fracture on x ray- Preliminary reading by Ronnald Collum, FNP  [facility]       Assessment & Plan:   1. Ankle injury, right, initial encounter    Wrap in ace wrap Elevate when sitting Tylenol OTC  RTO prn  Mary-Margaret Hassell Done, FNP

## 2015-09-18 NOTE — Patient Instructions (Signed)
Ankle Sprain  An ankle sprain is an injury to the strong, fibrous tissues (ligaments) that hold the bones of your ankle joint together.   CAUSES  An ankle sprain is usually caused by a fall or by twisting your ankle. Ankle sprains most commonly occur when you step on the outer edge of your foot, and your ankle turns inward. People who participate in sports are more prone to these types of injuries.   SYMPTOMS    Pain in your ankle. The pain may be present at rest or only when you are trying to stand or walk.   Swelling.   Bruising. Bruising may develop immediately or within 1 to 2 days after your injury.   Difficulty standing or walking, particularly when turning corners or changing directions.  DIAGNOSIS   Your caregiver will ask you details about your injury and perform a physical exam of your ankle to determine if you have an ankle sprain. During the physical exam, your caregiver will press on and apply pressure to specific areas of your foot and ankle. Your caregiver will try to move your ankle in certain ways. An X-ray exam may be done to be sure a bone was not broken or a ligament did not separate from one of the bones in your ankle (avulsion fracture).   TREATMENT   Certain types of braces can help stabilize your ankle. Your caregiver can make a recommendation for this. Your caregiver may recommend the use of medicine for pain. If your sprain is severe, your caregiver may refer you to a surgeon who helps to restore function to parts of your skeletal system (orthopedist) or a physical therapist.  HOME CARE INSTRUCTIONS    Apply ice to your injury for 1-2 days or as directed by your caregiver. Applying ice helps to reduce inflammation and pain.    Put ice in a plastic bag.    Place a towel between your skin and the bag.    Leave the ice on for 15-20 minutes at a time, every 2 hours while you are awake.   Only take over-the-counter or prescription medicines for pain, discomfort, or fever as directed by  your caregiver.   Elevate your injured ankle above the level of your heart as much as possible for 2-3 days.   If your caregiver recommends crutches, use them as instructed. Gradually put weight on the affected ankle. Continue to use crutches or a cane until you can walk without feeling pain in your ankle.   If you have a plaster splint, wear the splint as directed by your caregiver. Do not rest it on anything harder than a pillow for the first 24 hours. Do not put weight on it. Do not get it wet. You may take it off to take a shower or bath.   You may have been given an elastic bandage to wear around your ankle to provide support. If the elastic bandage is too tight (you have numbness or tingling in your foot or your foot becomes cold and blue), adjust the bandage to make it comfortable.   If you have an air splint, you may blow more air into it or let air out to make it more comfortable. You may take your splint off at night and before taking a shower or bath. Wiggle your toes in the splint several times per day to decrease swelling.  SEEK MEDICAL CARE IF:    You have rapidly increasing bruising or swelling.   Your toes feel   extremely cold or you lose feeling in your foot.   Your pain is not relieved with medicine.  SEEK IMMEDIATE MEDICAL CARE IF:   Your toes are numb or blue.   You have severe pain that is increasing.  MAKE SURE YOU:    Understand these instructions.   Will watch your condition.   Will get help right away if you are not doing well or get worse.     This information is not intended to replace advice given to you by your health care provider. Make sure you discuss any questions you have with your health care provider.     Document Released: 01/28/2005 Document Revised: 02/18/2014 Document Reviewed: 02/09/2011  Elsevier Interactive Patient Education 2016 [employer]vier Inc[employer]

## 2015-10-19 ENCOUNTER — Encounter: Payer: Self-pay | Admitting: Family Medicine

## 2015-10-19 ENCOUNTER — Ambulatory Visit (INDEPENDENT_AMBULATORY_CARE_PROVIDER_SITE_OTHER): Payer: Medicaid Other | Admitting: Family Medicine

## 2015-10-19 VITALS — BP 118/76 | HR 81 | Temp 97.4°F | Ht 64.0 in | Wt 259.0 lb

## 2015-10-19 DIAGNOSIS — F39 Unspecified mood [affective] disorder: Secondary | ICD-10-CM | POA: Diagnosis not present

## 2015-10-19 DIAGNOSIS — E559 Vitamin D deficiency, unspecified: Secondary | ICD-10-CM

## 2015-10-19 DIAGNOSIS — M25571 Pain in right ankle and joints of right foot: Secondary | ICD-10-CM | POA: Diagnosis not present

## 2015-10-19 NOTE — Progress Notes (Signed)
   HPI  Patient presents today here for follow-up of chronic medical conditions.  Mood disorder Patient states her mood is very good lately. She denies any suicidal thoughts. Previously we had issues with tolerating Latuda, then issues with tolerating 15 mg of Abilify. She is doing very well with 10 mg of Abilify.  Right ankle pain is improving, she sprained it twice over the summer.  He is still taking vitamin D  PMH: Smoking status noted ROS: Per HPI  Objective: BP 118/76 (BP Location: Right Arm, Patient Position: Sitting, Cuff Size: Large)   Pulse 81   Temp 97.4 F (36.3 C) (Oral)   Ht 5\' 4"  (1.626 m)   Wt 259 lb (117.5 kg)   BMI 44.46 kg/m  Gen: NAD, alert, cooperative with exam HEENT: NCAT, CV: RRR, good S1/S2, no murmur Resp: CTABL, no wheezes, non-labored Ext: No edema, warm Neuro: Alert and oriented, No gross deficits  MSK:  Right ankle with no swelling  Assessment and plan:  # Mood disorder Doing well with Abilify, continue Annual labs next visit in about 6 months   # Vitamin D deficiency I consider this replaced, continue 2000 units daily of vitamin D3  # Ankle pain After ankle sprain 2, improving Follow-up as needed    Laroy Apple, MD [facility] 10/19/2015, 8:31 AM

## 2015-11-27 ENCOUNTER — Ambulatory Visit: Payer: Self-pay | Admitting: Pharmacist

## 2015-11-28 ENCOUNTER — Encounter: Payer: Self-pay | Admitting: Family Medicine

## 2015-12-16 ENCOUNTER — Other Ambulatory Visit: Payer: Self-pay | Admitting: Family Medicine

## 2015-12-16 DIAGNOSIS — F39 Unspecified mood [affective] disorder: Secondary | ICD-10-CM

## 2016-01-24 ENCOUNTER — Ambulatory Visit (INDEPENDENT_AMBULATORY_CARE_PROVIDER_SITE_OTHER): Payer: Medicaid Other | Admitting: Family Medicine

## 2016-01-24 ENCOUNTER — Encounter: Payer: Self-pay | Admitting: Family Medicine

## 2016-01-24 VITALS — BP 125/68 | HR 68 | Temp 97.2°F | Ht 64.0 in | Wt 262.1 lb

## 2016-01-24 DIAGNOSIS — J200 Acute bronchitis due to Mycoplasma pneumoniae: Secondary | ICD-10-CM

## 2016-01-24 MED ORDER — AZITHROMYCIN 250 MG PO TABS: ORAL_TABLET | ORAL | 0 refills | Status: DC

## 2016-01-24 MED ORDER — PSEUDOEPHEDRINE-GUAIFENESIN ER 120-1200 MG PO TB12: 1.0000 | ORAL_TABLET | Freq: Two times a day (BID) | ORAL | 0 refills | Status: DC

## 2016-01-24 MED ORDER — DM-DOXYLAMINE-ACETAMINOPHEN 30-12.5-1000 MG/30ML PO LIQD: 30.0000 mL | Freq: Every evening | ORAL | 0 refills | Status: AC

## 2016-01-24 NOTE — Progress Notes (Signed)
Subjective:  Patient ID: Kelly Fry, female    DOB: 1985/12/11  Age: 30 y.o. MRN: [id number]  CC: URI (pt here today c/o "having a cold" for the past 2 weeks)   HPI Kelly Fry presents for Patient presents with upper respiratory congestion. Rhinorrhea scant. There is moderate sore throat. Patient reports coughing frequently as well.no sputum noted. There is no fever no chills no sweats. The patient denies being short of breath.Chest is tight Onset was12 days ago. Gradually worsening in spite of home remedies.    History Kelly Fry has a past medical history of Medical history non-contributory and Miscarriage.   Kelly Fry has a past surgical history that includes No past surgeries and Wisdom tooth extraction (04/12/2015).   Kelly Fry family history includes Asthma in Kelly Fry brother and sister; COPD in Kelly Fry father; Early death in Kelly Fry mother; Heart disease in Kelly Fry mother; Hypertension in Kelly Fry father; Kidney disease in Kelly Fry father and sister; Stroke in Kelly Fry mother.Kelly Fry reports that Kelly Fry has never smoked. Kelly Fry has never used smokeless tobacco. Kelly Fry reports that Kelly Fry does not drink alcohol or use drugs.    ROS Review of Systems  Constitutional: Negative for activity change, appetite change, chills and fever.  HENT: Positive for congestion, postnasal drip, rhinorrhea and sinus pressure. Negative for ear discharge, ear pain, hearing loss, nosebleeds, sneezing and trouble swallowing.   Respiratory: Negative for chest tightness and shortness of breath.   Cardiovascular: Negative for chest pain and palpitations.  Skin: Negative for rash.    Objective:  BP 125/68   Pulse 68   Temp 97.2 F (36.2 C) (Oral)   Ht 5\' 4"  (1.626 m)   Wt 262 lb 2 oz (118.9 kg)   LMP 01/17/2016 (Approximate)   SpO2 100%   BMI 44.99 kg/m   BP Readings from Last 3 Encounters:  01/24/16 125/68  10/19/15 118/76  09/18/15 115/74    Wt Readings from Last 3 Encounters:  01/24/16 262 lb 2 oz (118.9 kg)  10/19/15 259 lb (117.5  kg)  09/18/15 260 lb 6.4 oz (118.1 kg)     Physical Exam  Constitutional: Kelly Fry appears well-developed and well-nourished.  HENT:  Head: Normocephalic and atraumatic.  Right Ear: Tympanic membrane and external ear normal. No decreased hearing is noted.  Left Ear: Tympanic membrane and external ear normal. No decreased hearing is noted.  Nose: Mucosal edema present. Right sinus exhibits no frontal sinus tenderness. Left sinus exhibits no frontal sinus tenderness.  Mouth/Throat: No oropharyngeal exudate or posterior oropharyngeal erythema.  Neck: No Brudzinski's sign noted.  Pulmonary/Chest: Breath sounds normal. No respiratory distress.  Lymphadenopathy:       Head (right side): No preauricular adenopathy present.       Head (left side): No preauricular adenopathy present.       Right cervical: No superficial cervical adenopathy present.      Left cervical: No superficial cervical adenopathy present.     Lab Results  Component Value Date   WBC 8.0 06/30/2015   HGB 9.4 (L) 11/17/2013   HCT 43.0 06/30/2015   PLT 249 06/30/2015   GLUCOSE 86 06/30/2015   CHOL 179 06/30/2015   TRIG 97 06/30/2015   HDL 51 06/30/2015   LDLCALC 109 (H) 06/30/2015   ALT 15 06/30/2015   AST 13 06/30/2015   NA 141 06/30/2015   K 4.5 06/30/2015   CL 102 06/30/2015   CREATININE 0.99 06/30/2015   BUN 15 06/30/2015   CO2 21 06/30/2015   TSH 2.740 06/30/2015  No results found.  Assessment & Plan:   Barbera was seen today for uri.  Diagnoses and all orders for this visit:  Acute bronchitis due to Mycoplasma pneumoniae  Other orders -     azithromycin (ZITHROMAX Z-PAK) 250 MG tablet; Take two right away Then one a day for the next 4 days. -     Pseudoephedrine-Guaifenesin [id number] MG TB12; Take 1 tablet by mouth 2 (two) times daily. For congestion -     DM-Doxylamine-Acetaminophen (DELSYM NIGHT TIME COUGH/COLD) 30-12.06-998 MG/30ML LIQD; Take 30 mLs by mouth every evening.     I have  discontinued Kelly Fry's Vitamin D (Ergocalciferol). I am also having Kelly Fry start on azithromycin, Pseudoephedrine-Guaifenesin, and DM-Doxylamine-Acetaminophen. Additionally, I am having Kelly Fry maintain Kelly Fry Norgestimate-Ethinyl Estradiol Triphasic and ARIPiprazole.  Meds ordered this encounter  Medications  . azithromycin (ZITHROMAX Z-PAK) 250 MG tablet    Sig: Take two right away Then one a day for the next 4 days.    Dispense:  6 each    Refill:  0  . Pseudoephedrine-Guaifenesin [id number] MG TB12    Sig: Take 1 tablet by mouth 2 (two) times daily. For congestion    Dispense:  20 each    Refill:  0  . DM-Doxylamine-Acetaminophen (DELSYM NIGHT TIME COUGH/COLD) 30-12.06-998 MG/30ML LIQD    Sig: Take 30 mLs by mouth every evening.    Dispense:  240 mL    Refill:  0     Follow-up: Return if symptoms worsen or fail to improve.  Claretta Fraise, M.D.

## 2016-02-27 ENCOUNTER — Encounter: Payer: Self-pay | Admitting: Family Medicine

## 2016-02-27 ENCOUNTER — Ambulatory Visit (INDEPENDENT_AMBULATORY_CARE_PROVIDER_SITE_OTHER): Payer: Medicaid Other | Admitting: Family Medicine

## 2016-02-27 VITALS — BP 124/87 | HR 103 | Temp 97.9°F | Ht 64.0 in | Wt 260.8 lb

## 2016-02-27 DIAGNOSIS — J0100 Acute maxillary sinusitis, unspecified: Secondary | ICD-10-CM

## 2016-02-27 MED ORDER — AMOXICILLIN-POT CLAVULANATE 875-125 MG PO TABS: 1.0000 | ORAL_TABLET | Freq: Two times a day (BID) | ORAL | 0 refills | Status: DC

## 2016-02-27 NOTE — Progress Notes (Signed)
   HPI  Patient presents today here with acute illness.  Patient's lines over the last several days she's had dry cough, runny nose, headache, and maxillary sinus pain and pressure.  She was treated with a Z-Pak last month for a plasma pneumonia, she had very quick improvement after starting azithromycin.  She's tolerating food and fluids normally.  She has mild dyspnea, persistent dry cough. She does not have any body aches or fevers.  PMH: Smoking status noted ROS: Per HPI  Objective: BP 124/87   Pulse (!) 103   Temp 97.9 F (36.6 C) (Oral)   Ht 5\' 4"  (1.626 m)   Wt 260 lb 12.8 oz (118.3 kg)   BMI 44.77 kg/m  Gen: NAD, alert, cooperative with exam HEENT: NCAT, tenderness to palpation of the maxillary sinuses bilaterally, Oropharynx clear and moist CV: RRR, good S1/S2, no murmur Resp: CTABL, no wheezes, non-labored Ext: No edema, warm Neuro: Alert and oriented, No gross deficits    Assessment and plan:  # Acute maxillary sinusitis Patient with severe facial pain and pressure for 4 days ongoing. Treat with Augmentin Discussed dangers of frequent antibiotic treatment. Low threshold for return if symptoms worsen or do not improve     Meds ordered this encounter  Medications  . amoxicillin-clavulanate (AUGMENTIN) 875-125 MG tablet    Sig: Take 1 tablet by mouth 2 (two) times daily.    Dispense:  20 tablet    Refill:  0    Laroy Apple, MD [facility] 02/27/2016, 2:16 PM

## 2016-02-27 NOTE — Patient Instructions (Signed)
Great to see you!  Start pro-biotics twice daily with the medication.   Be sure to finish all 10 days of meds.    Sinusitis, Adult Sinusitis is soreness and inflammation of your sinuses. Sinuses are hollow spaces in the bones around your face. They are located:  Around your eyes.  In the middle of your forehead.  Behind your nose.  In your cheekbones. Your sinuses and nasal passages are lined with a stringy fluid (mucus). Mucus normally drains out of your sinuses. When your nasal tissues get inflamed or swollen, the mucus can get trapped or blocked so air cannot flow through your sinuses. This lets bacteria, viruses, and funguses grow, and that leads to infection. Follow these instructions at home: Medicines  Take, use, or apply over-the-counter and prescription medicines only as told by your doctor. These may include nasal sprays.  If you were prescribed an antibiotic medicine, take it as told by your doctor. Do not stop taking the antibiotic even if you start to feel better. Hydrate and Humidify  Drink enough water to keep your pee (urine) clear or pale yellow.  Use a cool mist humidifier to keep the humidity level in your home above 50%.  Breathe in steam for 10-15 minutes, 3-4 times a day or as told by your doctor. You can do this in the bathroom while a hot shower is running.  Try not to spend time in cool or dry air. Rest  Rest as much as possible.  Sleep with your head raised (elevated).  Make sure to get enough sleep each night. General instructions  Put a warm, moist washcloth on your face 3-4 times a day or as told by your doctor. This will help with discomfort.  Wash your hands often with soap and water. If there is no soap and water, use hand sanitizer.  Do not smoke. Avoid being around people who are smoking (secondhand smoke).  Keep all follow-up visits as told by your doctor. This is important. Contact a doctor if:  You have a fever.  Your symptoms  get worse.  Your symptoms do not get better within 10 days. Get help right away if:  You have a very bad headache.  You cannot stop throwing up (vomiting).  You have pain or swelling around your face or eyes.  You have trouble seeing.  You feel confused.  Your neck is stiff.  You have trouble breathing. This information is not intended to replace advice given to you by your health care provider. Make sure you discuss any questions you have with your health care provider. Document Released: 07/17/2007 Document Revised: 09/24/2015 Document Reviewed: 11/23/2014 Elsevier Interactive Patient Education  2017 [employer].

## 2016-04-11 IMAGING — US US OB FOLLOW-UP
1 series · 12 of 28 positions shown · non-contrast
Comparison: none

[Series 1: us ob follow up · 12 of 60 slices shown]
[im 3/60]
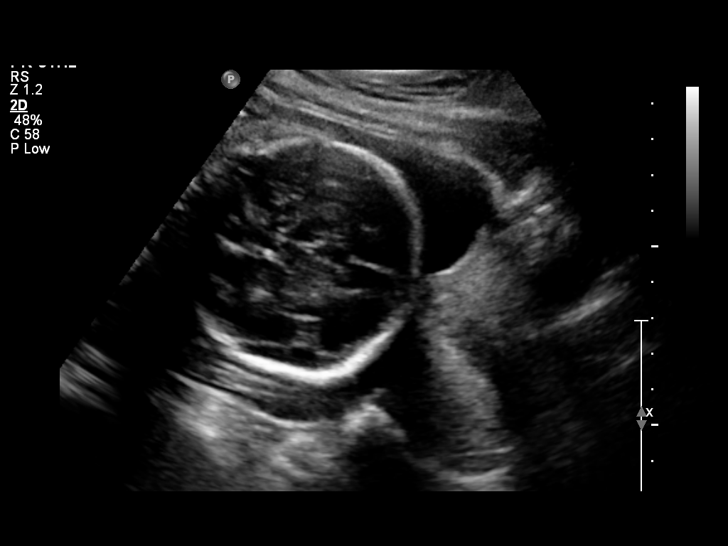
[im 7/60]
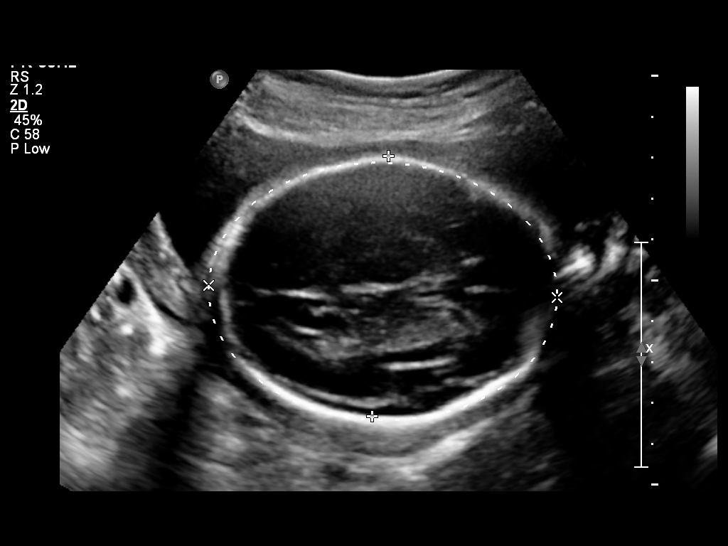
[im 11/60]
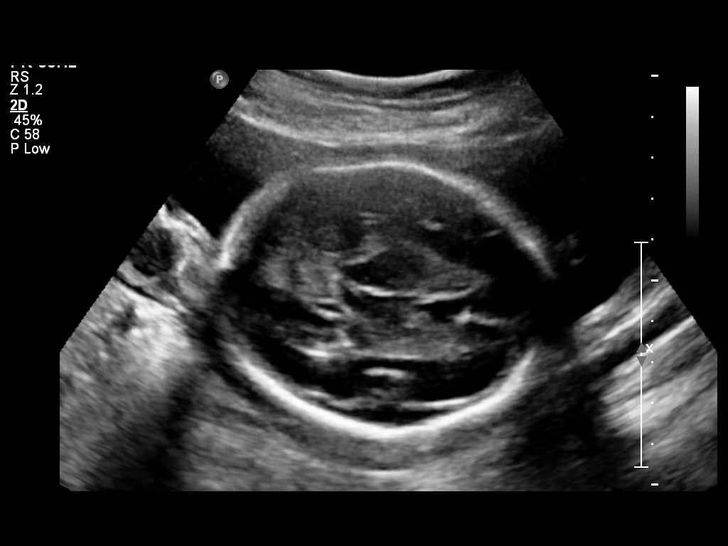
[im 18/60]
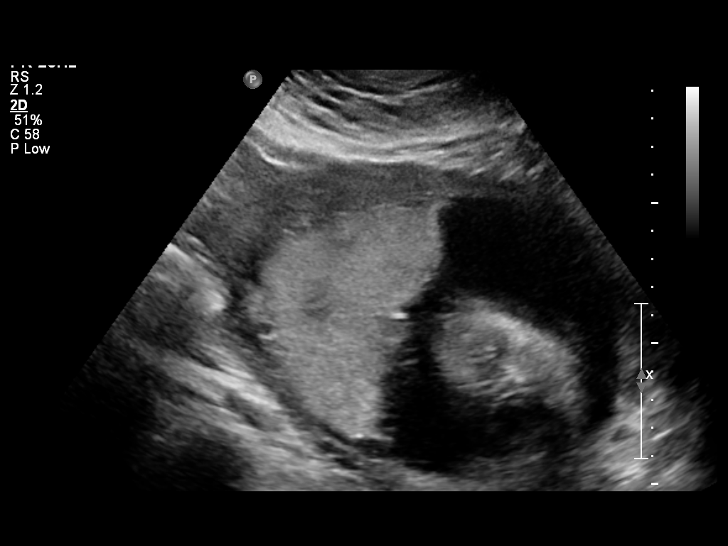
[im 22/60]
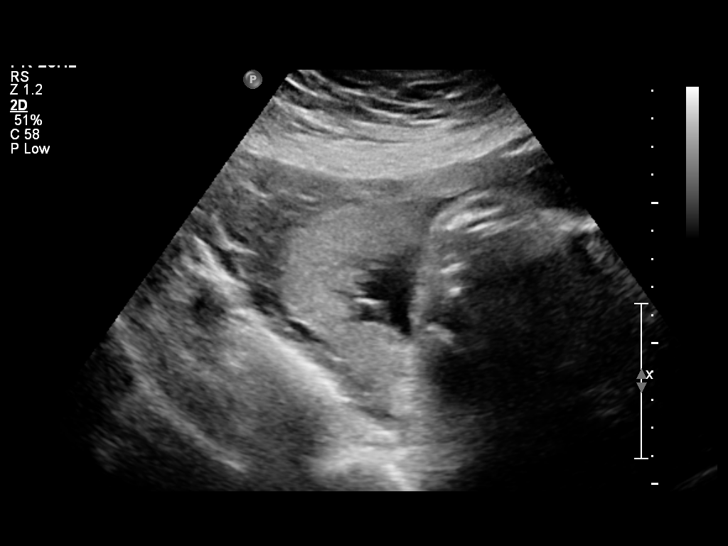
[im 27/60]
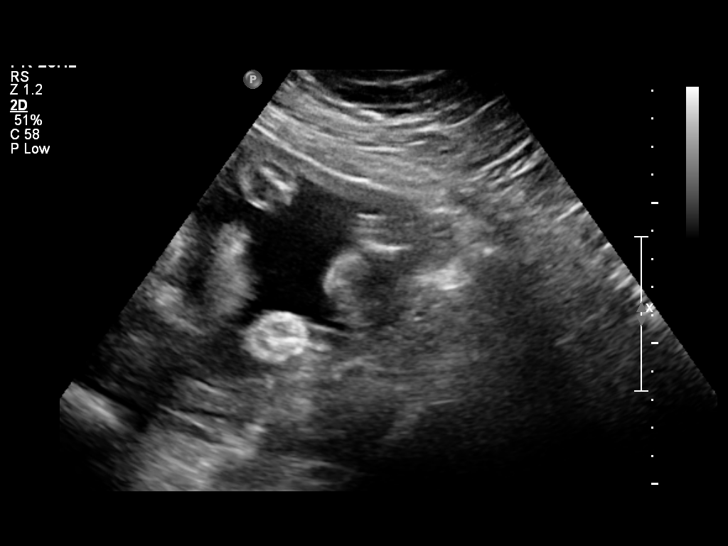
[im 33/60]
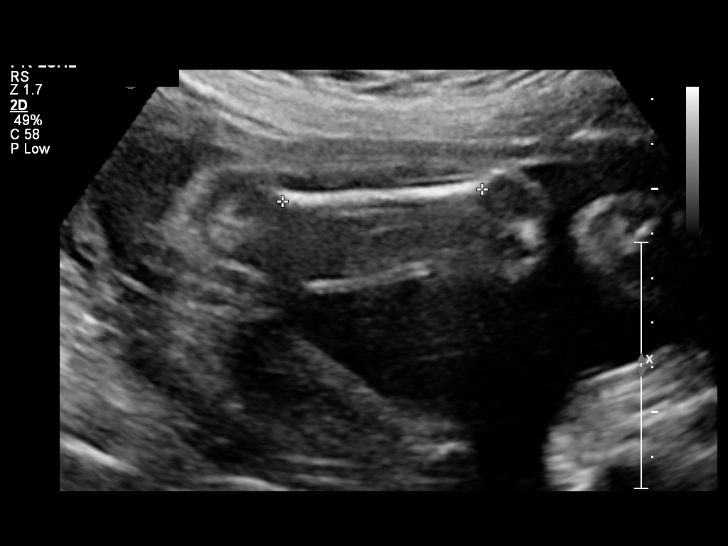
[im 38/60]
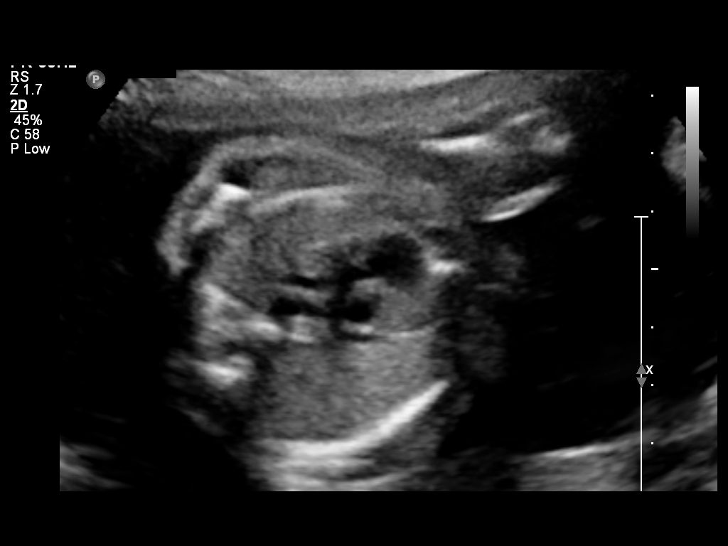
[im 42/60]
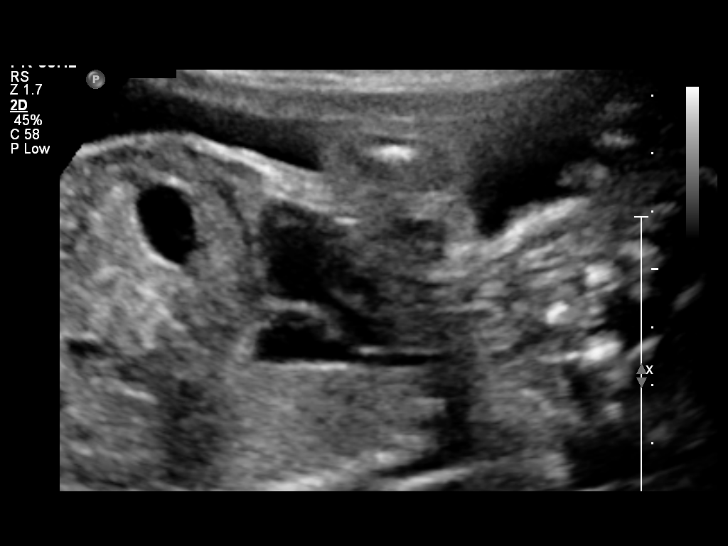
[im 49/60]
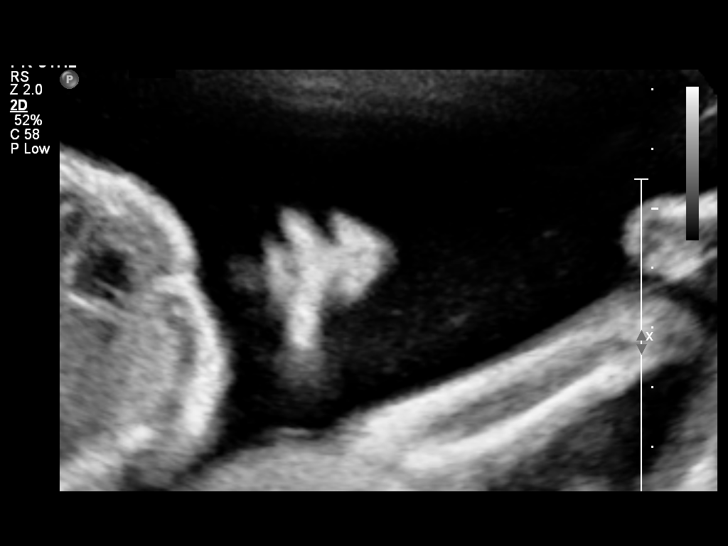
[im 53/60]
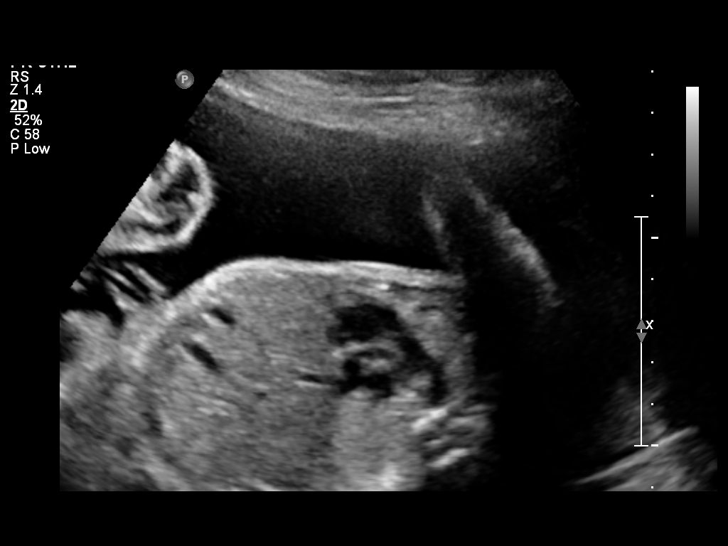
[im 57/60]
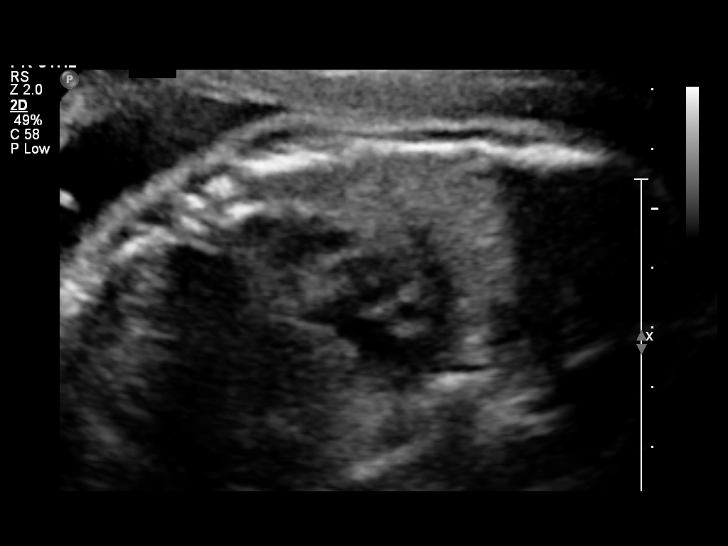

[12 of 28 positions shown; findings below may reference images not displayed]

OBSTETRICS REPORT
                      (Signed Final 08/16/2013 [DATE])

Service(s) Provided

 US OB FOLLOW UP                                       76816.1
Indications

 Follow-up incomplete fetal anatomic evaluation
Fetal Evaluation

 Num Of Fetuses:    1
 Fetal Heart Rate:  139                          bpm
 Cardiac Activity:  Observed
 Presentation:      Cephalic
 Placenta:          Posterior Fundal, above
                    cervical os
 P. Cord            Visualized
 Insertion:

 Amniotic Fluid
 AFI FV:      Subjectively within normal limits
                                             Larg Pckt:    6.36  cm
Biometry

 BPD:     63.7  mm     G. Age:  25w 5d                CI:        74.42   70 - 86
                                                      FL/HC:      19.6   18.7 -

 HC:     234.4  mm     G. Age:  25w 3d       41  %    HC/AC:      1.10   1.04 -

 AC:     213.9  mm     G. Age:  25w 6d       64  %    FL/BPD:     72.1   71 - 87
 FL:      45.9  mm     G. Age:  25w 2d       39  %    FL/AC:      21.5   20 - 24
 HUM:     41.3  mm     G. Age:  25w 0d       39  %

 Est. FW:     831  gm    1 lb 13 oz      62  %
Gestational Age

 LMP:           25w 1d        Date:  02/21/13                 EDD:   11/28/13
 U/S Today:     25w 4d                                        EDD:   11/25/13
 Best:          25w 1d     Det. By:  LMP  (02/21/13)          EDD:   11/28/13
Anatomy

 Cranium:          Appears normal         Aortic Arch:      Appears normal
 Fetal Cavum:      Previously seen        Ductal Arch:      Appears normal
 Ventricles:       Appears normal         Diaphragm:        Previously seen
 Choroid Plexus:   Previously seen        Stomach:          Appears normal, left
                                                            sided
 Cerebellum:       Previously seen        Abdomen:          Previously seen
 Posterior Fossa:  Previously seen        Abdominal Wall:   Previously seen
 Nuchal Fold:      Not applicable (>20    Cord Vessels:     Previously seen
                   wks GA)
 Face:             Orbits previously      Kidneys:          Appear normal
                   seen
 Lips:             Previously seen        Bladder:          Appears normal
 Heart:            Appears normal         Spine:            Previously seen
                   (4CH, axis, and
                   situs)
 RVOT:             Appears normal         Lower             Previously seen
                                          Extremities:
 LVOT:             Appears normal         Upper             Previously seen
                                          Extremities:

 Other:  Fetus appears to be a female. Technically difficult due to maternal
         habitus and fetal position.
Cervix Uterus Adnexa

 Cervical Length:    3.86     cm

 Cervix:       Normal appearance by transabdominal scan.
 Uterus:       No abnormality visualized.
 Cul De Sac:   No free fluid seen.
 Left Ovary:    Not visualized.
 Right Ovary:   Not visualized.
 Adnexa:     No abnormality visualized.
Impression

 Single IUP at 25w 1d
 Normal interval anatomy - the anatomic survey is now
 complete
 Fetal growth is appropriate (62nd %tile)
 Posterior fundal placenta
 Normal amniotic fluid volume
Recommendations

 Follow-up ultrasounds as clinically indicated.

## 2016-04-18 ENCOUNTER — Ambulatory Visit (INDEPENDENT_AMBULATORY_CARE_PROVIDER_SITE_OTHER): Payer: Medicaid Other | Admitting: Family Medicine

## 2016-04-18 ENCOUNTER — Encounter: Payer: Self-pay | Admitting: Family Medicine

## 2016-04-18 VITALS — BP 114/82 | HR 100 | Temp 97.4°F | Ht 64.0 in | Wt 270.2 lb

## 2016-04-18 DIAGNOSIS — IMO0001 Reserved for inherently not codable concepts without codable children: Secondary | ICD-10-CM

## 2016-04-18 DIAGNOSIS — F39 Unspecified mood [affective] disorder: Secondary | ICD-10-CM

## 2016-04-18 DIAGNOSIS — E669 Obesity, unspecified: Secondary | ICD-10-CM | POA: Diagnosis not present

## 2016-04-18 DIAGNOSIS — E559 Vitamin D deficiency, unspecified: Secondary | ICD-10-CM

## 2016-04-18 DIAGNOSIS — Z6841 Body Mass Index (BMI) 40.0 and over, adult: Secondary | ICD-10-CM

## 2016-04-18 LAB — BAYER DCA HB A1C WAIVED: HB A1C (BAYER DCA - WAIVED): 5 % (ref <7.0)

## 2016-04-18 NOTE — Patient Instructions (Signed)
Great to see you!  Try to increase you aerobic exercise ( even a brisk walk is good!) to 100 minutes a week ( 20 minute walk 5 daya week is a wonderful start)  Come back in 6 months unless you need [place] sooner.

## 2016-04-18 NOTE — Progress Notes (Signed)
   HPI  Patient presents today here to follow-up for chronic medical conditions.  Mood disorder Symptoms were previously primarily anxiety, there are some concern for bipolar disorder Patient has stopped Abilify and is doing very well. Anxiety is very manageable with biofeedback currently  Vitamin D deficiency No supplementation.  Obesity Patient has not been very physically active throughout the winter, she has plans to get out and start walking again. She's not been watching her diet very well either. She is not fasting today  She had fast food before coming this morning  PMH: Smoking status noted ROS: Per HPI  Objective: BP 114/82   Pulse 100   Temp 97.4 F (36.3 C) (Oral)   Ht '5\' 4"'$  (1.626 m)   Wt 270 lb 3.2 oz (122.6 kg)   BMI 46.38 kg/m  Gen: NAD, alert, cooperative with exam HEENT: NCAT CV: RRR, good S1/S2, no murmur Resp: CTABL, no wheezes, non-labored Ext: No edema, warm Neuro: Alert and oriented, No gross deficits  Assessment and plan:  # Mood disorder Stable, and have it patient is doing well without Abilify, however if she has needed it again we will restart. Labs  # Class III obesity Worsening, discussed therapeutic last L changes Stopping Abilify will help this as well  # Vitamin D deficiency Rechecking labs, clinically stable    Orders Placed This Encounter  Procedures  . CMP14+EGFR  . CBC with Differential/Platelet  . Bayer DCA Hb A1c Waived  . LDL Cholesterol, Direct  . VITAMIN D 25 Hydroxy (Vit-D Deficiency, Fractures)     Laroy Apple, MD [facility] 04/18/2016, 8:34 AM

## 2016-04-19 ENCOUNTER — Other Ambulatory Visit: Payer: Self-pay | Admitting: Family Medicine

## 2016-04-19 LAB — CBC WITH DIFFERENTIAL/PLATELET
BASOS ABS: 0 10*3/uL (ref 0.0–0.2)
BASOS: 0 %
EOS (ABSOLUTE): 0.2 10*3/uL (ref 0.0–0.4)
Eos: 3 %
Hematocrit: 39.2 % (ref 34.0–46.6)
Hemoglobin: 13.4 g/dL (ref 11.1–15.9)
IMMATURE GRANS (ABS): 0 10*3/uL (ref 0.0–0.1)
IMMATURE GRANULOCYTES: 0 %
LYMPHS: 29 %
Lymphocytes Absolute: 2 10*3/uL (ref 0.7–3.1)
MCH: 29.2 pg (ref 26.6–33.0)
MCHC: 34.2 g/dL (ref 31.5–35.7)
MCV: 85 fL (ref 79–97)
Monocytes Absolute: 0.4 10*3/uL (ref 0.1–0.9)
Monocytes: 6 %
NEUTROS PCT: 62 %
Neutrophils Absolute: 4.2 10*3/uL (ref 1.4–7.0)
PLATELETS: 240 10*3/uL (ref 150–379)
RBC: 4.59 x10E6/uL (ref 3.77–5.28)
RDW: 13.9 % (ref 12.3–15.4)
WBC: 6.8 10*3/uL (ref 3.4–10.8)

## 2016-04-19 LAB — CMP14+EGFR
A/G RATIO: 1.5 (ref 1.2–2.2)
ALT: 19 IU/L (ref 0–32)
AST: 12 IU/L (ref 0–40)
Albumin: 4 g/dL (ref 3.5–5.5)
Alkaline Phosphatase: 70 IU/L (ref 39–117)
BUN/Creatinine Ratio: 16 (ref 9–23)
BUN: 14 mg/dL (ref 6–20)
Bilirubin Total: 0.3 mg/dL (ref 0.0–1.2)
CALCIUM: 8.9 mg/dL (ref 8.7–10.2)
CO2: 23 mmol/L (ref 18–29)
Chloride: 101 mmol/L (ref 96–106)
Creatinine, Ser: 0.89 mg/dL (ref 0.57–1.00)
GFR calc Af Amer: 102 mL/min/{1.73_m2} (ref >59)
GFR, EST NON AFRICAN AMERICAN: 88 mL/min/{1.73_m2} (ref >59)
GLUCOSE: 101 mg/dL — AB (ref 65–99)
Globulin, Total: 2.7 g/dL (ref 1.5–4.5)
POTASSIUM: 3.8 mmol/L (ref 3.5–5.2)
Sodium: 140 mmol/L (ref 134–144)
Total Protein: 6.7 g/dL (ref 6.0–8.5)

## 2016-04-19 LAB — VITAMIN D 25 HYDROXY (VIT D DEFICIENCY, FRACTURES): VIT D 25 HYDROXY: 24.6 ng/mL — AB (ref 30.0–100.0)

## 2016-04-19 LAB — LDL CHOLESTEROL, DIRECT: LDL Direct: 111 mg/dL — ABNORMAL HIGH (ref 0–99)

## 2016-04-19 MED ORDER — VITAMIN D (ERGOCALCIFEROL) 1.25 MG (50000 UNIT) PO CAPS: 50000.0000 [IU] | ORAL_CAPSULE | ORAL | 0 refills | Status: DC

## 2016-05-13 ENCOUNTER — Telehealth: Payer: Self-pay | Admitting: Family Medicine

## 2016-05-13 NOTE — Telephone Encounter (Signed)
Call given to triage nurse °

## 2016-05-14 ENCOUNTER — Ambulatory Visit (INDEPENDENT_AMBULATORY_CARE_PROVIDER_SITE_OTHER): Payer: Medicaid Other | Admitting: Family Medicine

## 2016-05-14 ENCOUNTER — Encounter: Payer: Self-pay | Admitting: Family Medicine

## 2016-05-14 VITALS — BP 127/87 | HR 92 | Temp 97.7°F | Ht 64.0 in | Wt 272.0 lb

## 2016-05-14 DIAGNOSIS — M542 Cervicalgia: Secondary | ICD-10-CM

## 2016-05-14 MED ORDER — PREDNISONE 20 MG PO TABS: ORAL_TABLET | ORAL | 0 refills | Status: DC

## 2016-05-14 MED ORDER — CYCLOBENZAPRINE HCL 10 MG PO TABS: 10.0000 mg | ORAL_TABLET | Freq: Three times a day (TID) | ORAL | 0 refills | Status: DC | PRN

## 2016-05-14 NOTE — Progress Notes (Signed)
   HPI  Patient presents today here with neck pain.  Pain began in his left posterior shoulder pain that radiated around to the anterior shoulder and upper chest at times. Patient has developed more radiation to the neck and some tingling of the left fingertips.  Patient states that at that time she had severe pain and almost begin the blackout. She caught herself while going up stairs at that time.  She's tried ibuprofen with not much improvement.   PMH: Smoking status noted ROS: Per HPI  Objective: BP 127/87   Pulse 92   Temp 97.7 F (36.5 C) (Oral)   Ht 5\' 4"  (1.626 m)   Wt 272 lb (123.4 kg)   BMI 46.69 kg/m  Gen: NAD, alert, cooperative with exam HEENT: NCAT CV: RRR, good S1/S2, no murmur Resp: CTABL, no wheezes, non-labored Ext: No edema, warm Neuro: Alert and oriented, No gross deficits MSK Mild tenderness to palpation midline cervical spine, also paraspinal muscle tenderness in the left in the cervical area Negative empty can test, full strength in the left upper extremity.  Assessment and plan:  # Neck pain With some evidence of nerve irritation with fingertip numbness and tingling. Treat with prednisone course, also given Flexeril for nighttime use. Discussed usual course of illness. Return to clinic with any concerns or worsening symptoms.   Meds ordered this encounter  Medications  . predniSONE (DELTASONE) 20 MG tablet    Sig: 2 po at same time daily for 5 days    Dispense:  10 tablet    Refill:  0  . cyclobenzaprine (FLEXERIL) 10 MG tablet    Sig: Take 1 tablet (10 mg total) by mouth 3 (three) times daily as needed for muscle spasms.    Dispense:  30 tablet    Refill:  0    Laroy Apple, MD [facility] 05/14/2016, 11:28 AM

## 2016-05-14 NOTE — Patient Instructions (Signed)
Great to see you!  Start prednisone early in the day today, take once a day with food in the morning.   Please let me know if you have any concerns.

## 2016-06-24 ENCOUNTER — Ambulatory Visit (INDEPENDENT_AMBULATORY_CARE_PROVIDER_SITE_OTHER): Payer: Medicaid Other | Admitting: Pediatrics

## 2016-06-24 ENCOUNTER — Encounter: Payer: Self-pay | Admitting: Pediatrics

## 2016-06-24 VITALS — BP 117/85 | HR 76 | Temp 98.4°F | Ht 64.0 in | Wt 280.0 lb

## 2016-06-24 DIAGNOSIS — D485 Neoplasm of uncertain behavior of skin: Secondary | ICD-10-CM | POA: Diagnosis not present

## 2016-06-24 NOTE — Progress Notes (Signed)
  Subjective:   Patient ID: Kelly Fry, female    DOB: 07/17/1985, 31 y.o.   MRN: [id number] CC: Nevus (Upper back, pain)  HPI: Kelly Fry is a 31 y.o. female presenting for Nevus (Upper back, pain)  Has been there for years Recently started aching, hurting more Middle of upper back No itching, no bleeding Not usually getting irritated or rubbed by clothing No other spots on her skin that she is worried about  Relevant past medical, surgical, family and social history reviewed. Allergies and medications reviewed and updated. History  Smoking Status  . Never Smoker  Smokeless Tobacco  . Never Used   ROS: Per HPI   Objective:    BP 117/85   Pulse 76   Temp 98.4 F (36.9 C) (Oral)   Ht 5\' 4"  (1.626 m)   Wt 280 lb (127 kg)   BMI 48.06 kg/m   Wt Readings from Last 3 Encounters:  06/24/16 280 lb (127 kg)  05/14/16 272 lb (123.4 kg)  04/18/16 270 lb 3.2 oz (122.6 kg)    Gen: NAD, alert, cooperative with exam, NCAT EYES: EOMI, no conjunctival injection, or no icterus CV: WWP Resp:  normal WOB Neuro: Alert and oriented Skin: upper back with 65mm by 65mm slightly raised brown flat topped papule, slightly variegated color on surface.   Assessment & Plan:  Kelly Fry was seen today for pain in mole.  Diagnoses and all orders for this visit:  Neoplasm of uncertain behavior of skin Discussed options, will proceed with biopsy as below. -     Pathology   After risks and benefits and procedure itself discussed in detail, pt agreed to proceed with biopsy of upper back skin lesion. Area was thoroughly cleaned and prepped with betadine. 81mL of 1% lidocaine with epi was injected under the the lesion. Using a dermablade, the lesion was removed, hemostasis achieved with silver nitrate stick x 1. Wound dressed, wound care discussed with pt. Pt tolerated well.   Follow up plan: As needed Assunta Found, MD [place]

## 2016-06-26 LAB — PATHOLOGY

## 2016-08-05 ENCOUNTER — Other Ambulatory Visit: Payer: Self-pay | Admitting: Family

## 2016-08-05 DIAGNOSIS — Z30011 Encounter for initial prescription of contraceptive pills: Secondary | ICD-10-CM

## 2016-08-30 ENCOUNTER — Telehealth: Payer: Self-pay | Admitting: Family Medicine

## 2016-08-30 ENCOUNTER — Ambulatory Visit (INDEPENDENT_AMBULATORY_CARE_PROVIDER_SITE_OTHER): Payer: Medicaid Other | Admitting: Family Medicine

## 2016-08-30 ENCOUNTER — Encounter: Payer: Self-pay | Admitting: Family Medicine

## 2016-08-30 VITALS — BP 124/89 | HR 82 | Temp 99.3°F | Ht 64.0 in | Wt 283.2 lb

## 2016-08-30 DIAGNOSIS — H7392 Unspecified disorder of tympanic membrane, left ear: Secondary | ICD-10-CM | POA: Diagnosis not present

## 2016-08-30 DIAGNOSIS — Z803 Family history of malignant neoplasm of breast: Secondary | ICD-10-CM

## 2016-08-30 DIAGNOSIS — K219 Gastro-esophageal reflux disease without esophagitis: Secondary | ICD-10-CM | POA: Diagnosis not present

## 2016-08-30 DIAGNOSIS — H6591 Unspecified nonsuppurative otitis media, right ear: Secondary | ICD-10-CM | POA: Diagnosis not present

## 2016-08-30 DIAGNOSIS — R0981 Nasal congestion: Secondary | ICD-10-CM | POA: Diagnosis not present

## 2016-08-30 MED ORDER — FLUTICASONE PROPIONATE 50 MCG/ACT NA SUSP: 2.0000 | Freq: Every day | NASAL | 6 refills | Status: DC

## 2016-08-30 MED ORDER — FAMOTIDINE 20 MG PO TABS: 20.0000 mg | ORAL_TABLET | Freq: Two times a day (BID) | ORAL | 5 refills | Status: DC

## 2016-08-30 MED ORDER — CEFDINIR 300 MG PO CAPS: 300.0000 mg | ORAL_CAPSULE | Freq: Two times a day (BID) | ORAL | 0 refills | Status: DC

## 2016-08-30 NOTE — Progress Notes (Signed)
HPI  Patient presents today here for follow-up from urgent care.  Patient states that she was seen about 2 weeks ago in urgent care for sinusitis. She is treated with amoxicillin but told to follow-up with PCP due to scarring in the left TM.  Patient denies any left TM symptoms. She denies recurrent ear infections or trauma to the left TM. She has normal hearing out of that ear.  She does report 2 day history of muffled hearing on the right, however no pain. She does have fullness feeling of the right ear.  She has continued cough and nasal congestion from what she describes with sinusitis, that did not improve with amoxicillin.  She also complains of approximately 2-3 years of persistent heartburn symptoms. She tries Tums with moderate improvement.  And also requesting mammogram, she states that she is not having problems, however her grandmother and her cousin were just diagnosed with breast cancer and she like to be vigilant about screening.  PMH: Smoking status noted ROS: Per HPI  Objective: BP 124/89   Pulse 82   Temp 99.3 F (37.4 C) (Oral)   Ht 5\' 4"  (1.626 m)   Wt 283 lb 3.2 oz (128.5 kg)   BMI 48.61 kg/m  Gen: NAD, alert, cooperative with exam HEENT: NCAT, oropharynx moist and clear, nares with moderate boggy mucosa, left TM with mild scarring stretching from 5:00 to 7:00 in a crescent shape, and other small areas area centrally located, right TM with large amount of mucus and loss of landmarks, mild erythema. CV: RRR, good S1/S2, no murmur Resp: CTABL, no wheezes, non-labored Ext: No edema, warm Neuro: Alert and oriented, No gross deficits  Assessment and plan:  # Mucoid otitis media of the right ear Only muffled hearing at this time, however I am concerned that she will likely develop pain in the ear over the next 2-3 days. Considering that Friday I have given her a course of Omnicef that she will start taking if she develops ear pain. She understands that  fullness and muffled hearing may reduce spontaneously if her eustachian tube drains.  # Abnormal tympanic membrane left ear Mild scarring, normal hearing, asymptomatic, consider ENT if recurrent issues  # GERD Trial of H2 blocker Symptoms for over 2 years. Persistent  # Nasal congestion Flonase Likely due to seasonal allergies  Family history of breast cancer Screening mammogram, May need ultrasound given how young she has    Orders Placed This Encounter  Procedures  . MM DIGITAL SCREENING BILATERAL    Standing Status:   Future    Standing Expiration Date:   10/31/2017    Order Specific Question:   Reason for Exam (SYMPTOM  OR DIAGNOSIS REQUIRED)    Answer:   screening    Order Specific Question:   Is the patient pregnant?    Answer:   No    Order Specific Question:   Preferred imaging location?    Answer:   External    Meds ordered this encounter  Medications  . famotidine (PEPCID) 20 MG tablet    Sig: Take 1 tablet (20 mg total) by mouth 2 (two) times daily.    Dispense:  60 tablet    Refill:  5  . cefdinir (OMNICEF) 300 MG capsule    Sig: Take 1 capsule (300 mg total) by mouth 2 (two) times daily. 1 po BID    Dispense:  20 capsule    Refill:  0  . fluticasone (FLONASE) 50 MCG/ACT nasal  spray    Sig: Place 2 sprays into both nostrils daily.    Dispense:  16 g    Refill:  Hanna, MD [facility] 08/30/2016, 8:32 AM

## 2016-08-30 NOTE — Patient Instructions (Signed)
Great to see you!  Come back in a month or so if your heartburn symptoms are not well controlled on famotidine.   Take omnicef if you develop pain of the ear, if it clears out then you can probably make it without taking it.   Try flonase for nasal congestion.

## 2016-09-02 NOTE — Telephone Encounter (Signed)
Family hx  Paternal grandmother  1st Kelly Fry - fathers side   Please address the need for early mammo

## 2016-09-02 NOTE — Telephone Encounter (Signed)
After reviewing guidleines I would actually recommend against it for now. Will ask nursing to let her know?  Laroy Apple, MD [facility] 09/02/2016, 5:31 PM

## 2016-09-10 NOTE — Telephone Encounter (Signed)
Because her family Hx is not in a primary relative it is not indicated at this time.   Due to her young age it is likley that mammo would not be an adequate screen.  I dont believe I can see a reason to convince insurance to cover early screening.   Laroy Apple, MD [facility] 09/10/2016, 5:24 PM

## 2016-09-10 NOTE — Telephone Encounter (Signed)
Patient does regular self breast exams, she has not felt any lumps or knots.  Is concerned because when her grandmother had breast cancer she never had those either.

## 2016-09-11 NOTE — Telephone Encounter (Signed)
Patient aware and verbalizes understanding. 

## 2016-10-04 ENCOUNTER — Ambulatory Visit (INDEPENDENT_AMBULATORY_CARE_PROVIDER_SITE_OTHER): Payer: Medicaid Other

## 2016-10-04 ENCOUNTER — Ambulatory Visit (INDEPENDENT_AMBULATORY_CARE_PROVIDER_SITE_OTHER): Payer: Medicaid Other | Admitting: Nurse Practitioner

## 2016-10-04 ENCOUNTER — Encounter: Payer: Self-pay | Admitting: Nurse Practitioner

## 2016-10-04 VITALS — BP 125/88 | HR 83 | Temp 98.9°F | Ht 64.0 in | Wt 285.0 lb

## 2016-10-04 DIAGNOSIS — M25571 Pain in right ankle and joints of right foot: Secondary | ICD-10-CM

## 2016-10-04 MED ORDER — PREDNISONE 20 MG PO TABS: ORAL_TABLET | ORAL | 0 refills | Status: DC

## 2016-10-04 NOTE — Patient Instructions (Signed)
Ankle Pain Many things can cause ankle pain, including an injury to the area and overuse of the ankle.The ankle joint holds your body weight and allows you to move around. Ankle pain can occur on either side or the back of one ankle or both ankles. Ankle pain may be sharp and burning or dull and aching. There may be tenderness, stiffness, redness, or warmth around the ankle. Follow these instructions at home: Activity  Rest your ankle as told by your health care provider. Avoid any activities that cause ankle pain.  Do exercises as told by your health care provider.  Ask your health care provider if you can drive. Using a brace, a bandage, or crutches  If you were given a brace: ? Wear it as told by your health care provider. ? Remove it when you take a bath or a shower. ? Try not to move your ankle very much, but wiggle your toes from time to time. This helps to prevent swelling.  If you were given an elastic bandage: ? Remove it when you take a bath or a shower. ? Try not to move your ankle very much, but wiggle your toes from time to time. This helps to prevent swelling. ? Adjust the bandage to make it more comfortable if it feels too tight. ? Loosen the bandage if you have numbness or tingling in your foot or if your foot turns cold and blue.  If you have crutches, use them as told by your health care provider. Continue to use them until you can walk without feeling pain in your ankle. Managing pain, stiffness, and swelling  Raise (elevate) your ankle above the level of your heart while you are sitting or lying down.  If directed, apply ice to the area: ? Put ice in a plastic bag. ? Place a towel between your skin and the bag. ? Leave the ice on for 20 minutes, 2-3 times per day. General instructions  Keep all follow-up visits as told by your health care provider. This is important.  Record this information that may be helpful for you and your health care provider: ? How  often you have ankle pain. ? Where the pain is located. ? What the pain feels like.  Take over-the-counter and prescription medicines only as told by your health care provider. Contact a health care provider if:  Your pain gets worse.  Your pain is not relieved with medicines.  You have a fever or chills.  You are having more trouble with walking.  You have new symptoms. Get help right away if:  Your foot, leg, toes, or ankle tingles or becomes numb.  Your foot, leg, toes, or ankle becomes swollen.  Your foot, leg, toes, or ankle turns pale or blue. This information is not intended to replace advice given to you by your health care provider. Make sure you discuss any questions you have with your health care provider. Document Released: 07/18/2009 Document Revised: 09/29/2015 Document Reviewed: 08/30/2014 Elsevier Interactive Patient Education  2017 Elsevier Inc.  

## 2016-10-04 NOTE — Progress Notes (Signed)
   Subjective:    Patient ID: Kelly Fry, female    DOB: Feb 27, 1985, 31 y.o.   MRN: [id number]  HPI Patient come sin today c/o pain in right ankle and foot. She says that he has sprained this ankle several times in the past. Started hurting about 1 week ago- denies injury this time. Rates pain 8/10 and is worse at night. Has tried ica and ibuprofen with o relief.    Review of Systems  Constitutional: Negative for activity change and appetite change.  HENT: Negative.   Eyes: Negative for pain.  Respiratory: Negative for shortness of breath.   Cardiovascular: Negative for chest pain, palpitations and leg swelling.  Gastrointestinal: Negative for abdominal pain.  Endocrine: Negative for polydipsia.  Genitourinary: Negative.   Musculoskeletal: Positive for arthralgias (right ankle).  Skin: Negative for rash.  Neurological: Negative for dizziness, weakness and headaches.  Hematological: Does not bruise/bleed easily.  Psychiatric/Behavioral: Negative.   All other systems reviewed and are negative.      Objective:   Physical Exam  Constitutional: She is oriented to person, place, and time. She appears well-developed and well-nourished. No distress.  Cardiovascular: Normal rate and regular rhythm.   Pulmonary/Chest: Effort normal and breath sounds normal.  Musculoskeletal:  No right ankle edema or effusion Pain on palpation medially and laterally FROM with pain in inversion and eversion  Neurological: She is alert and oriented to person, place, and time.  Skin: Skin is warm.  Psychiatric: She has a normal mood and affect. Her behavior is normal. Judgment and thought content normal.   BP 125/88   Pulse 83   Temp 98.9 F (37.2 C) (Oral)   Ht 5\' 4"  (1.626 m)   Wt 285 lb (129.3 kg)   BMI 48.92 kg/m   Right ankle xray- normal- no acute findings     Assessment & Plan:  1. Acute right ankle pain Wrap when up walking Continue to ice BID If no better in 1 week will do ortho  referral - DG Ankle Complete Right; Future  Mary-Margaret Hassell Done, FNP

## 2016-10-09 ENCOUNTER — Telehealth: Payer: Self-pay | Admitting: Family Medicine

## 2016-10-09 NOTE — Telephone Encounter (Signed)
Please review and advise.

## 2016-10-10 MED ORDER — PANTOPRAZOLE SODIUM 40 MG PO TBEC: 40.0000 mg | DELAYED_RELEASE_TABLET | Freq: Every day | ORAL | 3 refills | Status: DC

## 2016-10-10 NOTE — Telephone Encounter (Signed)
Pt aware of new med and directions

## 2016-10-10 NOTE — Telephone Encounter (Signed)
RX for PPi sent to [place], MD [facility] 10/10/2016, 7:36 AM

## 2016-10-15 ENCOUNTER — Telehealth: Payer: Self-pay | Admitting: Family Medicine

## 2016-10-15 DIAGNOSIS — M25571 Pain in right ankle and joints of right foot: Secondary | ICD-10-CM

## 2016-10-15 NOTE — Telephone Encounter (Signed)
Patient aware.

## 2016-10-15 NOTE — Telephone Encounter (Signed)
Referral placed.   Laroy Apple, MD [facility] 10/15/2016, 12:08 PM

## 2016-10-22 ENCOUNTER — Telehealth: Payer: Self-pay | Admitting: Nurse Practitioner

## 2016-10-24 ENCOUNTER — Encounter (INDEPENDENT_AMBULATORY_CARE_PROVIDER_SITE_OTHER): Payer: Self-pay | Admitting: Orthopaedic Surgery

## 2016-10-24 ENCOUNTER — Ambulatory Visit (INDEPENDENT_AMBULATORY_CARE_PROVIDER_SITE_OTHER): Payer: Medicaid Other | Admitting: Orthopaedic Surgery

## 2016-10-24 VITALS — Ht 64.0 in | Wt 280.0 lb

## 2016-10-24 DIAGNOSIS — M25571 Pain in right ankle and joints of right foot: Secondary | ICD-10-CM | POA: Diagnosis not present

## 2016-10-24 DIAGNOSIS — S93491D Sprain of other ligament of right ankle, subsequent encounter: Secondary | ICD-10-CM | POA: Diagnosis not present

## 2016-10-24 NOTE — Progress Notes (Signed)
Office Visit Note   Patient: Kelly Fry           Date of Birth: [birth year]           MRN: [id number] Visit Date: 10/24/2016              Requested by: Timmothy Euler, MD [place], [place] [id number] PCP: Timmothy Euler, MD   Assessment & Plan: Visit Diagnoses:  1. Pain in right ankle and joints of right foot   2. Sprain of anterior talofibular ligament of right ankle, subsequent encounter     Plan: We'll set her up for physical therapy program. She'll purchase a Swede-O ankle brace that she will use with a sock underneath it. I'll recheck her again in 5 weeks. We discussed the dieting weight loss and ankle strengthening program to improve her symptoms and decrease her likelihood of recurrent sprains. Recheck 5 weeks.  Follow-Up Instructions: Return in about 5 weeks (around 11/28/2016).   Orders:  Orders Placed This Encounter  Procedures  . Ambulatory referral to Physical Therapy   No orders of the defined types were placed in this encounter.     Procedures: No procedures performed   Clinical Data: No additional findings.   Subjective: Chief Complaint  Patient presents with  . Right Ankle - Pain    HPI 31 year old female originally sprained her right ankle 3 years ago. She denies any problems with her left ankle. Since her right ankle sprain she sprained it twice more the past 6 months with increased pain swelling and has feelings of instability. His torso and she is on her feet and she works at [employer]. She's had x-rays taken before and most recently these were repeated and were negative. She's used several different wraps which was a slide on sleeve. She's tried prednisone without relief no relief with over-the-counter anti-inflammatories. She has not been to any therapy. She is here with her 2 daughters.  Review of Systems phosphorus of reflux bronchitis, increased BMI at 48 and vitamin D deficiency. Positive for ongoing low back pain which  is chronic.   Objective: Vital Signs: Ht 5\' 4"  (1.626 m)   Wt 280 lb (127 kg)   BMI 48.06 kg/m   Physical Exam  Constitutional: She is oriented to person, place, and time. She appears well-developed.  HENT:  Head: Normocephalic.  Right Ear: External ear normal.  Left Ear: External ear normal.  Eyes: Pupils are equal, round, and reactive to light.  Neck: No tracheal deviation present. No thyromegaly present.  Cardiovascular: Normal rate.   Pulmonary/Chest: Effort normal.  Abdominal: Soft.  Neurological: She is alert and oriented to person, place, and time.  Skin: Skin is warm and dry.  Psychiatric: She has a normal mood and affect. Her behavior is normal.    Ortho Exam patient ambulates with a slight right ankle limp. She has some discomfort with toe walking and heel walking. Atrophy knees reach full extension. She has some the tenderness of the lumbar spine. Negative straight leg raising 90. Knee exam shows no effusion. She has tenderness over the right ankle anterolaterally. Tenderness over the peroneal tendons starting at the tip of the fibula distally. Some pain with resisted peroneal function 1+ anterior drawer. Opposite left ankle has negative anterior drawer. There is mild anterolateral swelling. No definite ankle effusion midfoot subtalar motion is normal pulses are normal plantar surface of her foot is normal.  Specialty Comments:  No specialty comments available.  Imaging: No results found.   PMFS History: Patient Active Problem List   Diagnosis Date Noted  . Vitamin D deficiency 07/04/2015  . GAD (generalized anxiety disorder) 06/30/2015  . Mood disorder ([place]) 03/28/2015  . BMI 40.0-44.9, adult ([place]) 12/21/2014  . Rh negative, antepartum 06/03/2013   Past Medical History:  Diagnosis Date  . Medical history non-contributory   . Miscarriage     Family History  Problem Relation Age of Onset  . Kidney disease Father   . COPD Father   . Hypertension Father     . Kidney disease Sister   . Asthma Sister   . Early death Mother   . Stroke Mother   . Heart disease Mother   . Asthma Brother     Past Surgical History:  Procedure Laterality Date  . NO PAST SURGERIES    . WISDOM TOOTH EXTRACTION  04/12/2015   Social History   Occupational History  . Not on file.   Social History Main Topics  . Smoking status: Never Smoker  . Smokeless tobacco: Never Used  . Alcohol use No  . Drug use: No  . Sexual activity: Yes    Birth control/ protection: IUD

## 2016-10-24 NOTE — Telephone Encounter (Signed)
Pt scheduled for 10/24/16 and made that appointment with Dr. Lorin Mercy

## 2016-10-30 ENCOUNTER — Telehealth (INDEPENDENT_AMBULATORY_CARE_PROVIDER_SITE_OTHER): Payer: Self-pay | Admitting: Orthopaedic Surgery

## 2016-10-30 NOTE — Telephone Encounter (Signed)
faxed

## 2016-10-30 NOTE — Telephone Encounter (Signed)
Patient called stating that she misplaced the referral for [facility].  She is needing another one faxed over to them at [phone].  Thank you.

## 2016-11-14 ENCOUNTER — Telehealth (HOSPITAL_COMMUNITY): Payer: Self-pay

## 2016-11-14 NOTE — Telephone Encounter (Signed)
She was crying due to bad news and wants to call us back next week to reschedule.

## 2016-11-15 ENCOUNTER — Ambulatory Visit (HOSPITAL_COMMUNITY): Payer: Medicaid Other

## 2016-11-27 ENCOUNTER — Ambulatory Visit (HOSPITAL_COMMUNITY): Payer: Medicaid Other | Attending: Orthopaedic Surgery

## 2016-11-27 DIAGNOSIS — M25571 Pain in right ankle and joints of right foot: Secondary | ICD-10-CM | POA: Insufficient documentation

## 2016-11-27 DIAGNOSIS — R2689 Other abnormalities of gait and mobility: Secondary | ICD-10-CM | POA: Diagnosis present

## 2016-11-27 DIAGNOSIS — R2241 Localized swelling, mass and lump, right lower limb: Secondary | ICD-10-CM

## 2016-11-27 DIAGNOSIS — R531 Weakness: Secondary | ICD-10-CM

## 2016-11-27 DIAGNOSIS — M25871 Other specified joint disorders, right ankle and foot: Secondary | ICD-10-CM | POA: Diagnosis present

## 2016-11-27 NOTE — Therapy (Signed)
[place], [place], [address] Phone: [phone]   Fax:  [phone]  Physical Therapy Evaluation  Patient Details  Name: Kelly Fry MRN: [id number] Date of Birth: 08/25/1985 Referring Provider: Marybelle Killings, MD  Encounter Date: 11/27/2016      PT End of Session - 11/27/16 1135    Visit Number 1   Number of Visits 11   Date for PT Re-Evaluation 12/18/16   Authorization Type Medicaid  A   Authorization Time Period 11/27/2016-01/01/2017   PT Start Time 1033   PT Stop Time 1117   PT Time Calculation (min) 44 min   Activity Tolerance Patient limited by pain;Patient tolerated treatment well   Behavior During Therapy [facility] for tasks assessed/performed      Past Medical History:  Diagnosis Date  . Medical history non-contributory   . Miscarriage     Past Surgical History:  Procedure Laterality Date  . NO PAST SURGERIES    . WISDOM TOOTH EXTRACTION  04/12/2015    There were no vitals filed for this visit.       Subjective Assessment - 11/27/16 1038    Subjective Patient notes she sustained a sprain of her right ankle about 3 years ago at first. She has had consecutive throughout the years. She notes that this year it's been really weak and any uneven service, slight twist sends shooting pains up her Rt. leg about 2" to superior talocrural joint. She notes that it has swelling when she stands on her feet for long periods of time.    Pertinent History repeated sprain over 3 years, about 4 times   Limitations Walking;Standing;House hold activities   How long can you stand comfortably? about 20 minutes, then have to sit   How long can you walk comfortably? about 20 minutes, then have to sit   Diagnostic tests xray completed Lorin Mercy, MD) [facility]    Patient Stated Goals To improve pain and strength   Currently in Pain? Yes   Pain Score 2    Pain Location Ankle   Pain Orientation Right   Pain Descriptors / Indicators Constant;Aching   Pain Type Chronic pain   Pain Radiating Towards Superior to ankle 2 inches   Pain Onset More than a month ago   Pain Frequency Constant   Aggravating Factors  Pushing on gas peddle, standing > 20 minutes   Pain Relieving Factors Nothing helps, tried ice and pain relievers             [facility] PT Assessment - 11/27/16 0001      Assessment   Medical Diagnosis Chronic Rt. Ankle Sprain   Referring Provider Marybelle Killings, MD   Onset Date/Surgical Date 02/28/16   Next MD Visit 01/01/2017   Prior Therapy no     Precautions   Precautions None     Restrictions   Weight Bearing Restrictions No     Balance Screen   Has the patient fallen in the past 6 months No   Has the patient had a decrease in activity level because of a fear of falling?  No   Is the patient reluctant to leave their home because of a fear of falling?  No     Prior Function   Level of Independence Independent   Vocation Requirements Standing 9 hour shifts     Observation/Other Assessments   Observations Bilateral pronation secondary to intrinsic weakness     Observation/Other Assessments-Edema  Edema Figure 8  Rt. 21.5 in; Lt 21.0 in     Sensation   Proprioception Impaired by gross assessment     Functional Tests   Functional tests Single leg stance     Single Leg Stance   Comments Bilateral 10 seconds with ankle strategies present; Rt. limited secondary to pain     AROM   Right Ankle Dorsiflexion 5   Right Ankle Plantar Flexion 50   Right Ankle Inversion 20   Right Ankle Eversion 20  uncomfortable   Left Ankle Dorsiflexion 10   Left Ankle Plantar Flexion 55   Left Ankle Inversion 30   Left Ankle Eversion 20     Strength   Right Ankle Dorsiflexion 4+/5   Right Ankle Plantar Flexion --  Assess with single leg heel raise; unable due to pain   Right Ankle Inversion 4-/5   Right Ankle Eversion 3+/5   Left Ankle Dorsiflexion 5/5   Left Ankle  Inversion 5/5   Left Ankle Eversion 5/5     Palpation   Palpation comment Irritation lateral and medial ligaments, posterior tibialis - moderate pressure     Special Tests    Special Tests Ankle/Foot Special Tests     Anterior Drawer Test   Findings Negative     Ambulation/Gait   Ambulation Distance (Feet) 266 Feet   Ambulation Surface Level   Gait Comments Stairs ascending/descending reciprocal pattern with slowed cadence secondary to pain Rt. ankle     Functional Gait  Assessment   Gait assessed  No            Objective measurements completed on examination: See above findings.          [place] Adult PT Treatment/Exercise - 11/27/16 0001      Ankle Exercises: Standing   SLS 8 bilat SLS; pain Rt. limited   Heel Raises 15 reps   Heel Raises Limitations pain limiting     Ankle Exercises: Seated   BAPS 10 reps                PT Education - 11/27/16 1133    Education provided Yes   Education Details Examine findings, POC, HEP. Pt educated on significance of chronic ankle sprain for stability and strength during ambulation at work, in community and at home; and the contribution of impairments to pain and swelling. Benefit from exercises and educated may have slight increased ache from working muscles.    Person(s) Educated Patient   Methods Explanation;Demonstration;Handout   Comprehension Verbalized understanding;Returned demonstration          PT Short Term Goals - 11/27/16 1155      PT SHORT TERM GOAL #1   Title Patient will be independent with her HEP to improve functional mobility and pain ratings.    Time 2   Period Weeks   Status New     PT SHORT TERM GOAL #2   Title Patient will have decreased swelling of Rt. ankle when assessed by figure 8 method to equal Lt.    Time 2   Period Weeks   Status New     PT SHORT TERM GOAL #3   Title Patient will report standing tolerance increased to >20 minutes to complete daily tasks at home without  pain >4/10.    Time 3   Period Weeks   Status New     PT SHORT TERM GOAL #4   Title Patient will have improved Rt. ankle ROM equal to Lt. without  increased pain.    Time 3   Period Weeks   Status New     PT SHORT TERM GOAL #5   Title Pt will be able to stand on one leg for 15 seconds to improve tolerance of weight-bearing with no increase in symptoms.    Time 3   Period Weeks   Status New           PT Long Term Goals - 11/27/16 1157      PT LONG TERM GOAL #1   Title Patient will have improved MMT grade by at least 1 and complete 20 single leg heel raises to improve stability and functional mobility of Rt. ankle.    Time 5   Period Weeks   Status New     PT LONG TERM GOAL #2   Title Patient will be able to stand on one leg for 30 seconds without increased pain or LOB to improve safety and quality of ambulation at work and with stairs.    Time 5   Period Weeks   Status New     PT LONG TERM GOAL #3   Title Patient will report increased tolerance to work load without pain at the end of her day >3/10 to improve quality of daily living.    Time 5   Period Weeks   Status New     PT LONG TERM GOAL #4   Title Patient will tolerate standing and walking with daily tasks >45 minutes without increase in symptoms.    Time 5   Period Weeks   Status New                Plan - 11/27/16 1151    Clinical Impression Statement Patient is a pleasant 31 year old female with a 3 year history of ankle sprains that contribute to increased pain and decreased activity tolerance. This past January she notes stepping into a hole and twisting her foot collapsed inward (inversion) and has been experiencing swelling and pain ever since. She reports in the past that her foot has been "twisted outward", which is where majority of her pain exists. During ambulation she has bilateral foot pronation and decreased weight bearing during stance phase on her Rt. Foot with reports of "popping" feeling  anterior talocrural joint. She is limited to length of time she can stand on her feet comfortably at home to approximately 20 minutes while completing daily tasks and is required to work 9 hours without ability to sit down leading to increased swelling end of her day. She presents with decreased strength, decreased stability and decreased ROM and will benefit from skilled physical therapy to improve quality of daily living and pain.    History and Personal Factors relevant to plan of care: Chronic recurrent sprains approximately 4   Clinical Presentation Stable   Clinical Decision Making Low   PT Frequency 2x / week   PT Duration Other (comment)  5 weeks   PT Treatment/Interventions ADLs/Self Care Home Management;Dry needling;Patient/family education;Electrical Stimulation;Cryotherapy;Gait training;Stair training;Therapeutic exercise;Functional mobility training;Balance training;Neuromuscular re-education;Passive range of motion;Manual techniques;Taping;Energy conservation   PT Next Visit Plan Review evaluation and HEP; focuse on ankle strength, proprioception, ROM; Manual STM for pain as needed   PT Home Exercise Plan Eval: Ankle circles x 15, ABCs, Red tband ankle 4 way x 10 each   Consulted and Agree with Plan of Care Patient      Patient will benefit from skilled therapeutic intervention in order to improve the  following deficits and impairments:  Abnormal gait, Decreased endurance, Decreased activity tolerance, Decreased strength, Pain, Decreased balance, Difficulty walking, Decreased mobility, Decreased range of motion  Visit Diagnosis: Pain in right ankle and joints of right foot  Decreased strength  Decreased mobility  Decreased proprioception of joint of foot, right  Localized swelling, mass and lump, right lower limb     Problem List Patient Active Problem List   Diagnosis Date Noted  . Vitamin D deficiency 07/04/2015  . GAD (generalized anxiety disorder) 06/30/2015  .  Mood disorder ([place]) 03/28/2015  . BMI 40.0-44.9, adult ([place]) 12/21/2014  . Rh negative, antepartum 06/03/2013   Starr Lake PT, DPT 12:04 PM, 11/27/16 [place], [place], [address] Phone: [phone]   Fax:  [phone]  Name: Kelly Fry MRN: [id number] Date of Birth: 12/25/1985

## 2016-11-27 NOTE — Patient Instructions (Signed)
  Ankle 4way with TB 15 repetitions, 3 second hold   All theraband exercise is slow and controlled. Do not let the band "bounce" back. A. Plantarflexion: "gas pedal." Keep knee straight.Band around "ball of foot" and press it away as far as possible and slowly return to neutral. Repeat. B. Dorsiflexion: start in neutral and pull theraband back toward you as far as possible. pause. return slowly. keep knee straight. C.Inversion: start neutral and bring band toward your midline without bending or twisting knee. D. Eversion: start neutral and press band out without bending or twisting knee.     ANKLE ABC's   While in a seated position, write out the alphabet in the air with your big toe.  Your ankle should be moving as you perform this.  X 15 each   ANKLE CIRCLES  Move your ankle in a circular pattern one direction for several repetitions and then reverse the direction x 15 each

## 2016-11-27 NOTE — Addendum Note (Signed)
Addended by: Starr Lake on: 11/27/2016 12:20 PM   Modules accepted: Orders

## 2016-11-28 ENCOUNTER — Ambulatory Visit (INDEPENDENT_AMBULATORY_CARE_PROVIDER_SITE_OTHER): Payer: Medicaid Other | Admitting: Orthopaedic Surgery

## 2016-12-03 ENCOUNTER — Telehealth (HOSPITAL_COMMUNITY): Payer: Self-pay | Admitting: Family Medicine

## 2016-12-03 ENCOUNTER — Encounter: Payer: Self-pay | Admitting: Family Medicine

## 2016-12-03 ENCOUNTER — Ambulatory Visit (HOSPITAL_COMMUNITY): Payer: Medicaid Other

## 2016-12-03 DIAGNOSIS — R2689 Other abnormalities of gait and mobility: Secondary | ICD-10-CM

## 2016-12-03 DIAGNOSIS — M25871 Other specified joint disorders, right ankle and foot: Secondary | ICD-10-CM

## 2016-12-03 DIAGNOSIS — R2241 Localized swelling, mass and lump, right lower limb: Secondary | ICD-10-CM

## 2016-12-03 DIAGNOSIS — R531 Weakness: Secondary | ICD-10-CM

## 2016-12-03 DIAGNOSIS — M25571 Pain in right ankle and joints of right foot: Secondary | ICD-10-CM | POA: Diagnosis not present

## 2016-12-03 NOTE — Therapy (Signed)
[place], [place], [address] Phone: [phone]   Fax:  [phone]  Physical Therapy Treatment  Patient Details  Name: Kelly Fry MRN: [id number] Date of Birth: 23-Nov-1985 Referring Provider: Marybelle Killings, MD  Encounter Date: 12/03/2016      PT End of Session - 12/03/16 0827    Visit Number 2   Number of Visits 4   Date for PT Re-Evaluation 12/18/16   Authorization Type Medicaid [place] A   Authorization Time Period 11/27/2016-12/18/2016; after initial 3 visits plan to request 2x/week for 4 weeks    PT Start Time 0818   PT Stop Time 0901   PT Time Calculation (min) 43 min   Activity Tolerance Patient limited by pain;Patient tolerated treatment well;No increased pain   Behavior During Therapy WFL for tasks assessed/performed      Past Medical History:  Diagnosis Date  . Medical history non-contributory   . Miscarriage     Past Surgical History:  Procedure Laterality Date  . NO PAST SURGERIES    . WISDOM TOOTH EXTRACTION  04/12/2015    There were no vitals filed for this visit.      Subjective Assessment - 12/03/16 0824    Subjective Pt reports she continues to have high pain scale Rt ankle, increases with weight bearing with standing and gait.  Reports compliance wiht HEP   Pertinent History repeated sprain over 3 years, about 4 times   Patient Stated Goals To improve pain and strength   Currently in Pain? Yes   Pain Score 8    Pain Location Ankle   Pain Orientation Right;Anterior;Lateral   Pain Descriptors / Indicators Constant;Sore;Aching   Pain Type Chronic pain   Pain Onset More than a month ago   Pain Frequency Constant   Aggravating Factors  Pushing on gas peddle, standing > 20 minutes   Pain Relieving Factors Nothing helps, tried ice and pain relievers   Effect of Pain on Daily Activities pain with weight bearing activities                         [facility] Adult  PT Treatment/Exercise - 12/03/16 0001      Manual Therapy   Manual Therapy Soft tissue mobilization;Edema management   Manual therapy comments Audelia Hives complete separate from rest of tx   Edema Management Retromassage with LE elevated   Soft tissue mobilization gastroc/ soleus, posterior tib     Ankle Exercises: Seated   ABC's 1 rep   Ankle Circles/Pumps 10 reps   BAPS Sitting;Level 3;10 reps  Slight increased pain with dorsiflexion and eversion   Other Seated Ankle Exercises toe extension wiht arch support 5x 3"     Ankle Exercises: Supine   T-Band RTB 10x each direction, c//o increased pain with eversion                PT Education - 12/03/16 0827    Education provided Yes   Education Details Reviewed goals, assured compliance with HEP and copy of eval given to pt.   Person(s) Educated Patient   Methods Explanation;Demonstration;Handout   Comprehension Verbalized understanding;Returned demonstration          PT Short Term Goals - 11/27/16 1155      PT SHORT TERM GOAL #1   Title Patient will be independent with her HEP to improve functional mobility and pain ratings.    Time 2   Period Weeks  Status New     PT SHORT TERM GOAL #2   Title Patient will have decreased swelling of Rt. ankle when assessed by figure 8 method to equal Lt.    Time 2   Period Weeks   Status New     PT SHORT TERM GOAL #3   Title Patient will report standing tolerance increased to >20 minutes to complete daily tasks at home without pain >4/10.    Time 3   Period Weeks   Status New     PT SHORT TERM GOAL #4   Title Patient will have improved Rt. ankle ROM equal to Lt. without increased pain.    Time 3   Period Weeks   Status New     PT SHORT TERM GOAL #5   Title Pt will be able to stand on one leg for 15 seconds to improve tolerance of weight-bearing with no increase in symptoms.    Time 3   Period Weeks   Status New           PT Long Term Goals - 11/27/16 1157      PT  LONG TERM GOAL #1   Title Patient will have improved MMT grade by at least 1 and complete 20 single leg heel raises to improve stability and functional mobility of Rt. ankle.    Time 5   Period Weeks   Status New     PT LONG TERM GOAL #2   Title Patient will be able to stand on one leg for 30 seconds without increased pain or LOB to improve safety and quality of ambulation at work and with stairs.    Time 5   Period Weeks   Status New     PT LONG TERM GOAL #3   Title Patient will report increased tolerance to work load without pain at the end of her day >3/10 to improve quality of daily living.    Time 5   Period Weeks   Status New     PT LONG TERM GOAL #4   Title Patient will tolerate standing and walking with daily tasks >45 minutes without increase in symptoms.    Time 5   Period Weeks   Status New               Plan - 12/03/16 6063    Clinical Impression Statement Reviewed goals, assured good form and compliance wiht HEP and copy of eval given to pt.  Began session with manual soft tissue mobilization to address edema and tight musculautre with reports of tenderness near posterior tib.  Therex focus on ankle mobility with most c/o of pain wiht eversion movements.  EOS pt reports decreased pain by 4/10 (was 8/10 initially).   PT Frequency 1x / week   PT Duration 3 weeks  2x/week x 4 weeks following initial 3   PT Treatment/Interventions ADLs/Self Care Home Management;Dry needling;Patient/family education;Electrical Stimulation;Cryotherapy;Gait training;Stair training;Therapeutic exercise;Functional mobility training;Balance training;Neuromuscular re-education;Passive range of motion;Manual techniques;Taping;Energy conservation   PT Next Visit Plan Next session begin towel crunches, inversion/eversion; focuse on ankle strength, proprioception, ROM; Manual STM for pain as needed   PT Home Exercise Plan Eval: Ankle circles x 15, ABCs, Red tband ankle 4 way x 10 each       Patient will benefit from skilled therapeutic intervention in order to improve the following deficits and impairments:  Abnormal gait, Decreased endurance, Decreased activity tolerance, Decreased strength, Pain, Decreased balance, Difficulty walking, Decreased mobility, Decreased range of  motion  Visit Diagnosis: Pain in right ankle and joints of right foot  Decreased strength  Decreased mobility  Decreased proprioception of joint of foot, right  Localized swelling, mass and lump, right lower limb     Problem List Patient Active Problem List   Diagnosis Date Noted  . Vitamin D deficiency 07/04/2015  . GAD (generalized anxiety disorder) 06/30/2015  . Mood disorder ([place]) 03/28/2015  . BMI 40.0-44.9, adult ([place]) 12/21/2014  . Rh negative, antepartum 06/03/2013   Ihor Austin, [place]; [place]  Aldona Lento 12/03/2016, 9:12 AM  [place], [place], [address] Phone: [phone]   Fax:  [phone]  Name: Kelly Fry MRN: [id number] Date of Birth: September 15, 1985

## 2016-12-03 NOTE — Telephone Encounter (Signed)
10/23 I spoke to patient and let her know about only coming 1x week and that we would give her a new schedule when she came back in.

## 2016-12-05 ENCOUNTER — Ambulatory Visit (HOSPITAL_COMMUNITY): Payer: Medicaid Other

## 2016-12-10 ENCOUNTER — Ambulatory Visit (HOSPITAL_COMMUNITY): Payer: Medicaid Other

## 2016-12-10 ENCOUNTER — Telehealth (HOSPITAL_COMMUNITY): Payer: Self-pay | Admitting: Family Medicine

## 2016-12-10 NOTE — Telephone Encounter (Signed)
12/10/16  left a message to cx because her daughter is extremely sick and will be taking her to the dr

## 2016-12-13 ENCOUNTER — Encounter: Payer: Self-pay | Admitting: Pediatrics

## 2016-12-13 ENCOUNTER — Encounter (HOSPITAL_COMMUNITY): Payer: Self-pay

## 2016-12-13 ENCOUNTER — Ambulatory Visit (INDEPENDENT_AMBULATORY_CARE_PROVIDER_SITE_OTHER): Payer: Medicaid Other | Admitting: Pediatrics

## 2016-12-13 VITALS — BP 126/85 | HR 80 | Temp 98.0°F | Resp 20 | Ht 64.0 in | Wt 289.0 lb

## 2016-12-13 DIAGNOSIS — J069 Acute upper respiratory infection, unspecified: Secondary | ICD-10-CM

## 2016-12-13 DIAGNOSIS — R6889 Other general symptoms and signs: Secondary | ICD-10-CM

## 2016-12-13 DIAGNOSIS — J4520 Mild intermittent asthma, uncomplicated: Secondary | ICD-10-CM

## 2016-12-13 LAB — VERITOR FLU A/B WAIVED
INFLUENZA A: NEGATIVE
INFLUENZA B: NEGATIVE

## 2016-12-13 MED ORDER — ALBUTEROL SULFATE HFA 108 (90 BASE) MCG/ACT IN AERS: 2.0000 | INHALATION_SPRAY | Freq: Four times a day (QID) | RESPIRATORY_TRACT | 2 refills | Status: DC | PRN

## 2016-12-13 NOTE — Progress Notes (Signed)
  Subjective:   Patient ID: Kelly Fry, female    DOB: [birth year], 31 y.o.   MRN: [id number] CC: URI symptoms HPI: Kelly Fry is a 31 y.o. female presenting for URI symptoms  Congestion, sore throat, coughing some, minimally productive started 3-4 days ago Daughter with similar symptoms Dry cough, feels SOB, wheezing at times  Temp yesterday to 102, no fever today Symptoms started the day before "felt like she got hit by a train" Appetite has been down  Not taking anything for symptoms  Relevant past medical, surgical, family and social history reviewed. Allergies and medications reviewed and updated. History  Smoking Status  . Never Smoker  Smokeless Tobacco  . Never Used   ROS: Per HPI   Objective:    BP 126/85   Pulse 80   Temp 98 F (36.7 C) (Oral)   Resp 20   Ht 5\' 4"  (1.626 m)   Wt 289 lb (131.1 kg)   SpO2 99%   BMI 49.61 kg/m   Wt Readings from Last 3 Encounters:  12/13/16 289 lb (131.1 kg)  10/24/16 280 lb (127 kg)  10/04/16 285 lb (129.3 kg)    Gen: NAD, alert, cooperative with exam, NCAT, slightly hoarse EYES: EOMI, no conjunctival injection, or no icterus ENT:  TMs dull pink b/l, OP with mild erythema LYMPH: no cervical LAD CV: NRRR, normal S1/S2, no murmur, distal pulses 2+ b/l Resp: moving air well, slightly prolonged forced exp phase, no overt wheezes, normal WOB Ext: No edema, warm Neuro: Alert and oriented  Assessment & Plan:  Diagnoses and all orders for this visit:  Acute URI Symptom care, return precautions discussed  Flu-like symptoms Flu neg -     Veritor Flu A/B Waived  Mild intermittent reactive airway disease without complication -     albuterol (PROVENTIL HFA;VENTOLIN HFA) 108 (90 Base) MCG/ACT inhaler; Inhale 2 puffs into the lungs every 6 (six) hours as needed for wheezing or shortness of breath.   Follow up plan: Return if symptoms worsen or fail to improve. Assunta Found, MD [place]

## 2016-12-17 ENCOUNTER — Telehealth: Payer: Self-pay | Admitting: Pediatrics

## 2016-12-17 ENCOUNTER — Encounter (HOSPITAL_COMMUNITY): Payer: Self-pay

## 2016-12-17 ENCOUNTER — Ambulatory Visit (HOSPITAL_COMMUNITY): Payer: Medicaid Other | Attending: Orthopaedic Surgery

## 2016-12-17 DIAGNOSIS — R2689 Other abnormalities of gait and mobility: Secondary | ICD-10-CM | POA: Diagnosis present

## 2016-12-17 DIAGNOSIS — R2241 Localized swelling, mass and lump, right lower limb: Secondary | ICD-10-CM | POA: Diagnosis present

## 2016-12-17 DIAGNOSIS — M25871 Other specified joint disorders, right ankle and foot: Secondary | ICD-10-CM

## 2016-12-17 DIAGNOSIS — M25571 Pain in right ankle and joints of right foot: Secondary | ICD-10-CM | POA: Diagnosis not present

## 2016-12-17 DIAGNOSIS — R531 Weakness: Secondary | ICD-10-CM

## 2016-12-17 NOTE — Therapy (Signed)
[place], [place], [address] Phone: [phone]   Fax:  [phone]  Physical Therapy Treatment  Patient Details  Name: Kelly Fry MRN: [id number] Date of Birth: [birth year] Referring Provider: Marybelle Killings, MD   Encounter Date: 12/17/2016  PT End of Session - 12/17/16 0858    Visit Number  3    Number of Visits  4    Date for PT Re-Evaluation  12/18/16    Authorization Type  Medicaid [place] A    Authorization Time Period  11/27/2016-12/18/2016; after initial 3 visits plan to request 2x/week for 4 weeks     PT Start Time  0815    PT Stop Time  0858    PT Time Calculation (min)  43 min    Activity Tolerance  Patient limited by pain;Patient tolerated treatment well;No increased pain    Behavior During Therapy  WFL for tasks assessed/performed       Past Medical History:  Diagnosis Date  . Medical history non-contributory   . Miscarriage     Past Surgical History:  Procedure Laterality Date  . NO PAST SURGERIES    . WISDOM TOOTH EXTRACTION  04/12/2015    There were no vitals filed for this visit.  Subjective Assessment - 12/17/16 0811    Subjective  Patient reports her ankle has been feeling better still painful. "feels like it needs to pop". Notes work has been easier to stand on it as well.     Pain Score  2     Pain Location  Ankle    Pain Orientation  Right                      [facility] Adult PT Treatment/Exercise - 12/17/16 0001      Manual Therapy   Manual Therapy  Soft tissue mobilization    Manual therapy comments  Audelia Hives complete separate from rest of tx    Soft tissue mobilization  anterior ankle into anterior tibialis mm belly and peroneals       Ankle Exercises: Seated   ABC's  1 rep    Ankle Circles/Pumps  10 reps CW/CCW   CW/CCW   Towel Crunch  Other (comment) 15 reps   15 reps   Towel Inversion/Eversion  Other (comment) 15 times   15 times   Marble Pickup   Lg & Med bead 10 each    Heel Raises  20 reps    Toe Raise  10 reps    BAPS  Sitting;Level 3;10 reps    Other Seated Ankle Exercises  toe extension wiht arch support 5x 3"    Other Seated Ankle Exercises  seated arch lift x 5      Ankle Exercises: Supine   T-Band  RTB 12x each             PT Education - 12/17/16 0858    Education provided  Yes    Education Details  Added intrinsic exercises to HEP    Person(s) Educated  Patient    Methods  Handout    Comprehension  Verbalized understanding;Returned demonstration       PT Short Term Goals - 11/27/16 1155      PT SHORT TERM GOAL #1   Title  Patient will be independent with her HEP to improve functional mobility and pain ratings.     Time  2    Period  Weeks    Status  New      PT SHORT TERM GOAL #2   Title  Patient will have decreased swelling of Rt. ankle when assessed by figure 8 method to equal Lt.     Time  2    Period  Weeks    Status  New      PT SHORT TERM GOAL #3   Title  Patient will report standing tolerance increased to >20 minutes to complete daily tasks at home without pain >4/10.     Time  3    Period  Weeks    Status  New      PT SHORT TERM GOAL #4   Title  Patient will have improved Rt. ankle ROM equal to Lt. without increased pain.     Time  3    Period  Weeks    Status  New      PT SHORT TERM GOAL #5   Title  Pt will be able to stand on one leg for 15 seconds to improve tolerance of weight-bearing with no increase in symptoms.     Time  3    Period  Weeks    Status  New        PT Long Term Goals - 11/27/16 1157      PT LONG TERM GOAL #1   Title  Patient will have improved MMT grade by at least 1 and complete 20 single leg heel raises to improve stability and functional mobility of Rt. ankle.     Time  5    Period  Weeks    Status  New      PT LONG TERM GOAL #2   Title  Patient will be able to stand on one leg for 30 seconds without increased pain or LOB to improve safety and  quality of ambulation at work and with stairs.     Time  5    Period  Weeks    Status  New      PT LONG TERM GOAL #3   Title  Patient will report increased tolerance to work load without pain at the end of her day >3/10 to improve quality of daily living.     Time  5    Period  Weeks    Status  New      PT LONG TERM GOAL #4   Title  Patient will tolerate standing and walking with daily tasks >45 minutes without increase in symptoms.     Time  5    Period  Weeks    Status  New            Plan - 12/17/16 2440    Clinical Impression Statement  Patient tolerated session well today with added intrinsic strengthening. She was given a handout to complete at home when able. She had increased tissue restrictions noted anterior ankle and superior into peroneal and anterior tibialis muscle belly. Slight decrease in pain post session to 1/10.     PT Frequency  1x / week    PT Duration  3 weeks 2x/week x 4 weeks following initial 3   2x/week x 4 weeks following initial 3   PT Treatment/Interventions  ADLs/Self Care Home Management;Dry needling;Patient/family education;Electrical Stimulation;Cryotherapy;Gait training;Stair training;Therapeutic exercise;Functional mobility training;Balance training;Neuromuscular re-education;Passive range of motion;Manual techniques;Taping;Energy conservation    PT Next Visit Plan  focuse on ankle strength, proprioception, ROM; Manual STM for pain as needed. trial bosu lunge and airex step ups next session  PT Home Exercise Plan  Eval: Ankle circles x 15, ABCs, Red tband ankle 4 way x 10 each       Patient will benefit from skilled therapeutic intervention in order to improve the following deficits and impairments:  Abnormal gait, Decreased endurance, Decreased activity tolerance, Decreased strength, Pain, Decreased balance, Difficulty walking, Decreased mobility, Decreased range of motion  Visit Diagnosis: Pain in right ankle and joints of right  foot  Decreased strength  Decreased mobility  Decreased proprioception of joint of foot, right  Localized swelling, mass and lump, right lower limb     Problem List Patient Active Problem List   Diagnosis Date Noted  . Vitamin D deficiency 07/04/2015  . GAD (generalized anxiety disorder) 06/30/2015  . Mood disorder ([place]) 03/28/2015  . BMI 40.0-44.9, adult ([place]) 12/21/2014  . Rh negative, antepartum 06/03/2013   Starr Lake PT, DPT 9:00 AM, 12/17/16 [place] [place] [address] [place], [place], [address] Phone: [phone]   Fax:  [phone]  Name: CHAYCE RULLO MRN: [id number] Date of Birth: 07-Apr-1985

## 2016-12-17 NOTE — Patient Instructions (Signed)
Foot Intrinsic 1  Position your foot flat on the floor. Place a spacer between your toes (similar to what is used during pedicures) and squeeze it by drawing your toes closer together. Make sure you do not curl your toes. Note the difference in space between the toes from the first to second picture. You can walk around home throughout day with spacers as well to activate throughout your day.    TOE INTRINSICS  IN THE SITTING POSITION, pick up small object (marble, cotton ball, or similar object) and place within a cup. Hold the object for 5 second count before placing in container. Complete 20 times each foot.       Seated Foot Intrinsic  1) Sitting in a chair barefoot, feet and knees hip distance apart 2) Keep your big toes down, and lift your outer 4 toes. Don't allow your knees to move. Repeat 20 times. 3) Keep your outer 4 toes down, lift your big toe. Don't allow your knees to move. Repeat 20 times.  Perform 1-3 times/day.      ARCH LIFTS              Repeat 20 times, 5 second hold   Step 1: Sit in a chair with both feet placed flat on the floor Step 2: Raise the arch of your foot by sliding your big toe toward your heel without curling your toes or lifting your heel Step 3: Hold the position for 6 seconds then relax and repeat for the recommended number of set and repetitions. Variations can be performed by moving the feet farther away from you or turning the foot inward or outward to challenge the muscles from different positions. Step 4: Once you feel comfortable performing the short foot movement you can gradually progress to performing the exercise while standing and then eventually from a single-leg standing position.      TOWEL CURLS - TOWEL SCRUNCHES  While seated, use a towel and draw it back towards you using your toes. Curl your toes inward. Be sure to keep your heel in contact with the floor the entire time.  Repeat 20 scrunches    Toe curls  Sitting with your foot on  the floor, curl toes and hold for 10 seconds then extend toes for 10 seconds.    Repeat 20 times    HEEL WALK  Raise up your toes and walk on your heels.  Take few steps forward and then a few steps backwards.     Repeat 20 times, 2 sets    TOE WALK  Raise up your heels and walk on your toes.  Take few steps forward and then a few steps backwards.     Repeat 20 times, 2 sets

## 2016-12-17 NOTE — Telephone Encounter (Signed)
Returned patient's phone call.  Patient states that she has been taking OTC medications for URI and when she over exerts herself she gets slightly dizzy.  Informed patient that she needs to keep her self well hydrated while taking OTC medications.  Patient verbalized understanding.

## 2016-12-19 ENCOUNTER — Encounter (HOSPITAL_COMMUNITY): Payer: Self-pay

## 2016-12-24 ENCOUNTER — Telehealth (HOSPITAL_COMMUNITY): Payer: Self-pay | Admitting: Family Medicine

## 2016-12-24 ENCOUNTER — Ambulatory Visit (HOSPITAL_COMMUNITY): Payer: Medicaid Other

## 2016-12-24 NOTE — Telephone Encounter (Signed)
12/24/16 pt cx today's appt and wanted to reschedule but we still haven't received her insurance authorization.  I called the patient and told her she did have an appt on 11/19 but we were still waiting for authorization from her insurance and then we could reschedule today's appt.

## 2016-12-24 NOTE — Telephone Encounter (Signed)
12/24/16  pt left a message that she had a sick child but does want a call back to reschedule

## 2016-12-26 ENCOUNTER — Ambulatory Visit (HOSPITAL_COMMUNITY): Payer: Self-pay

## 2016-12-30 ENCOUNTER — Ambulatory Visit (HOSPITAL_COMMUNITY): Payer: Medicaid Other

## 2016-12-31 ENCOUNTER — Encounter (HOSPITAL_COMMUNITY): Payer: Self-pay

## 2016-12-31 ENCOUNTER — Ambulatory Visit (HOSPITAL_COMMUNITY): Payer: Medicaid Other

## 2016-12-31 DIAGNOSIS — R531 Weakness: Secondary | ICD-10-CM

## 2016-12-31 DIAGNOSIS — M25571 Pain in right ankle and joints of right foot: Secondary | ICD-10-CM

## 2016-12-31 DIAGNOSIS — R2241 Localized swelling, mass and lump, right lower limb: Secondary | ICD-10-CM

## 2016-12-31 DIAGNOSIS — M25871 Other specified joint disorders, right ankle and foot: Secondary | ICD-10-CM

## 2016-12-31 DIAGNOSIS — R2689 Other abnormalities of gait and mobility: Secondary | ICD-10-CM

## 2016-12-31 NOTE — Patient Instructions (Signed)
Calf Stretch    Place hands on wall at shoulder height. Keeping back leg straight, bend front leg, feet pointing forward, heels flat on floor. Lean forward slightly until stretch is felt in calf of back leg. Hold stretch 30 seconds, breathing slowly in and out. Repeat stretch with other leg back. Complete 3 repetitions each day. Do 3 sessions per day. Variation: Use chair or table for support.  Copyright  VHI. All rights reserved.    STANDING CALF STRETCH  - SOLEUS 30 seconds, 3 repetitions - 3x per day  Start by standing in front of a wall or other sturdy object. Step forward with one foot and maintain your toes on both feet to be pointed straight forward. Keep the leg behind you with a bent knee during the stretch.   Lean forward towards the wall and support yourself with your arms as you allow your front knee to bend until a gentle stretch is felt along the back of your leg that is most behind you.   Move closer or further away from the wall to control the stretch of the back leg. Also you can adjust the bend of the front knee to control the stretch as well. Start by standing in front of a wall or other sturdy object. Step forward with one foot and maintain your toes on both feet to be pointed straight forward. Keep the leg behind you with a bent knee during the stretch.   Lean forward towards the wall and support yourself with your arms as you allow your front knee to bend until a gentle stretch is felt along the back of your leg that is most behind you.   Move closer or further away from the wall to control the stretch of the back leg. Also you can adjust the bend of the front knee to control the stretch as well.

## 2016-12-31 NOTE — Therapy (Signed)
[facility], [place], [address] Phone: [phone]   Fax:  [phone]  Physical Therapy Treatment  Patient Details  Name: Kelly Fry MRN: [id number] Date of Birth: 02/18/1985 Referring Provider: Marybelle Killings, MD   Encounter Date: 12/31/2016  PT End of Session - 12/31/16 1214    Visit Number  4    Number of Visits  12    Date for PT Re-Evaluation  12/18/16    Authorization Type  Medicaid [place] A    Authorization Time Period  12/31/10-01/26/17    Authorization - Visit Number  1    Authorization - Number of Visits  8    PT Start Time  0815    PT Stop Time  3212    PT Time Calculation (min)  40 min    Activity Tolerance  Patient limited by pain;Patient tolerated treatment well;No increased pain    Behavior During Therapy  WFL for tasks assessed/performed       Past Medical History:  Diagnosis Date  . Medical history non-contributory   . Miscarriage     Past Surgical History:  Procedure Laterality Date  . NO PAST SURGERIES    . WISDOM TOOTH EXTRACTION  04/12/2015    There were no vitals filed for this visit.  Subjective Assessment - 12/31/16 0819    Subjective  Patient reports she was on her feet for 11 hours yesterday but overall just a little tender today.     Pain Score  1     Pain Location  Ankle                      [facility] Adult PT Treatment/Exercise - 12/31/16 0001      Manual Therapy   Manual Therapy  Soft tissue mobilization;Edema management    Manual therapy comments  Audelia Hives complete separate from rest of tx    Edema Management  seated, retromassage for swelling    Soft tissue mobilization  anterior ankle into anterior tibialis mm belly and peroneals       Ankle Exercises: Standing   BAPS  5 reps;Level 2    Heel Raises  20 reps    Heel Raises Limitations  10 unilateral; Rt.     Toe Raise  20 reps    Tai Chi  airex: slow march x 20, step ups Rt. only x 10    Other Standing Ankle Exercises  Bosu lunge Rt only x 20      Ankle Exercises: Stretches   Gastroc Stretch  30 seconds;3 reps      Ankle Exercises: Seated   Towel Crunch  Other (comment) 20 times    Towel Inversion/Eversion  Other (comment) 15 times    Other Seated Ankle Exercises  toe extension wiht arch support 5x 3"    Other Seated Ankle Exercises  seated arch lift x 10             PT Education - 12/31/16 0847    Education provided  Yes    Education Details  Added calf stretch to home     Person(s) Educated  Patient    Methods  Demonstration;Handout;Explanation    Comprehension  Verbalized understanding       PT Short Term Goals - 11/27/16 1155      PT SHORT TERM GOAL #1   Title  Patient will be independent with her HEP to improve functional mobility and pain ratings.  Time  2    Period  Weeks    Status  New      PT SHORT TERM GOAL #2   Title  Patient will have decreased swelling of Rt. ankle when assessed by figure 8 method to equal Lt.     Time  2    Period  Weeks    Status  New      PT SHORT TERM GOAL #3   Title  Patient will report standing tolerance increased to >20 minutes to complete daily tasks at home without pain >4/10.     Time  3    Period  Weeks    Status  New      PT SHORT TERM GOAL #4   Title  Patient will have improved Rt. ankle ROM equal to Lt. without increased pain.     Time  3    Period  Weeks    Status  New      PT SHORT TERM GOAL #5   Title  Pt will be able to stand on one leg for 15 seconds to improve tolerance of weight-bearing with no increase in symptoms.     Time  3    Period  Weeks    Status  New        PT Long Term Goals - 11/27/16 1157      PT LONG TERM GOAL #1   Title  Patient will have improved MMT grade by at least 1 and complete 20 single leg heel raises to improve stability and functional mobility of Rt. ankle.     Time  5    Period  Weeks    Status  New      PT LONG TERM GOAL #2   Title  Patient will be  able to stand on one leg for 30 seconds without increased pain or LOB to improve safety and quality of ambulation at work and with stairs.     Time  5    Period  Weeks    Status  New      PT LONG TERM GOAL #3   Title  Patient will report increased tolerance to work load without pain at the end of her day >3/10 to improve quality of daily living.     Time  5    Period  Weeks    Status  New      PT LONG TERM GOAL #4   Title  Patient will tolerate standing and walking with daily tasks >45 minutes without increase in symptoms.     Time  5    Period  Weeks    Status  New            Plan - 12/31/16 1215    Clinical Impression Statement  Today's session focused on transition into standing therex mostly with intrinsic strengthening seated. Pt had slide increased swelling of ankle today due to work hours yesterday but completed increased challenge of exercises without pain. Significant challenge and "burning" of calf with heel raises and dynamic stability WB exercises.     PT Frequency  1x / week    PT Duration  3 weeks 2x/week x 4 weeks following initial 3    PT Treatment/Interventions  ADLs/Self Care Home Management;Dry needling;Patient/family education;Electrical Stimulation;Cryotherapy;Gait training;Stair training;Therapeutic exercise;Functional mobility training;Balance training;Neuromuscular re-education;Passive range of motion;Manual techniques;Taping;Energy conservation    PT Next Visit Plan  focuse on ankle strength, proprioception, ROM; Manual STM for pain as needed.  PT Home Exercise Plan  Eval: Ankle circles x 15, ABCs, Red tband ankle 4 way x 10 each       Patient will benefit from skilled therapeutic intervention in order to improve the following deficits and impairments:  Abnormal gait, Decreased endurance, Decreased activity tolerance, Decreased strength, Pain, Decreased balance, Difficulty walking, Decreased mobility, Decreased range of motion  Visit Diagnosis: Pain in  right ankle and joints of right foot  Decreased strength  Decreased mobility  Decreased proprioception of joint of foot, right  Localized swelling, mass and lump, right lower limb     Problem List Patient Active Problem List   Diagnosis Date Noted  . Vitamin D deficiency 07/04/2015  . GAD (generalized anxiety disorder) 06/30/2015  . Mood disorder ([facility]) 03/28/2015  . BMI 40.0-44.9, adult ([facility]) 12/21/2014  . Rh negative, antepartum 06/03/2013   Starr Lake PT, DPT 12:17 PM, 12/31/16 [place], [place], [address] Phone: [phone]   Fax:  [phone]  Name: SHAWNTELL DIXSON MRN: [id number] Date of Birth: Feb 26, 1985

## 2017-01-07 ENCOUNTER — Ambulatory Visit (HOSPITAL_COMMUNITY): Payer: Medicaid Other

## 2017-01-07 ENCOUNTER — Telehealth (HOSPITAL_COMMUNITY): Payer: Self-pay

## 2017-01-07 NOTE — Telephone Encounter (Signed)
Patient had to cancel do to sickness in her home her family all have a stomach bug

## 2017-01-08 ENCOUNTER — Other Ambulatory Visit: Payer: Self-pay | Admitting: Pediatrics

## 2017-01-08 DIAGNOSIS — Z30011 Encounter for initial prescription of contraceptive pills: Secondary | ICD-10-CM

## 2017-01-09 ENCOUNTER — Encounter (INDEPENDENT_AMBULATORY_CARE_PROVIDER_SITE_OTHER): Payer: Self-pay | Admitting: Orthopaedic Surgery

## 2017-01-09 ENCOUNTER — Ambulatory Visit (INDEPENDENT_AMBULATORY_CARE_PROVIDER_SITE_OTHER): Payer: Medicaid Other | Admitting: Orthopaedic Surgery

## 2017-01-09 VITALS — BP 154/79 | HR 77

## 2017-01-09 DIAGNOSIS — M25371 Other instability, right ankle: Secondary | ICD-10-CM | POA: Diagnosis not present

## 2017-01-09 NOTE — Addendum Note (Signed)
Addended by: Meyer Cory on: 01/09/2017 10:23 AM   Modules accepted: Orders

## 2017-01-09 NOTE — Progress Notes (Signed)
Office Visit Note   Patient: Kelly Fry           Date of Birth: 08-31-1985           MRN: [id number] Visit Date: 01/09/2017              Requested by: Timmothy Euler, MD [place], [place] [address] PCP: Timmothy Euler, MD   Assessment & Plan: Visit Diagnoses:  1. Right ankle instability     Plan: Had more than 1 a series of x-rays all negative.  She has persistent instability of her ankle is failed progression with physical therapy anti-inflammatories.  She is used Swede-O braces in the past without resolution of her symptoms.  She is continuing exercise program.  She has increased BMI and positive anterior drawer right ankle.  We will obtain the MRI scan right ankle office follow-up after scan for review.  Follow-Up Instructions: No Follow-up on file.   Orders:  No orders of the defined types were placed in this encounter.  No orders of the defined types were placed in this encounter.     Procedures: No procedures performed   Clinical Data: No additional findings.   Subjective: Chief Complaint  Patient presents with  . Right Ankle - Pain, Follow-up    HPI-year-old female returns she is here with her daughter persistent problems with the right ankle since she originally sprained it 3 years ago.  She has been through multiple therapy physical therapy visits 6 or 7 is still having problems.  Sometimes the pain is severe of the time she has minimal pain symptoms.  She states she sprained her ankle for 5 times in the last year each time she has pain swelling no ecchymosis ever has been noted than the original injury.  He can anti-inflammatories.  She does have increased BMI she denies any problems with the opposite left ankle.  No other joint symptoms.  Review of Systems negative for rheumatologic conditions.  14 point review of systems updated and is unchanged from last office visit on 10/24/16   Objective: Vital Signs: BP (!) 154/79   Pulse 77    Physical Exam  Constitutional: She is oriented to person, place, and time. She appears well-developed.  HENT:  Head: Normocephalic.  Right Ear: External ear normal.  Left Ear: External ear normal.  Eyes: Pupils are equal, round, and reactive to light.  Neck: No tracheal deviation present. No thyromegaly present.  Cardiovascular: Normal rate.  Pulmonary/Chest: Effort normal.  Abdominal: Soft.  Neurological: She is alert and oriented to person, place, and time.  Skin: Skin is warm and dry.  Psychiatric: She has a normal mood and affect. Her behavior is normal.    Ortho Exam her lateral ankles tenderness no ecchymosis peroneal tendons posterior tib are strong.  She can walk on her toes but has pain.  Trace effusion of her ankle anterolateral tenderness.  No tenderness over the posterior talofibular.  Achilles tendon plantar fascia is normal.  No plantar foot lesions normal pulses no pitting edema no cellulitis.  Negative straight leg raising 90 degrees.    Specialty Comments:  No specialty comments available.  Imaging: No results found.   PMFS History: Patient Active Problem List   Diagnosis Date Noted  . 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 Vitamin D deficiency 07/04/2015  . GAD (generalized anxiety disorder) 06/30/2015  . Mood disorder ([facility]) 03/28/2015  . BMI 40.0-44.9, adult ([facility]) 12/21/2014  . Rh negative, antepartum  06/03/2013   Past Medical History:  Diagnosis Date  . Medical history non-contributory   . Miscarriage     Family History  Problem Relation Age of Onset  . Kidney disease Father   . COPD Father   . Hypertension Father   . Kidney disease Sister   . Asthma Sister   . Early death Mother   . Stroke Mother   . Heart disease Mother   . Asthma Brother     Past Surgical History:  Procedure Laterality Date  . NO PAST SURGERIES    . WISDOM TOOTH EXTRACTION  04/12/2015   Social History   Occupational History  . Not on file  Tobacco Use  . Smoking  status: Never Smoker  . Smokeless tobacco: Never Used  Substance and Sexual Activity  . Alcohol use: No  . Drug use: No  . Sexual activity: Yes    Birth control/protection: IUD

## 2017-01-14 ENCOUNTER — Encounter (HOSPITAL_COMMUNITY): Payer: Self-pay

## 2017-01-14 ENCOUNTER — Ambulatory Visit (HOSPITAL_COMMUNITY): Payer: Medicaid Other | Attending: Orthopaedic Surgery

## 2017-01-14 DIAGNOSIS — R531 Weakness: Secondary | ICD-10-CM | POA: Diagnosis present

## 2017-01-14 DIAGNOSIS — M25571 Pain in right ankle and joints of right foot: Secondary | ICD-10-CM | POA: Diagnosis present

## 2017-01-14 DIAGNOSIS — M25871 Other specified joint disorders, right ankle and foot: Secondary | ICD-10-CM | POA: Insufficient documentation

## 2017-01-14 DIAGNOSIS — R2241 Localized swelling, mass and lump, right lower limb: Secondary | ICD-10-CM

## 2017-01-14 DIAGNOSIS — R2689 Other abnormalities of gait and mobility: Secondary | ICD-10-CM | POA: Insufficient documentation

## 2017-01-14 NOTE — Therapy (Signed)
[facility] [place], [place], [address] Phone: [phone]   Fax:  [phone]  Physical Therapy Treatment /Re-Assessment   Patient Details  Name: Kelly Fry MRN: [id number] Date of Birth: January 09, 1986 Referring Provider: Marybelle Killings, MD   Encounter Date: 01/14/2017  PT End of Session - 01/14/17 0944    Visit Number  5    Number of Visits  12    Date for PT Re-Evaluation  01/24/17    Authorization Type  Medicaid [place] [employer]    Authorization Time Period  12/31/10-01/26/17    Authorization - Visit Number  2    Authorization - Number of Visits  8    PT Start Time  0815    PT Stop Time  0900    PT Time Calculation (min)  45 min    Activity Tolerance  Patient limited by pain;Patient tolerated treatment well;No increased pain    Behavior During Therapy  WFL for tasks assessed/performed       Past Medical History:  Diagnosis Date  . Medical history non-contributory   . Miscarriage     Past Surgical History:  Procedure Laterality Date  . NO PAST SURGERIES    . WISDOM TOOTH EXTRACTION  04/12/2015    There were no vitals filed for this visit.  Subjective Assessment - 01/14/17 0812    Subjective  Pt notes that since Thanksgiving she has had quite a bit of pain. She notes being on her feet aboput 17 hours and just hasn't been able to get the pain down. She notes more swelling as well, even though she has been icing and elevating. She notes a "crunch" on the inner side of her heel with walking.  She reports going to an MRI this Thursday and has another follow up with Dr. Lorin Mercy. She notes that she may be looking into surgery from last follow up visit.     Currently in Pain?  Yes    Pain Score  6     Pain Location  Ankle    Pain Orientation  Right         [facility] PT Assessment - 01/14/17 0001      Observation/Other Assessments-Edema    Edema  Figure 8 19.5 inch Rt. ankle      Single Leg Stance   Comments  15  seconds on Rt.      AROM   Right Ankle Dorsiflexion  10    Right Ankle Plantar Flexion  55    Right Ankle Inversion  22    Right Ankle Eversion  28      Strength   Right Ankle Dorsiflexion  5/5    Right Ankle Inversion  4+/5    Right Ankle Eversion  4+/5                  [facility] Adult PT Treatment/Exercise - 01/14/17 0001      Manual Therapy   Manual Therapy  Soft tissue mobilization;Edema management    Manual therapy comments  Audelia Hives complete separate from rest of tx      Ankle Exercises: Standing   Heel Raises  10 reps    Heel Raises Limitations  5 unilater      Ankle Exercises: Seated   Ankle Circles/Pumps  10 reps CW and CCW    BAPS  Sitting;Level 3;10 reps In anterior posterior and left/right directions    Other Seated Ankle Exercises  toe extension wiht arch support  10x 3"      Ankle Exercises: Stretches   Gastroc Stretch  30 seconds;3 reps               PT Short Term Goals - 01/14/17 0844      PT SHORT TERM GOAL #1   Title  Patient will be independent with her HEP to improve functional mobility and pain ratings.     Baseline  12/4: reports of compliance with improvement in pain    Time  2    Period  Weeks    Status  On-going      PT SHORT TERM GOAL #2   Title  Patient will have decreased swelling of Rt. ankle when assessed by figure 8 method to equal Lt.     Baseline  12/4: 19.5 inches this session (decrease 2 inches approximately)    Time  2    Period  Weeks    Status  Achieved      PT SHORT TERM GOAL #3   Title  Patient will report standing tolerance increased to >20 minutes to complete daily tasks at home without pain >4/10.     Baseline  12/4: 6.5-7/10 pain at 20 minutes    Time  3    Period  Weeks    Status  Not Met      PT SHORT TERM GOAL #4   Title  Patient will have improved Rt. ankle ROM equal to Lt. without increased pain.     Baseline  12/4: improved all four directions 2+ degrees without increased pain > toleration.      Time  3    Period  Weeks    Status  Achieved      PT SHORT TERM GOAL #5   Title  Pt will be able to stand on one leg for 15 seconds to improve tolerance of weight-bearing with no increase in symptoms.     Baseline  12/4: 15 seconds about 5/10 pain "bearable"; improved stability but continues visual instability .    Time  3    Period  Weeks    Status  Partially Met        PT Long Term Goals - 01/14/17 0949      PT LONG TERM GOAL #1   Title  Patient will have improved MMT grade by at least 1 and complete 20 single leg heel raises to improve stability and functional mobility of Rt. ankle.     Baseline  12/4: able to complete 5 this session with pain     Time  5    Period  Weeks    Status  New      PT LONG TERM GOAL #2   Title  Patient will be able to stand on one leg for 30 seconds without increased pain or LOB to improve safety and quality of ambulation at work and with stairs.     Baseline  12/4: 15 seconds this session     Time  5    Period  Weeks    Status  New      PT LONG TERM GOAL #3   Title  Patient will report increased tolerance to work load without pain at the end of her day >3/10 to improve quality of daily living.     Baseline  12/4: pt quite job last week, unable to assess further with Work - ADLs approximately 20 minutes 6+/10 pain     Time  5    Period  Weeks    Status  Unable to assess      PT LONG TERM GOAL #4   Title  Patient will tolerate standing and walking with daily tasks >45 minutes without increase in symptoms.     Baseline  12/4: 20 minutes max     Time  5    Period  Weeks    Status  On-going            Plan - 01/14/17 0945    Clinical Impression Statement  Reassessment completed this visit. This session, patient stated that her pain level has increased since Thanksgiving when she was on her feet for many hours. Therapist began session by focusing on soft tissue mobilization to the right foot and ankle region primarily in plantar surface and  medial surface in order to promote relaxation and decrease pain. Patient states that she feels a "crunching" feeling occasionally in right foot. With assessment patient demonstrated increased range of motion in all planes by at least 2 degrees without an increase in symptoms. Patient has made good improvements toward reaching goals increasing ankle range of motion and strength. Significant restrictions noted around posterior Tibilalis tendon distally, around medial malleoli and intio distal mm belly. Held standing therex secondary to pain increase.     PT Frequency  1x / week    PT Duration  3 weeks 2x/week x 4 weeks following initial 3    PT Treatment/Interventions  ADLs/Self Care Home Management;Dry needling;Patient/family education;Electrical Stimulation;Cryotherapy;Gait training;Stair training;Therapeutic exercise;Functional mobility training;Balance training;Neuromuscular re-education;Passive range of motion;Manual techniques;Taping;Energy conservation    PT Next Visit Plan  focuse on ankle strength, proprioception, ROM; Manual STM for pain as needed. Follow up regardnig MRI. Resume standing exercise as tolerated.     PT Home Exercise Plan  Eval: Ankle circles x 15, ABCs, Red tband ankle 4 way x 10 each       Patient will benefit from skilled therapeutic intervention in order to improve the following deficits and impairments:  Abnormal gait, Decreased endurance, Decreased activity tolerance, Decreased strength, Pain, Decreased balance, Difficulty walking, Decreased mobility, Decreased range of motion  Visit Diagnosis: Pain in right ankle and joints of right foot  Decreased strength  Decreased mobility  Decreased proprioception of joint of foot, right  Localized swelling, mass and lump, right lower limb     Problem List Patient Active Problem List   Diagnosis Date Noted  . 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 Vitamin D deficiency 07/04/2015  . GAD (generalized anxiety disorder)  06/30/2015  . Mood disorder ([place]) 03/28/2015  . BMI 40.0-44.9, adult ([place]) 12/21/2014  . Rh negative, antepartum 06/03/2013    [place]arr [place] PT, DPT 9:52 AM, 01/14/17 [place] PT, DPT   [facility] [address] [place], [place], [address] Phone: [phone]   Fax:  [phone]  Name: Kelly Fry MRN: [id number] Date of Birth: 11/22/1985

## 2017-01-16 ENCOUNTER — Encounter (INDEPENDENT_AMBULATORY_CARE_PROVIDER_SITE_OTHER): Payer: Self-pay | Admitting: Orthopaedic Surgery

## 2017-01-16 ENCOUNTER — Ambulatory Visit (INDEPENDENT_AMBULATORY_CARE_PROVIDER_SITE_OTHER): Payer: Medicaid Other | Admitting: Orthopaedic Surgery

## 2017-01-16 VITALS — BP 109/71 | HR 83

## 2017-01-16 DIAGNOSIS — M25371 Other instability, right ankle: Secondary | ICD-10-CM | POA: Diagnosis not present

## 2017-01-16 NOTE — Progress Notes (Signed)
Office Visit Note   Patient: Kelly Fry           Date of Birth: 02-12-1986           MRN: [id number] Visit Date: 01/16/2017              Requested by: Timmothy Euler, MD [place], [place] [address] PCP: Timmothy Euler, MD   Assessment & Plan: Visit Diagnoses: No diagnosis found.  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  She understands she needs some tunnels placed in the bone in a weave pattern placed to tighten up the ankle joint.  She understands she would be immobilized in a splint initially with nonweightbearing and then boot   Of a total of 6 weeks and then after that she would be in some therapy.  We discussed problems with simple imbrication of the anterolateral capsule patients that are obese and also those that have torn the calcaneal fibular ligament high rate of reinjury continued instability.  Discussed plans for a split peroneal longus repair through bone tunnels.  Risk of bone fracture through the tunnel, ankle stiffness importance of following postoperative protocol and instructions.  Questions were elicited and answered she understands and requests we proceed.  I would be overnight stay discussed with her possible preoperative block to help with her postoperative pain.  Follow-Up Instructions: No Follow-up on file.   Orders:  No orders of the defined types were placed in this encounter.  No orders of the defined types were placed in this encounter.     Procedures: No procedures performed   Clinical Data: No additional findings.   Subjective: Chief Complaint  Patient presents with  . 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  He has been through several series wearing the [place] ankle brace therapy taken anti-inflammatories after acute sprains.  Original injury was 3 years ago and since that time she sprained her ankle at least 5 times per year with recurrent ecchymosis difficulty walking use of crutches.  Problems with the opposite ankle.  MRI scan of her ankle 01/16/2017 showed T2 signal increase superior medial border of the spring ligament at the navicular attachment consistent with sprain.  Attenuated anterior talofibular ligament and calcaneofibular ligament consistent with chronic prior injury.  Mild dorsal navicular cuneiform osteoarthritis.  Mild bimalleolar and ankle anterior soft tissue swelling.  Talus was normal.  Patient's been treated with anti-inflammatory she is also had prednisone Dosepaks without relief.  She has had right  ankle sprains on level ground.  Review of Systems 14 point review of systems positive for history of gastric reflux, history of bronchitis.  Vitamin D deficiency.  Morbid obesity with BMI 48.  History of ongoing low back pain, chronic without radiculopathy.   Objective: Vital Signs: BP 109/71   Pulse 83   Physical Exam  Constitutional: She is oriented to person, place, and time. She appears well-developed.  HENT:  Head: Normocephalic.  Right Ear: External ear normal.  Left Ear: External ear normal.  Eyes: Pupils are equal, round, and reactive to light.  Neck: No tracheal deviation present. No thyromegaly present.  Cardiovascular:  Normal rate.  Pulmonary/Chest: Effort normal.  Abdominal: Soft.  Neurological: She is alert and oriented to person, place, and time.  Skin: Skin is warm and dry.  Psychiatric: She has a normal mood and affect. Her behavior is normal.    Ortho Exam patient has positive anterior drawer with lateral talar dome on coverage.  Talofibular and calcaneofibular  ligament.  This over the posterior talofibular ligament.  Syndesmosis is nontender.  Normal subtalar motion.  Specialty Comments:  No specialty comments available.  Imaging: MRI scan right ankle done early this morning is available for review.  This shows changes consistent with old spring ligament injury near the navicular attachment consistent with sprain.  She has tear and attenuation of the anterior talofibular ligament and calcaneofibular ligament with intact posterior talofibular ligament.     PMFS History: Patient Active Problem List   Diagnosis Date Noted  . 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 Vitamin D deficiency 07/04/2015  . GAD (generalized anxiety disorder) 06/30/2015  . Mood disorder ([place]) 03/28/2015  . BMI 40.0-44.9, adult ([place]) 12/21/2014  . Rh negative, antepartum 06/03/2013   Past Medical History:  Diagnosis Date  . Medical history non-contributory   . Miscarriage     Family History  Problem Relation Age of Onset  . Kidney disease Father   . COPD Father   . Hypertension Father   . Kidney disease Sister   . Asthma Sister   . Early death Mother   . Stroke Mother   . Heart disease Mother   . Asthma Brother     Past Surgical History:  Procedure Laterality Date  . NO PAST SURGERIES    . WISDOM TOOTH EXTRACTION  04/12/2015   Social History   Occupational History  . Not on file  Tobacco Use  . Smoking status: Never Smoker  . Smokeless tobacco: Never Used  Substance and Sexual Activity  . Alcohol use: No  . Drug use: No  . Sexual activity: Yes    Birth control/protection: IUD

## 2017-01-17 ENCOUNTER — Telehealth (INDEPENDENT_AMBULATORY_CARE_PROVIDER_SITE_OTHER): Payer: Self-pay | Admitting: Orthopaedic Surgery

## 2017-01-17 NOTE — Telephone Encounter (Signed)
Pt called and she still has Physical therapy sessions.Pt would like to know if she needs to continue or wait for surgery.

## 2017-01-17 NOTE — Telephone Encounter (Signed)
Please advise 

## 2017-01-17 NOTE — Telephone Encounter (Signed)
[place] for continued therapy should help with post op activities and also getting surgery approved.

## 2017-01-21 NOTE — Telephone Encounter (Signed)
IC patient and advised.  

## 2017-01-22 ENCOUNTER — Encounter (INDEPENDENT_AMBULATORY_CARE_PROVIDER_SITE_OTHER): Payer: Self-pay | Admitting: Orthopaedic Surgery

## 2017-02-18 NOTE — Pre-Procedure Instructions (Signed)
Kelly Fry  02/18/2017      [facility] [address] [place] [address] [place] [address] Phone: [phone] Fax: [phone]    Your procedure is scheduled on February 21, 2017.  Report to [facility] Admitting at 700 AM.  Call this number if you have problems the morning of surgery:  [phone]   Remember:  Do not eat food or drink liquids after midnight.  Take these medicines the morning of surgery with A SIP OF WATER albuterol inhaler, famotidine (pepcid), pantoprazole (protonix), tri-lo-estarylla.  7 days prior to surgery STOP taking any Aspirin (unless otherwise instructed by your surgeon), Aleve, Naproxen, Ibuprofen, Motrin, Advil, Goody's, BC's, all herbal medications, fish oil, and all vitamins  Continue all other medications as instructed by your physician except follow the above medication instructions before surgery   Do not wear jewelry, make-up or nail polish.  Do not wear lotions, powders, or perfumes, or deodorant.  Do not shave 48 hours prior to surgery.  Men may shave face and neck.  Do not bring valuables to the hospital.  [facility] is not responsible for any belongings or valuables.  Contacts, dentures or bridgework may not be worn into surgery.  Leave your suitcase in the car.  After surgery it may be brought to your room.  For patients admitted to the hospital, discharge time will be determined by your treatment team.  Patients discharged the day of surgery will not be allowed to drive home.   Special instructions:  - Preparing For Surgery  Before surgery, you can play an important role. Because skin is not sterile, your skin needs to be as free of germs as possible. You can reduce the number of germs on your skin by washing with CHG (chlorahexidine gluconate) Soap before surgery.  CHG is an antiseptic cleaner which kills germs and bonds with the skin to continue killing germs even after  washing.  Please do not use if you have an allergy to CHG or antibacterial soaps. If your skin becomes reddened/irritated stop using the CHG.  Do not shave (including legs and underarms) for at least 48 hours prior to first CHG shower. It is OK to shave your face.  Please follow these instructions carefully.   1. Shower the NIGHT BEFORE SURGERY and the MORNING OF SURGERY with CHG.   2. If you chose to wash your hair, wash your hair first as usual with your normal shampoo.  3. After you shampoo, rinse your hair and body thoroughly to remove the shampoo.  4. Use CHG as you would any other liquid soap. You can apply CHG directly to the skin and wash gently with a scrungie or a clean washcloth.   5. Apply the CHG Soap to your body ONLY FROM THE NECK DOWN.  Do not use on open wounds or open sores. Avoid contact with your eyes, ears, mouth and genitals (private parts). Wash Face and genitals (private parts)  with your normal soap.  6. Wash thoroughly, paying special attention to the area where your surgery will be performed.  7. Thoroughly rinse your body with warm water from the neck down.  8. DO NOT shower/wash with your normal soap after using and rinsing off the CHG Soap.  9. Pat yourself dry with a CLEAN TOWEL.  10. Wear CLEAN PAJAMAS to bed the night before surgery, wear comfortable clothes the morning of surgery  11. Place CLEAN SHEETS  on your bed the night of your first shower and DO NOT SLEEP WITH PETS.  Day of Surgery: Do not apply any deodorants/lotions. Please wear clean clothes to the hospital/surgery center.    Please read over the following fact sheets that you were given. Pain Booklet, Coughing and Deep Breathing and Surgical Site Infection Prevention

## 2017-02-19 ENCOUNTER — Encounter (HOSPITAL_COMMUNITY): Payer: Self-pay

## 2017-02-19 ENCOUNTER — Encounter (HOSPITAL_COMMUNITY)
Admission: RE | Admit: 2017-02-19 | Discharge: 2017-02-19 | Disposition: A | Discharge status: Home / Self Care | Payer: Medicaid Other | Source: Ambulatory Visit | Attending: Orthopaedic Surgery | Admitting: Orthopaedic Surgery

## 2017-02-19 ENCOUNTER — Other Ambulatory Visit: Payer: Self-pay

## 2017-02-19 DIAGNOSIS — Z01810 Encounter for preprocedural cardiovascular examination: Secondary | ICD-10-CM | POA: Insufficient documentation

## 2017-02-19 DIAGNOSIS — Z01812 Encounter for preprocedural laboratory examination: Secondary | ICD-10-CM | POA: Diagnosis present

## 2017-02-19 HISTORY — DX: Anxiety disorder, unspecified: F41.9

## 2017-02-19 HISTORY — DX: Nausea with vomiting, unspecified: R11.2

## 2017-02-19 HISTORY — DX: Family history of other specified conditions: Z84.89

## 2017-02-19 HISTORY — DX: Other complications of anesthesia, initial encounter: T88.59XA

## 2017-02-19 HISTORY — DX: Gastro-esophageal reflux disease without esophagitis: K21.9

## 2017-02-19 HISTORY — DX: Adverse effect of unspecified anesthetic, initial encounter: T41.45XA

## 2017-02-19 HISTORY — DX: Bipolar disorder, unspecified: F31.9

## 2017-02-19 HISTORY — DX: Other instability, right ankle: M25.371

## 2017-02-19 HISTORY — DX: Other specified postprocedural states: Z98.890

## 2017-02-19 LAB — COMPREHENSIVE METABOLIC PANEL
ALBUMIN: 3.7 g/dL (ref 3.5–5.0)
ALK PHOS: 73 U/L (ref 38–126)
ALT: 20 U/L (ref 14–54)
AST: 22 U/L (ref 15–41)
Anion gap: 9 (ref 5–15)
BILIRUBIN TOTAL: 0.6 mg/dL (ref 0.3–1.2)
BUN: 9 mg/dL (ref 6–20)
CALCIUM: 9.1 mg/dL (ref 8.9–10.3)
CO2: 24 mmol/L (ref 22–32)
CREATININE: 0.92 mg/dL (ref 0.44–1.00)
Chloride: 103 mmol/L (ref 101–111)
GFR calc Af Amer: >60 mL/min (ref >60)
GFR calc non Af Amer: >60 mL/min (ref >60)
GLUCOSE: 91 mg/dL (ref 65–99)
Potassium: 4 mmol/L (ref 3.5–5.1)
SODIUM: 136 mmol/L (ref 135–145)
TOTAL PROTEIN: 7.3 g/dL (ref 6.5–8.1)

## 2017-02-19 LAB — CBC
HEMATOCRIT: 43.2 % (ref 36.0–46.0)
HEMOGLOBIN: 14.1 g/dL (ref 12.0–15.0)
MCH: 28.5 pg (ref 26.0–34.0)
MCHC: 32.6 g/dL (ref 30.0–36.0)
MCV: 87.3 fL (ref 78.0–100.0)
Platelets: 247 10*3/uL (ref 150–400)
RBC: 4.95 MIL/uL (ref 3.87–5.11)
RDW: 12.9 % (ref 11.5–15.5)
WBC: 7.6 10*3/uL (ref 4.0–10.5)

## 2017-02-19 LAB — HCG, SERUM, QUALITATIVE: Preg, Serum: NEGATIVE

## 2017-02-19 LAB — SURGICAL PCR SCREEN
MRSA, PCR: NEGATIVE
STAPHYLOCOCCUS AUREUS: NEGATIVE

## 2017-02-19 NOTE — Progress Notes (Signed)
PCP - Dr. Wendi Snipes- [facility]  Cardiologist - Denies  Chest x-ray - Denies  EKG - 02/20/16  Stress Test - Denies  ECHO - Denies  Cardiac Cath - Denies  Sleep Study - Denies CPAP - None  LABS- 02/20/16: CBC,CMP, HCG   Anesthesia- No  Pt denies having chest pain, sob, or fever at this time. All instructions explained to the pt, with a verbal understanding of the material. Pt agrees to go over the instructions while at home for a better understanding. The opportunity to ask questions was provided.

## 2017-02-20 MED ORDER — DEXTROSE 5 % IV SOLN
3.0000 g | INTRAVENOUS | Status: AC
  Administered 2017-02-21: 3 g via INTRAVENOUS
  Filled 2017-02-20: qty 3

## 2017-02-21 ENCOUNTER — Encounter (HOSPITAL_COMMUNITY)
Admission: RE | Disposition: A | Discharge status: Home / Self Care | Payer: Self-pay | Source: Ambulatory Visit | Attending: Orthopaedic Surgery

## 2017-02-21 ENCOUNTER — Inpatient Hospital Stay (HOSPITAL_COMMUNITY)
Admission: RE | Admit: 2017-02-21 | Discharge: 2017-02-22 | DRG: 502 | Disposition: A | Discharge status: Home / Self Care | Payer: Medicaid Other | Source: Ambulatory Visit | Attending: Orthopaedic Surgery | Admitting: Orthopaedic Surgery

## 2017-02-21 ENCOUNTER — Encounter (HOSPITAL_COMMUNITY): Payer: Self-pay | Admitting: *Deleted

## 2017-02-21 ENCOUNTER — Ambulatory Visit (HOSPITAL_COMMUNITY): Payer: Medicaid Other | Admitting: Certified Registered"

## 2017-02-21 DIAGNOSIS — M25371 Other instability, right ankle: Secondary | ICD-10-CM | POA: Diagnosis present

## 2017-02-21 DIAGNOSIS — M25571 Pain in right ankle and joints of right foot: Secondary | ICD-10-CM | POA: Diagnosis present

## 2017-02-21 DIAGNOSIS — F319 Bipolar disorder, unspecified: Secondary | ICD-10-CM | POA: Diagnosis present

## 2017-02-21 DIAGNOSIS — Z825 Family history of asthma and other chronic lower respiratory diseases: Secondary | ICD-10-CM

## 2017-02-21 DIAGNOSIS — Z888 Allergy status to other drugs, medicaments and biological substances status: Secondary | ICD-10-CM | POA: Diagnosis not present

## 2017-02-21 DIAGNOSIS — F419 Anxiety disorder, unspecified: Secondary | ICD-10-CM | POA: Diagnosis present

## 2017-02-21 DIAGNOSIS — K219 Gastro-esophageal reflux disease without esophagitis: Secondary | ICD-10-CM | POA: Diagnosis present

## 2017-02-21 HISTORY — PX: ANKLE RECONSTRUCTION: SHX1151

## 2017-02-21 SURGERY — RECONSTRUCTION, ANKLE
Anesthesia: General | Laterality: Right

## 2017-02-21 MED ORDER — FENTANYL CITRATE (PF) 100 MCG/2ML IJ SOLN
INTRAMUSCULAR | Status: AC
  Filled 2017-02-21: qty 2

## 2017-02-21 MED ORDER — FENTANYL CITRATE (PF) 100 MCG/2ML IJ SOLN
50.0000 ug | Freq: Once | INTRAMUSCULAR | Status: AC
  Administered 2017-02-21: 50 ug via INTRAVENOUS

## 2017-02-21 MED ORDER — HYDROMORPHONE HCL 1 MG/ML IJ SOLN: 0.5000 mg | INTRAMUSCULAR | Status: DC | PRN

## 2017-02-21 MED ORDER — MIDAZOLAM HCL 5 MG/5ML IJ SOLN
INTRAMUSCULAR | Status: DC | PRN
  Administered 2017-02-21: 2 mg via INTRAVENOUS

## 2017-02-21 MED ORDER — METOCLOPRAMIDE HCL 5 MG PO TABS: 5.0000 mg | ORAL_TABLET | Freq: Three times a day (TID) | ORAL | Status: DC | PRN

## 2017-02-21 MED ORDER — DEXAMETHASONE SODIUM PHOSPHATE 10 MG/ML IJ SOLN
INTRAMUSCULAR | Status: AC
  Filled 2017-02-21: qty 1

## 2017-02-21 MED ORDER — 0.9 % SODIUM CHLORIDE (POUR BTL) OPTIME
TOPICAL | Status: DC | PRN
  Administered 2017-02-21: 1000 mL

## 2017-02-21 MED ORDER — ACETAMINOPHEN 650 MG RE SUPP: 650.0000 mg | RECTAL | Status: DC | PRN

## 2017-02-21 MED ORDER — ACETAMINOPHEN 325 MG PO TABS: 650.0000 mg | ORAL_TABLET | ORAL | Status: DC | PRN

## 2017-02-21 MED ORDER — FENTANYL CITRATE (PF) 250 MCG/5ML IJ SOLN
INTRAMUSCULAR | Status: DC | PRN
  Administered 2017-02-21: 50 ug via INTRAVENOUS
  Administered 2017-02-21: 100 ug via INTRAVENOUS
  Administered 2017-02-21 (×2): 50 ug via INTRAVENOUS

## 2017-02-21 MED ORDER — PANTOPRAZOLE SODIUM 40 MG PO TBEC: 40.0000 mg | DELAYED_RELEASE_TABLET | Freq: Every day | ORAL | Status: DC | PRN

## 2017-02-21 MED ORDER — ASPIRIN 325 MG PO TABS
325.0000 mg | ORAL_TABLET | Freq: Every day | ORAL | Status: DC
  Administered 2017-02-21 – 2017-02-22 (×2): 325 mg via ORAL
  Filled 2017-02-21 (×2): qty 1

## 2017-02-21 MED ORDER — MIDAZOLAM HCL 2 MG/2ML IJ SOLN
INTRAMUSCULAR | Status: AC
  Filled 2017-02-21: qty 2

## 2017-02-21 MED ORDER — NORGESTIM-ETH ESTRAD TRIPHASIC 0.18/0.215/0.25 MG-25 MCG PO TABS: 1.0000 | ORAL_TABLET | Freq: Every day | ORAL | Status: DC

## 2017-02-21 MED ORDER — DEXAMETHASONE SODIUM PHOSPHATE 10 MG/ML IJ SOLN
INTRAMUSCULAR | Status: DC | PRN
  Administered 2017-02-21: 10 mg via INTRAVENOUS

## 2017-02-21 MED ORDER — METOCLOPRAMIDE HCL 5 MG/ML IJ SOLN: 5.0000 mg | Freq: Three times a day (TID) | INTRAMUSCULAR | Status: DC | PRN

## 2017-02-21 MED ORDER — METHOCARBAMOL 500 MG PO TABS
500.0000 mg | ORAL_TABLET | Freq: Four times a day (QID) | ORAL | Status: DC | PRN
  Administered 2017-02-22: 500 mg via ORAL
  Filled 2017-02-21: qty 1

## 2017-02-21 MED ORDER — ONDANSETRON HCL 4 MG/2ML IJ SOLN
INTRAMUSCULAR | Status: AC
  Filled 2017-02-21: qty 2

## 2017-02-21 MED ORDER — MIDAZOLAM HCL 2 MG/2ML IJ SOLN
1.0000 mg | Freq: Once | INTRAMUSCULAR | Status: AC
  Administered 2017-02-21: 1 mg via INTRAVENOUS

## 2017-02-21 MED ORDER — PHENYLEPHRINE 40 MCG/ML (10ML) SYRINGE FOR IV PUSH (FOR BLOOD PRESSURE SUPPORT)
PREFILLED_SYRINGE | INTRAVENOUS | Status: AC
  Filled 2017-02-21: qty 10

## 2017-02-21 MED ORDER — LIDOCAINE 2% (20 MG/ML) 5 ML SYRINGE
INTRAMUSCULAR | Status: DC | PRN
  Administered 2017-02-21: 40 mg via INTRAVENOUS

## 2017-02-21 MED ORDER — PROPOFOL 10 MG/ML IV BOLUS
INTRAVENOUS | Status: DC | PRN
  Administered 2017-02-21: 200 mg via INTRAVENOUS

## 2017-02-21 MED ORDER — ONDANSETRON HCL 4 MG/2ML IJ SOLN: 4.0000 mg | Freq: Four times a day (QID) | INTRAMUSCULAR | Status: DC | PRN

## 2017-02-21 MED ORDER — PROPOFOL 10 MG/ML IV BOLUS
INTRAVENOUS | Status: AC
  Filled 2017-02-21: qty 20

## 2017-02-21 MED ORDER — METHOCARBAMOL 1000 MG/10ML IJ SOLN
500.0000 mg | Freq: Four times a day (QID) | INTRAVENOUS | Status: DC | PRN
  Filled 2017-02-21: qty 5

## 2017-02-21 MED ORDER — FENTANYL CITRATE (PF) 250 MCG/5ML IJ SOLN
INTRAMUSCULAR | Status: AC
  Filled 2017-02-21: qty 5

## 2017-02-21 MED ORDER — PHENYLEPHRINE 40 MCG/ML (10ML) SYRINGE FOR IV PUSH (FOR BLOOD PRESSURE SUPPORT)
PREFILLED_SYRINGE | INTRAVENOUS | Status: DC | PRN
  Administered 2017-02-21: 80 ug via INTRAVENOUS
  Administered 2017-02-21: 120 ug via INTRAVENOUS
  Administered 2017-02-21: 80 ug via INTRAVENOUS
  Administered 2017-02-21: 120 ug via INTRAVENOUS

## 2017-02-21 MED ORDER — LACTATED RINGERS IV SOLN
INTRAVENOUS | Status: DC
  Administered 2017-02-21 (×2): via INTRAVENOUS

## 2017-02-21 MED ORDER — SODIUM CHLORIDE 0.9 % IV SOLN: INTRAVENOUS | Status: DC

## 2017-02-21 MED ORDER — CHLORHEXIDINE GLUCONATE 4 % EX LIQD: 60.0000 mL | Freq: Once | CUTANEOUS | Status: DC

## 2017-02-21 MED ORDER — BUPIVACAINE HCL (PF) 0.25 % IJ SOLN
INTRAMUSCULAR | Status: DC | PRN
  Administered 2017-02-21: 10 mL

## 2017-02-21 MED ORDER — CEFAZOLIN SODIUM-DEXTROSE 1-4 GM/50ML-% IV SOLN
1.0000 g | Freq: Three times a day (TID) | INTRAVENOUS | Status: AC
  Administered 2017-02-21 (×2): 1 g via INTRAVENOUS
  Filled 2017-02-21 (×2): qty 50

## 2017-02-21 MED ORDER — ALBUTEROL SULFATE (2.5 MG/3ML) 0.083% IN NEBU: 2.5000 mg | INHALATION_SOLUTION | Freq: Four times a day (QID) | RESPIRATORY_TRACT | Status: DC | PRN

## 2017-02-21 MED ORDER — ONDANSETRON HCL 4 MG/2ML IJ SOLN
INTRAMUSCULAR | Status: DC | PRN
  Administered 2017-02-21: 4 mg via INTRAVENOUS

## 2017-02-21 MED ORDER — SCOPOLAMINE 1 MG/3DAYS TD PT72
MEDICATED_PATCH | TRANSDERMAL | Status: DC | PRN
  Administered 2017-02-21: 1 via TRANSDERMAL

## 2017-02-21 MED ORDER — OXYCODONE HCL 5 MG PO TABS
5.0000 mg | ORAL_TABLET | ORAL | Status: DC | PRN
  Administered 2017-02-22: 5 mg via ORAL
  Filled 2017-02-21: qty 1

## 2017-02-21 MED ORDER — SCOPOLAMINE 1 MG/3DAYS TD PT72
MEDICATED_PATCH | TRANSDERMAL | Status: AC
  Filled 2017-02-21: qty 1

## 2017-02-21 MED ORDER — ONDANSETRON HCL 4 MG PO TABS: 4.0000 mg | ORAL_TABLET | Freq: Four times a day (QID) | ORAL | Status: DC | PRN

## 2017-02-21 MED ORDER — PHENYLEPHRINE HCL 10 MG/ML IJ SOLN
INTRAVENOUS | Status: DC | PRN
  Administered 2017-02-21: 20 ug/min via INTRAVENOUS

## 2017-02-21 SURGICAL SUPPLY — 61 items
ANCH SUT SWLK 19.1X4.75 (Anchor) ×2 IMPLANT
ANCHOR SUT BIO SW 4.75X19.1 (Anchor) ×4 IMPLANT
BANDAGE ACE 4X5 VEL STRL LF (GAUZE/BANDAGES/DRESSINGS) ×2 IMPLANT
BANDAGE ELASTIC 6 VELCRO ST LF (GAUZE/BANDAGES/DRESSINGS) ×2 IMPLANT
BANDAGE ESMARK 6X9 LF (GAUZE/BANDAGES/DRESSINGS) IMPLANT
BLADE SURG 10 STRL SS (BLADE) ×3 IMPLANT
BNDG CMPR 9X4 STRL LF SNTH (GAUZE/BANDAGES/DRESSINGS)
BNDG CMPR 9X6 STRL LF SNTH (GAUZE/BANDAGES/DRESSINGS) ×1
BNDG ESMARK 4X9 LF (GAUZE/BANDAGES/DRESSINGS) IMPLANT
BNDG ESMARK 6X9 LF (GAUZE/BANDAGES/DRESSINGS) ×3
CLOSURE WOUND 1/2 X4 (GAUZE/BANDAGES/DRESSINGS) ×1
COVER MAYO STAND STRL (DRAPES) ×3 IMPLANT
COVER SURGICAL LIGHT HANDLE (MISCELLANEOUS) ×2 IMPLANT
CUFF TOURNIQUET SINGLE 34IN LL (TOURNIQUET CUFF) IMPLANT
CUFF TOURNIQUET SINGLE 44IN (TOURNIQUET CUFF) ×2 IMPLANT
DRAPE INCISE IOBAN 66X45 STRL (DRAPES) ×3 IMPLANT
DRAPE U-SHAPE 47X51 STRL (DRAPES) ×3 IMPLANT
DRSG ADAPTIC 3X8 NADH LF (GAUZE/BANDAGES/DRESSINGS) ×1 IMPLANT
DRSG PAD ABDOMINAL 8X10 ST (GAUZE/BANDAGES/DRESSINGS) ×6 IMPLANT
DURAPREP 26ML APPLICATOR (WOUND CARE) ×3 IMPLANT
ELECT REM PT RETURN 9FT ADLT (ELECTROSURGICAL) ×3
ELECTRODE REM PT RTRN 9FT ADLT (ELECTROSURGICAL) ×1 IMPLANT
GAUZE SPONGE 4X4 12PLY STRL (GAUZE/BANDAGES/DRESSINGS) ×4 IMPLANT
GAUZE XEROFORM 1X8 LF (GAUZE/BANDAGES/DRESSINGS) ×2 IMPLANT
GLOVE BIOGEL PI IND STRL 8 (GLOVE) ×2 IMPLANT
GLOVE BIOGEL PI INDICATOR 8 (GLOVE) ×4
GLOVE ORTHO TXT STRL SZ7.5 (GLOVE) ×6 IMPLANT
GOWN STRL REUS W/ TWL LRG LVL3 (GOWN DISPOSABLE) ×1 IMPLANT
GOWN STRL REUS W/ TWL XL LVL3 (GOWN DISPOSABLE) ×1 IMPLANT
GOWN STRL REUS W/TWL 2XL LVL3 (GOWN DISPOSABLE) ×3 IMPLANT
GOWN STRL REUS W/TWL LRG LVL3 (GOWN DISPOSABLE) ×3
GOWN STRL REUS W/TWL XL LVL3 (GOWN DISPOSABLE) ×3
KIT BIO-TENODESIS 3X8 DISP (MISCELLANEOUS) ×6
KIT INSRT BABSR STRL DISP BTN (MISCELLANEOUS) IMPLANT
KIT ROOM TURNOVER OR (KITS) ×3 IMPLANT
MANIFOLD NEPTUNE II (INSTRUMENTS) ×3 IMPLANT
NDL SUT 6 .5 CRC .975X.05 MAYO (NEEDLE) IMPLANT
NEEDLE MAYO TAPER (NEEDLE)
NS IRRIG 1000ML POUR BTL (IV SOLUTION) ×3 IMPLANT
PACK ORTHO EXTREMITY (CUSTOM PROCEDURE TRAY) ×3 IMPLANT
PAD ARMBOARD 7.5X6 YLW CONV (MISCELLANEOUS) ×6 IMPLANT
PAD CAST 4YDX4 CTTN HI CHSV (CAST SUPPLIES) IMPLANT
PADDING CAST COTTON 4X4 STRL (CAST SUPPLIES) ×6
PADDING CAST COTTON 6X4 STRL (CAST SUPPLIES) ×2 IMPLANT
SPLINT FIBERGLASS 4X30 (CAST SUPPLIES) ×2 IMPLANT
SPONGE LAP 4X18 X RAY DECT (DISPOSABLE) ×2 IMPLANT
STAPLER VISISTAT 35W (STAPLE) ×2 IMPLANT
STRIP CLOSURE SKIN 1/2X4 (GAUZE/BANDAGES/DRESSINGS) ×2 IMPLANT
SUCTION FRAZIER HANDLE 10FR (MISCELLANEOUS) ×2
SUCTION TUBE FRAZIER 10FR DISP (MISCELLANEOUS) ×1 IMPLANT
SUT VIC AB 0 CT1 27 (SUTURE) ×3
SUT VIC AB 0 CT1 27XBRD ANBCTR (SUTURE) IMPLANT
SUT VIC AB 2-0 CT1 27 (SUTURE) ×3
SUT VIC AB 2-0 CT1 TAPERPNT 27 (SUTURE) ×1 IMPLANT
SUT VIC AB 3-0 FS2 27 (SUTURE) IMPLANT
SUT VICRYL 0 TIES 12 18 (SUTURE) IMPLANT
TOWEL OR 17X24 6PK STRL BLUE (TOWEL DISPOSABLE) ×3 IMPLANT
TOWEL OR 17X26 10 PK STRL BLUE (TOWEL DISPOSABLE) ×3 IMPLANT
TUBE CONNECTING 12'X1/4 (SUCTIONS) ×1
TUBE CONNECTING 12X1/4 (SUCTIONS) ×2 IMPLANT
WATER STERILE IRR 1000ML POUR (IV SOLUTION) ×3 IMPLANT

## 2017-02-21 NOTE — Interval H&P Note (Signed)
History and Physical Interval Note:  02/21/2017 9:45 AM  Kelly Fry  has presented today for surgery, with the diagnosis of Right Ankle Chronic Lateral Instability  The various methods of treatment have been discussed with the patient and family. After consideration of risks, benefits and other options for treatment, the patient has consented to  Procedure(s): RIGHT LATERAL ANKLE RECONSTRUCTION WITH SPLIT PERONEAL LONGUS TENDON (Right) as a surgical intervention .  The patient's history has been reviewed, patient examined, no change in status, stable for surgery.  I have reviewed the patient's chart and labs.  Questions were answered to the patient's satisfaction.     Marybelle Killings

## 2017-02-21 NOTE — Transfer of Care (Signed)
Immediate Anesthesia Transfer of Care Note  Patient: Kelly Fry  Procedure(s) Performed: RIGHT LATERAL ANKLE RECONSTRUCTION WITH SPLIT PERONEAL LONGUS TENDON (Right )  Patient Location: PACU  Anesthesia Type:GA combined with regional for post-op pain  Level of Consciousness: drowsy and patient cooperative  Airway & Oxygen Therapy: Patient Spontanous Breathing and Patient connected to face mask oxygen  Post-op Assessment: Report given to RN and Post -op Vital signs reviewed and stable  Post vital signs: Reviewed and stable  Last Vitals:  Vitals:   02/21/17 0940 02/21/17 1217  BP: 134/82 (!) 146/102  Pulse: 93 (!) 105  Resp: (!) 26 14  Temp:  36.7 C  SpO2: 100% 99%    Last Pain:  Vitals:   02/21/17 1217  TempSrc:   PainSc: Asleep         Complications: No apparent anesthesia complications

## 2017-02-21 NOTE — Evaluation (Signed)
Physical Therapy Evaluation Patient Details Name: Kelly Fry MRN: [id number] DOB: [birth year] Today's Date: 02/21/2017   History of Present Illness  32 y.o. female s/p RIGHT LATERAL ANKLE RECONSTRUCTION WITH SPLIT PERONEAL LONGUS TENDON (Right). PMH includes: PONV, GERD, Anxiety, Bipolar disorder.    Clinical Impression  Patient is s/p above surgery resulting in functional limitations due to the deficits listed below (see PT Problem List). PTA, pt was living with boyfriend in 1 story home with 3 stairs to enter. Patient has support avail PRN and was ambulatory without AD prior to surgery. Upon eval, patient presents with mild post op pain and weakness along with NWB status that limits her mobility. Patient ambulating short distance in hospital room with RW today with min guard. Anticipate patient will progress quickly with therapy and will focus on crutch and stair training in next visit. Patient will benefit from skilled PT to increase their independence and safety with mobility to allow discharge to the venue listed below.       Follow Up Recommendations No PT follow up;Supervision for mobility/OOB    Equipment Recommendations  Rolling walker with 5" wheels;3in1 (PT)(Bari Walker and Bari Commode)    Recommendations for [employer]       Precautions / Restrictions Precautions Precautions: None Restrictions Weight Bearing Restrictions: Yes RLE Weight Bearing: Non weight bearing      Mobility  Bed Mobility Overal bed mobility: Modified Independent                Transfers Overall transfer level: Needs assistance Equipment used: Rolling walker (2 wheeled) Transfers: Sit to/from Stand Sit to Stand: Min guard         General transfer comment: min guard for safety and line mgt. cues for hand placement and safety with RW.   Ambulation/Gait Ambulation/Gait assistance: Min guard Ambulation Distance (Feet): 30 Feet Assistive device: Rolling walker (2 wheeled) Gait  Pattern/deviations: Step-to pattern Gait velocity: decreased   General Gait Details: hop gait inside RW. patient adhered to WB status. will try crutches next visit. good stability inside RW  Stairs            Wheelchair Mobility    Modified Rankin (Stroke Patients Only)       Balance Overall balance assessment: No apparent balance deficits (not formally assessed)                                           Pertinent Vitals/Pain Pain Assessment: Faces Faces Pain Scale: Hurts little more Pain Location: R ankle Pain Descriptors / Indicators: Grimacing;Guarding Pain Intervention(s): Limited activity within patient's tolerance;Monitored during session;Premedicated before session    Home Living Family/patient expects to be discharged to:: Private residence Living Arrangements: Spouse/significant other;Children Available Help at Discharge: (bf works during day, sister close by PRN) Type of Home: Mobile home Home Access: Stairs to enter Entrance Stairs-Rails: Can reach both Entrance Stairs-Number of Steps: 3 Home Layout: One level Home Equipment: None      Prior Function Level of Independence: Independent               Hand Dominance        Extremity/Trunk Assessment   Upper Extremity Assessment Upper Extremity Assessment: Defer to OT evaluation    Lower Extremity Assessment Lower Extremity Assessment: Overall WFL for tasks assessed(unable to assess R lower foot strength due to post op)    Cervical /  Trunk Assessment Cervical / Trunk Assessment: Normal  Communication   Communication: No difficulties  Cognition Arousal/Alertness: Awake/alert Behavior During Therapy: WFL for tasks assessed/performed Overall Cognitive Status: Within Functional Limits for tasks assessed                                        General Comments General comments (skin integrity, edema, etc.): Patient with mild dizziness and fatigue at  begining of session, VSS.     Exercises     Assessment/Plan    PT Assessment Patient needs continued PT services  PT Problem List Decreased strength;Decreased range of motion;Decreased activity tolerance;Decreased mobility;Decreased knowledge of use of DME;Pain       PT Treatment Interventions DME instruction;Stair training;Gait training;Functional mobility training;Therapeutic activities    PT Goals (Current goals can be found in the Care Plan section)  Acute Rehab PT Goals Patient Stated Goal: return home PT Goal Formulation: With patient Time For Goal Achievement: 02/28/17 Potential to Achieve Goals: Good    Frequency Min 6X/week   Barriers to discharge        Co-evaluation               AM-PAC PT "6 Clicks" Daily Activity  Outcome Measure Difficulty turning over in bed (including adjusting bedclothes, sheets and blankets)?: A Little Difficulty moving from lying on back to sitting on the side of the bed? : A Little Difficulty sitting down on and standing up from a chair with arms (e.g., wheelchair, bedside commode, etc,.)?: A Little Help needed moving to and from a bed to chair (including a wheelchair)?: A Little Help needed walking in hospital room?: A Little Help needed climbing 3-5 steps with a railing? : A Little 6 Click Score: 18    End of Session Equipment Utilized During Treatment: Gait belt Activity Tolerance: Patient tolerated treatment well Patient left: in chair;with call bell/phone within reach Nurse Communication: Mobility status PT Visit Diagnosis: Unsteadiness on feet (R26.81);Other abnormalities of gait and mobility (R26.89);Pain Pain - Right/Left: Right Pain - part of body: Ankle and joints of foot    Time: 7510-2585 PT Time Calculation (min) (ACUTE ONLY): 25 min   Charges:   PT Evaluation $PT Eval Low Complexity: 1 Low PT Treatments $Gait Training: 8-22 mins   PT G Codes:        Reinaldo Berber, PT, DPT Acute Rehab  Services Pager: [phone]   Reinaldo Berber 02/21/2017, 4:52 PM

## 2017-02-21 NOTE — Anesthesia Procedure Notes (Signed)
Procedure Name: LMA Insertion Date/Time: 02/21/2017 10:37 AM Performed by: Freddie Breech, CRNA Pre-anesthesia Checklist: Patient identified, Emergency Drugs available, Suction available and Patient being monitored Patient Re-evaluated:Patient Re-evaluated prior to induction Oxygen Delivery Method: Circle System Utilized Preoxygenation: Pre-oxygenation with 100% oxygen Induction Type: IV induction Ventilation: Mask ventilation without difficulty LMA: LMA with gastric port inserted LMA Size: 4.0 Number of attempts: 1 Airway Equipment and Method: Bite block Placement Confirmation: positive ETCO2 Tube secured with: Tape Dental Injury: Teeth and Oropharynx as per pre-operative assessment

## 2017-02-21 NOTE — H&P (Signed)
Kelly Fry is an 32 y.o. female.   Chief Complaint: right ankle pain HPI: patient with hx of right lateral ankle instability and pain presents for surgical intervention.  Worsening symptoms.  Failed conservative treatment.    Past Medical History:  Diagnosis Date  . Ankle instability, right   . Anxiety   . Bipolar disorder ([place])   . Complication of anesthesia   . Family history of adverse reaction to anesthesia    Dad had n/v post anesthesia  . GERD (gastroesophageal reflux disease)   . Medical history non-contributory   . Miscarriage   . PONV (postoperative nausea and vomiting)     Past Surgical History:  Procedure Laterality Date  . NO PAST SURGERIES    . WISDOM TOOTH EXTRACTION  04/12/2015    Family History  Problem Relation Age of Onset  . Kidney disease Father   . COPD Father   . Hypertension Father   . Kidney disease Sister   . Asthma Sister   . Early death Mother   . Stroke Mother   . Heart disease Mother   . Asthma Brother    Social History:  reports that  has never smoked. she has never used smokeless tobacco. She reports that she does not drink alcohol or use drugs.  Allergies:  Allergies  Allergen Reactions  . Latuda [Lurasidone] Other (See Comments)    Shaky; worsened mood    No medications prior to admission.    Results for orders placed or performed during the hospital encounter of 02/19/17 (from the past 48 hour(s))  Surgical pcr screen     Status: None   Collection Time: 02/19/17  9:34 AM  Result Value Ref Range   MRSA, PCR NEGATIVE NEGATIVE   Staphylococcus aureus NEGATIVE NEGATIVE    Comment: (NOTE) The Xpert SA Assay (FDA approved for NASAL specimens in patients 58 years of age and older), is one component of a comprehensive surveillance program. It is not intended to diagnose infection nor to guide or monitor treatment.   CBC     Status: None   Collection Time: 02/19/17  9:35 AM  Result Value Ref Range   WBC 7.6 4.0 - 10.5 K/uL    RBC 4.95 3.87 - 5.11 MIL/uL   Hemoglobin 14.1 12.0 - 15.0 g/dL   HCT 43.2 36.0 - 46.0 %   MCV 87.3 78.0 - 100.0 fL   MCH 28.5 26.0 - 34.0 pg   MCHC 32.6 30.0 - 36.0 g/dL   RDW 12.9 11.5 - 15.5 %   Platelets 247 150 - 400 K/uL  Comprehensive metabolic panel     Status: None   Collection Time: 02/19/17  9:35 AM  Result Value Ref Range   Sodium 136 135 - 145 mmol/L   Potassium 4.0 3.5 - 5.1 mmol/L   Chloride 103 101 - 111 mmol/L   CO2 24 22 - 32 mmol/L   Glucose, Bld 91 65 - 99 mg/dL   BUN 9 6 - 20 mg/dL   Creatinine, Ser 0.92 0.44 - 1.00 mg/dL   Calcium 9.1 8.9 - 10.3 mg/dL   Total Protein 7.3 6.5 - 8.1 g/dL   Albumin 3.7 3.5 - 5.0 g/dL   AST 22 15 - 41 U/L   ALT 20 14 - 54 U/L   Alkaline Phosphatase 73 38 - 126 U/L   Total Bilirubin 0.6 0.3 - 1.2 mg/dL   GFR calc non Af Amer >60 >60 mL/min   GFR calc Af Amer >60 >  60 mL/min    Comment: (NOTE) The eGFR has been calculated using the CKD EPI equation. This calculation has not been validated in all clinical situations. eGFR's persistently <60 mL/min signify possible Chronic Kidney Disease.    Anion gap 9 5 - 15  hCG, serum, qualitative     Status: None   Collection Time: 02/19/17  9:35 AM  Result Value Ref Range   Preg, Serum NEGATIVE NEGATIVE    Comment:        THE SENSITIVITY OF THIS METHODOLOGY IS >10 mIU/mL.    No results found.  Review of Systems  Constitutional: Negative.   HENT: Negative.   Eyes: Negative.   Respiratory: Negative.   Cardiovascular: Negative.   Gastrointestinal: Negative.   Genitourinary: Negative.   Musculoskeletal: Positive for joint pain.  Skin: Negative.   Neurological: Negative.   Psychiatric/Behavioral: Negative.     Last menstrual period 02/12/2017. Physical Exam  Constitutional: She is oriented to person, place, and time. She appears well-developed. No distress.  HENT:  Head: Normocephalic and atraumatic.  Eyes: EOM are normal. Pupils are equal, round, and reactive to light.   Neck: Normal range of motion.  Respiratory: No respiratory distress.  GI: She exhibits no distension.  Musculoskeletal:  Ortho Exam patient has positive anterior drawer with lateral talar dome on coverage.  Talofibular and calcaneofibular ligament.  This over the posterior talofibular ligament.  Syndesmosis is nontender.  Normal subtalar motion.   Neurological: She is alert and oriented to person, place, and time.  Skin: Skin is warm and dry.  Psychiatric: She has a normal mood and affect.     Assessment/Plan Right ankle pain and lateral instability  Will proceed with RIGHT LATERAL ANKLE RECONSTRUCTION WITH SPLIT PERONEAL LONGUS TENDON as scheduled.  Surgical procedure along with possible risks and complications discussed.  All questions answered and wishes to proceed.  Benjiman Core, PA-C 02/21/2017, 6:18 AM

## 2017-02-21 NOTE — Anesthesia Postprocedure Evaluation (Signed)
Anesthesia Post Note  Patient: Kelly Fry  Procedure(s) Performed: RIGHT LATERAL ANKLE RECONSTRUCTION WITH SPLIT PERONEAL LONGUS TENDON (Right )     Patient location during evaluation: PACU Anesthesia Type: General Level of consciousness: awake and alert Pain management: pain level controlled Vital Signs Assessment: post-procedure vital signs reviewed and stable Respiratory status: spontaneous breathing, nonlabored ventilation, respiratory function stable and patient connected to nasal cannula oxygen Cardiovascular status: blood pressure returned to baseline and stable Postop Assessment: no apparent nausea or vomiting Anesthetic complications: no    Last Vitals:  Vitals:   02/21/17 1315 02/21/17 1346  BP: 114/70 131/70  Pulse: 84 89  Resp: 14 16  Temp: 36.7 C (!) 36.4 C  SpO2: 99% 98%    Last Pain:  Vitals:   02/21/17 1346  TempSrc: Oral  PainSc:                  Arnie Maiolo COKER

## 2017-02-21 NOTE — Evaluation (Signed)
Occupational Therapy Evaluation Patient Details Name: Kelly Fry MRN: [id number] DOB: 06-22-1985 Today's Date: 02/21/2017    History of Present Illness 32 y.o. female s/p RIGHT LATERAL ANKLE RECONSTRUCTION WITH SPLIT PERONEAL LONGUS TENDON (Right). PMH includes: PONV, GERD, Anxiety, Bipolar disorder.   Clinical Impression   This 32 y/o F presents with the above. At baseline Pt is independent with ADLs and functional mobility. Pt presents with post-op pain/weakness, decreased functional performance. Pt able to complete sit<>stand at RW and take few steps forward/back from recliner with MinGuard-MinA at [place] level. Pt currently requires [place] for LB ADLs secondary to RLE functional limitations. Pt will return home with assist from family for ADLs PRN. Pt will benefit from continued acute OT services to maximize her safety and independence with ADLs and mobility prior to return home.     Follow Up Recommendations  No OT follow up;Supervision/Assistance - 24 hour    Equipment Recommendations  3 in 1 bedside commode;Other (comment)(bariatric 3:1; tub bench? )           Precautions / Restrictions Precautions Precautions: None Restrictions Weight Bearing Restrictions: Yes RLE Weight Bearing: Non weight bearing      Mobility Bed Mobility Overal bed mobility: Modified Independent             General bed mobility comments: OOB in recliner with PT  Transfers Overall transfer level: Needs assistance Equipment used: Rolling walker (2 wheeled) Transfers: Sit to/from Stand Sit to Stand: Min guard         General transfer comment: min guard for safety and line mgt. cues for hand placement and safety with RW.     Balance Overall balance assessment: Needs assistance Sitting-balance support: Feet supported Sitting balance-Leahy Scale: Good     Standing balance support: Bilateral upper extremity supported Standing balance-Leahy Scale: Poor Standing balance comment: reliant on  UE support                            ADL either performed or assessed with clinical judgement   ADL Overall ADL's : Needs assistance/impaired Eating/Feeding: Modified independent;Sitting   Grooming: Set up;Sitting   Upper Body Bathing: Min guard;Sitting   Lower Body Bathing: Minimal assistance;Sit to/from stand   Upper Body Dressing : Sitting   Lower Body Dressing: Moderate assistance;Sit to/from stand   Toilet Transfer: Minimal assistance;Stand-pivot;BSC;RW Armed forces technical officer Details (indicate cue type and reason): simulated in transfer to/from recliner  Toileting- Water quality scientist and Hygiene: Moderate assistance;Sit to/from Nurse, children's Details (indicate cue type and reason): began discussion regarding DME for use in shower (pending medical clearance to do so); will continue education/review in additional sessions  Functional mobility during ADLs: Minimal assistance;Rolling walker General ADL Comments: Pt seated in recliner upon arrival, completes sit<>stand and able to take few steps foward/back at RW level with MinA, good maintenance of NWB status; began education on DME, safety and compensatory techniques for completing ADLs                          Pertinent Vitals/Pain Pain Assessment: Faces Faces Pain Scale: Hurts even more Pain Location: R ankle Pain Descriptors / Indicators: Grimacing;Guarding Pain Intervention(s): Limited activity within patient's tolerance;Monitored during session;Repositioned          Extremity/Trunk Assessment Upper Extremity Assessment Upper Extremity Assessment: Overall WFL for tasks assessed   Lower Extremity Assessment Lower Extremity Assessment: Defer to PT evaluation  Cervical / Trunk Assessment Cervical / Trunk Assessment: Normal   Communication Communication Communication: No difficulties   Cognition Arousal/Alertness: Awake/alert Behavior During Therapy: WFL for tasks  assessed/performed Overall Cognitive Status: Within Functional Limits for tasks assessed                                     General Comments  Patient with mild dizziness and fatigue at begining of session, VSS.                Home Living Family/patient expects to be discharged to:: Private residence Living Arrangements: Spouse/significant other;Children Available Help at Discharge: (BF works during Soil scientist lives close by PRN) Type of Home: Mobile home Home Access: Stairs to enter Technical brewer of Steps: 3 Entrance Stairs-Rails: Can reach both [place]: One level     Bathroom Shower/Tub: Tub only   Biochemist, clinical: [place]: None          Prior Functioning/Environment Level of Independence: Independent                 OT Problem List: Decreased strength;Impaired balance (sitting and/or standing);Pain;Decreased activity tolerance;Decreased knowledge of use of DME or AE;Obesity      OT Treatment/Interventions: Self-care/ADL training;DME and/or AE instruction;Therapeutic activities;Balance training;Therapeutic exercise;Energy conservation;Patient/family education    OT Goals(Current goals can be found in the care plan section) Acute Rehab OT Goals Patient Stated Goal: return home OT Goal Formulation: With patient Time For Goal Achievement: 03/07/17 Potential to Achieve Goals: Good  OT Frequency: Min 2X/week                             AM-PAC PT "6 Clicks" Daily Activity     Outcome Measure Help from another person eating meals?: None Help from another person taking care of personal grooming?: A Little Help from another person toileting, which includes using toliet, bedpan, or urinal?: A Little Help from another person bathing (including washing, rinsing, drying)?: A Lot Help from another person to put on and taking off regular upper body clothing?: None Help from another person to put on and taking  off regular lower body clothing?: A Lot 6 Click Score: 18   End of Session Equipment Utilized During Treatment: Rolling walker Nurse Communication: Mobility status  Activity Tolerance: Patient tolerated treatment well Patient left: in chair;with call bell/phone within reach  OT Visit Diagnosis: Unsteadiness on feet (R26.81);Other abnormalities of gait and mobility (R26.89)                Time: 6568-1275 OT Time Calculation (min): 18 min Charges:  OT General Charges $OT Visit: 1 Visit OT Evaluation $OT Eval Low Complexity: 1 Low G-Codes:     Lou Cal, OT Pager [phone] 02/21/2017   Raymondo Band 02/21/2017, 5:03 PM

## 2017-02-21 NOTE — Anesthesia Preprocedure Evaluation (Addendum)
Anesthesia Evaluation  Patient identified by MRN, date of birth, ID band Patient awake    Reviewed: Allergy & Precautions, NPO status , Patient's Chart, lab work & pertinent test results  Airway Mallampati: III  TM Distance: >3 FB Neck ROM: Full    Dental  (+) Teeth Intact, Dental Advisory Given   Pulmonary    breath sounds clear to auscultation       Cardiovascular  Rhythm:Regular Rate:Normal     Neuro/Psych    GI/Hepatic   Endo/Other    Renal/GU      Musculoskeletal   Abdominal (+) + obese,   Peds  Hematology   Anesthesia Other Findings   Reproductive/Obstetrics                             Anesthesia Physical Anesthesia Plan  ASA: II  Anesthesia Plan: General   Post-op Pain Management:  Regional for Post-op pain   Induction: Intravenous  PONV Risk Score and Plan: Ondansetron and Dexamethasone  Airway Management Planned: LMA  Additional Equipment:   Intra-op Plan:   Post-operative Plan:   Informed Consent: I have reviewed the patients History and Physical, chart, labs and discussed the procedure including the risks, benefits and alternatives for the proposed anesthesia with the patient or authorized representative who has indicated his/her understanding and acceptance.   Dental advisory given  Plan Discussed with: CRNA and Anesthesiologist  Anesthesia Plan Comments:         Anesthesia Quick Evaluation

## 2017-02-22 ENCOUNTER — Other Ambulatory Visit: Payer: Self-pay

## 2017-02-22 MED ORDER — OXYCODONE-ACETAMINOPHEN 5-325 MG PO TABS: 1.0000 | ORAL_TABLET | Freq: Four times a day (QID) | ORAL | 0 refills | Status: DC | PRN

## 2017-02-22 NOTE — Progress Notes (Signed)
Physical Therapy Treatment Patient Details Name: Kelly Fry MRN: [id number] DOB: 05-31-1985 Today's Date: 02/22/2017    History of Present Illness 32 y.o. female s/p RIGHT LATERAL ANKLE RECONSTRUCTION WITH SPLIT PERONEAL LONGUS TENDON (Right). PMH includes: PONV, GERD, Anxiety, Bipolar disorder.    PT Comments    Patient is making progress toward mobility goals. Pt tolerated gait training using bilat crutches and RW and stair training. Pt is unsteady with use of crutches and became fatigued quickly. Overall pt required supervision/min guard for OOB mobility. Current plan remains appropriate.    Follow Up Recommendations  No PT follow up;Supervision for mobility/OOB     Equipment Recommendations  Rolling walker with 5" wheels;3in1 (PT)(Bari Walker and Bari Commode)    Recommendations for [employer]       Precautions / Restrictions Precautions Precautions: None Restrictions Weight Bearing Restrictions: Yes RLE Weight Bearing: Non weight bearing    Mobility  Bed Mobility Overal bed mobility: Modified Independent             General bed mobility comments: OOB in recliner with PT  Transfers Overall transfer level: Needs assistance Equipment used: Rolling walker (2 wheeled) Transfers: Sit to/from Stand Sit to Stand: Supervision;Min guard         General transfer comment: min guard for safety with bilat crutches and supervision for safety with RW; cues and demonstration of standing/sitting with crutches and cues for safe hand placement when using RW  Ambulation/Gait Ambulation/Gait assistance: Min guard Ambulation Distance (Feet): (49ft with crutches and 9ft with RW) Assistive device: Rolling walker (2 wheeled) Gait Pattern/deviations: Step-to pattern Gait velocity: decreased   General Gait Details: cues for sequencing and safe use of AD; pt unsteady with use of crutches and more SOB   Stairs Stairs: Yes   Stair Management: No rails;Step to  pattern;Two rails;Backwards;With walker Number of Stairs: 2 General stair comments: cues for sequencing and technique; pt ascended first step with RW and second step using bilat hand rails to simulate home entrance  Wheelchair Mobility    Modified Rankin (Stroke Patients Only)       Balance Overall balance assessment: Needs assistance Sitting-balance support: Feet supported Sitting balance-Leahy Scale: Good     Standing balance support: Bilateral upper extremity supported Standing balance-Leahy Scale: Poor Standing balance comment: reliant on UE support                             Cognition Arousal/Alertness: Awake/alert Behavior During Therapy: WFL for tasks assessed/performed Overall Cognitive Status: Within Functional Limits for tasks assessed                                        Exercises      General Comments        Pertinent Vitals/Pain Pain Assessment: Faces Faces Pain Scale: Hurts little more Pain Location: R ankle Pain Descriptors / Indicators: Guarding;Throbbing Pain Intervention(s): Limited activity within patient's tolerance;Monitored during session;Premedicated before session;Repositioned;Ice applied    Home Living Family/patient expects to be discharged to:: Private residence Living Arrangements: Spouse/significant other;Children                  Prior Function            PT Goals (current goals can now be found in the care plan section) Acute Rehab PT Goals Patient Stated Goal: return home  PT Goal Formulation: With patient Time For Goal Achievement: 02/28/17 Potential to Achieve Goals: Good Progress towards PT goals: Progressing toward goals    Frequency    Min 6X/week      PT Plan Current plan remains appropriate    Co-evaluation              AM-PAC PT "6 Clicks" Daily Activity  Outcome Measure  Difficulty turning over in bed (including adjusting bedclothes, sheets and blankets)?: A  Little Difficulty moving from lying on back to sitting on the side of the bed? : A Little Difficulty sitting down on and standing up from a chair with arms (e.g., wheelchair, bedside commode, etc,.)?: A Little Help needed moving to and from a bed to chair (including a wheelchair)?: A Little Help needed walking in hospital room?: A Little Help needed climbing 3-5 steps with a railing? : A Little 6 Click Score: 18    End of Session Equipment Utilized During Treatment: Gait belt Activity Tolerance: Patient tolerated treatment well Patient left: in chair;with call bell/phone within reach Nurse Communication: Mobility status PT Visit Diagnosis: Unsteadiness on feet (R26.81);Other abnormalities of gait and mobility (R26.89);Pain Pain - Right/Left: Right Pain - part of body: Ankle and joints of foot     Time: 7416-3845 PT Time Calculation (min) (ACUTE ONLY): 30 min  Charges:  $Gait Training: 8-22 mins $Therapeutic Activity: 8-22 mins                    G Codes:       Earney Navy, PTA Pager: [phone]     Darliss Cheney 02/22/2017, 9:50 AM

## 2017-02-22 NOTE — Progress Notes (Signed)
Occupational Therapy Treatment Patient Details Name: Kelly Fry MRN: [id number] DOB: 06/25/1985 Today's Date: 02/22/2017    History of present illness 32 y.o. female s/p RIGHT LATERAL ANKLE RECONSTRUCTION WITH SPLIT PERONEAL LONGUS TENDON (Right). PMH includes: PONV, GERD, Anxiety, Bipolar disorder.   OT comments  Pt. Able to complete toileting and grooming tasks S/min guard a.  Reviewed bathing options and DME.  D/c home later today with 24/A from boyfriend, brother, and sister.    Follow Up Recommendations  No OT follow up;Supervision/Assistance - 24 hour    Equipment Recommendations  3 in 1 bedside commode;Other (comment)    Recommendations for Other Services      Precautions / Restrictions Precautions Precautions: None Restrictions Weight Bearing Restrictions: Yes RLE Weight Bearing: Non weight bearing       Mobility Bed Mobility Overal bed mobility: Modified Independent             General bed mobility comments: OOB in recliner beginning and end of session  Transfers Overall transfer level: Needs assistance Equipment used: Rolling walker (2 wheeled) Transfers: Sit to/from [employer] Transfers Sit to Stand: Supervision;Min guard         General transfer comment: min guard for safety with bilat crutches and supervision for safety with RW; cues and demonstration of standing/sitting with crutches and cues for safe hand placement when using RW    Balance Overall balance assessment: Needs assistance Sitting-balance support: Feet supported Sitting balance-Leahy Scale: Good     Standing balance support: Bilateral upper extremity supported Standing balance-Leahy Scale: Poor Standing balance comment: reliant on UE support                            ADL either performed or assessed with clinical judgement   ADL Overall ADL's : Needs assistance/impaired     Grooming: Min guard;Standing Grooming Details (indicate cue type and reason):  simulated during b.room activities during session                 Toilet Transfer: Min guard;Ambulation;RW   Toileting- Water quality scientist and Hygiene: Min guard;Sit to/from stand Toileting - Clothing Manipulation Details (indicate cue type and reason): simulated positioning, pt. reports she can not perfrom hygiene standing inside of bsc as it is not wide enough, trouble shooted and had pt. take 2 setps forward away from bsc and she was able to reach front and buttocks areas   Tub/Shower Transfer Details (indicate cue type and reason): pt. states her boyfriend is determining if they will be able to get a tub bench.  reviewed thinking of any family or friends with past sx. as people often have equipment they forget about and they could borrow.  she agreed and states they will sponge bathe if unable to obtain a bench Functional mobility during ADLs: Min guard;Rolling walker General ADL Comments: LOB x2 one while amb. into the b.room cues to slow down!! able to self correct LOB without physical assistance     Vision       Perception     Praxis      Cognition Arousal/Alertness: Awake/alert Behavior During Therapy: WFL for tasks assessed/performed Overall Cognitive Status: Within Functional Limits for tasks assessed                                          Exercises  Shoulder Instructions       General Comments      Pertinent Vitals/ Pain       Pain Assessment: 0-10 Pain Score: 5  Faces Pain Scale: Hurts little more Pain Location: R LE Pain Descriptors / Indicators: Guarding;Throbbing Pain Intervention(s): Monitored during session;Repositioned;Ice applied  Home Living Family/patient expects to be discharged to:: Private residence Living Arrangements: Spouse/significant other;Children                                      Prior Functioning/Environment              Frequency  Min 2X/week        Progress Toward  Goals  OT Goals(current goals can now be found in the care plan section)  Progress towards OT goals: Progressing toward goals  Acute Rehab OT Goals Patient Stated Goal: return home  Plan      Co-evaluation                 AM-PAC PT "6 Clicks" Daily Activity     Outcome Measure   Help from another person eating meals?: None Help from another person taking care of personal grooming?: A Little Help from another person toileting, which includes using toliet, bedpan, or urinal?: A Little Help from another person bathing (including washing, rinsing, drying)?: A Lot Help from another person to put on and taking off regular upper body clothing?: None Help from another person to put on and taking off regular lower body clothing?: A Lot 6 Click Score: 18    End of Session Equipment Utilized During Treatment: Gait belt;Rolling walker  OT Visit Diagnosis: Unsteadiness on feet (R26.81);Other abnormalities of gait and mobility (R26.89)   Activity Tolerance Patient tolerated treatment well   Patient Left in chair;with call bell/phone within reach   Nurse Communication          Time: 6283-6629 OT Time Calculation (min): 10 min  Charges: OT General Charges $OT Visit: 1 Visit OT Treatments $Self Care/Home Management : 8-22 mins  Janice Coffin, COTA/L 02/22/2017, 10:20 AM

## 2017-02-22 NOTE — Care Management Note (Signed)
Case Management Note  Patient Details  Name: Kelly Fry MRN: [id number] Date of Birth: [birth year]  Subjective/Objective:   32 yr old female s/p right ankle recontruction.                  Action/Plan: Patient has no Home Health needs. DME has been ordered.    Expected Discharge Date:  02/22/17               Expected Discharge Plan:  Home/Self Care  In-House Referral:  NA  Discharge planning Services  CM Consult  Post Acute Care Choice:  Durable Medical Equipment Choice offered to:  NA  DME Arranged:  3-N-1, Walker rolling(Bariatric RW and 3in1 requested) DME Agency:  [place] NA  HH Arranged:  NA HH Agency:  NA  Status of Service:  Completed, signed off  If discussed at [place] of Stay Meetings, dates discussed:    Additional Comments:  Ninfa Meeker, RN 02/22/2017, 10:05 AM

## 2017-02-22 NOTE — Progress Notes (Signed)
Pt given prescriptions and discharge instructions. Instructions gone over with her and answered all questions to satisfaction. All belongings gathered to be sent home with pt. Pt in no distress at time of discharge. Sister is picking her up to take her home.

## 2017-02-22 NOTE — Op Note (Signed)
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  Postop diagnosis: Same  Procedure: Right lateral ankle reconstruction using peroneal longus partial tendon.  Surgeon: Rodell Perna MD  Assistant: Benjiman Core PA-C medically necessary and present for the entire procedure  Anesthesia preoperative popliteal block plus LMA.  Brief history: 32 year old female with greater than 6 ankle sprains even on level ground with MRI documented tearing of the anterior talofibular ligament and also the calcaneofibular ligament.  She has had recurrent sprains and 3 significant ankle sprains in the last year.  Procedure: After preoperative block LMA tube placement with general anesthesia lateral position beanbag axillary roll proximal thigh tourniquet had been applied usual DuraPrep was performed up to the tourniquet extremity sheets and drapes.  Sterile skin marker was used and a sterile glove cover the toes.  Lateral incision was drawn over the fibula.  Leg was elevated wrapped with an Esmarch and tourniquet was inflated.  End of the fibula was exposed and distally below the ankle peroneal longus and brevis tendon sheath was opened up.  Proximally above the retinaculum sheath was opened peroneal longus was identified with plantarflexion of the foot and 4 mm strip was harvested using the pig tail tendon stripper extending proximally up to the musculotendinous junction releasing the tendon.  It was then passed through the retinaculum distally to make this trip longer.  Sural nerve was protected and peroneal tendons were pulled anteriorly exposing the insertion site of the calcaneal fibular ligament on the calcaneus.  K wire was drilled 4.5 mm hole was drilled.  #2 FiberWire was placed with a whipstitch through the end of the tendon and using a push lock anchor which was 17 x 4.5 mm the tendon was placed in the hole and anchored down twisting the screw until it was countersunk underneath the bone.  Tendon was passed  underneath the peroneal tendons and then a hole was drilled from the tip of the fibula aiming anterior to avoid damage to the groove and the distal fibula for the peroneal tendons.  Second drill was drilled anteriorly at the old stump of the anterior talofibular ligament attachment on the fibula.  2 holes were connected and then the tendon graft was passed with a nitinol wire through the hole.  The graft was tensioned.  Capsule was left intact and then the neck of the fibula at the articular surface junction was identified.  Hohmann retractors were placed above and below the neck for complete visualization and a second anchor was placed.  The tendon graft was pulled to the hall and additional 17 mm was taken tendon was cut whipstitch was placed and then in standard fashion it was secured keeping the ankle in dorsiflexion position and eversion.  Attention of the graft showed that the reconstruction of the calcaneofibular ligament and anterior talofibular ligament was tight.  There was negative anterior drawer.  The capsule was then advanced with a standard Brostrom repair over the top to help tighten the ligaments.  Retinaculum was closed proximally distally over the peroneal tendons.  Wound was vigorously irrigated tourniquet deflated.  Subtendinous tissue reapproximated and skin closure postop dressing with splint was applied.  Patient tolerated procedure well was transferred to the recovery room.

## 2017-02-22 NOTE — Progress Notes (Addendum)
   Subjective: 1 Day Post-Op Procedure(s) (LRB): RIGHT LATERAL ANKLE RECONSTRUCTION WITH SPLIT PERONEAL LONGUS TENDON (Right) Patient reports pain as mild.  Popliteal block starting to wear off.   Objective: Vital signs in last 24 hours: Temp:  [97.5 F (36.4 C)-98.8 F (37.1 C)] 98.3 F (36.8 C) (01/12 0607) Pulse Rate:  [66-114] 76 (01/12 0607) Resp:  [9-26] 14 (01/11 2030) BP: (99-157)/(56-104) 99/70 (01/12 0607) SpO2:  [91 %-100 %] 97 % (01/12 0607)  Intake/Output from previous day: 01/11 0701 - 01/12 0700 In: 1250 [I.V.:1200; IV Piggyback:50] Out: 25 [Blood:25] Intake/Output this shift: No intake/output data recorded.  Recent Labs    02/19/17 0935  HGB 14.1   Recent Labs    02/19/17 0935  WBC 7.6  RBC 4.95  HCT 43.2  PLT 247   Recent Labs    02/19/17 0935  NA 136  K 4.0  CL 103  CO2 24  BUN 9  CREATININE 0.92  GLUCOSE 91  CALCIUM 9.1   No results for input(s): LABPT, INR in the last 72 hours.  splint intact.  No results found.  Assessment/Plan: 1 Day Post-Op Procedure(s) (LRB): RIGHT LATERAL ANKLE RECONSTRUCTION WITH SPLIT PERONEAL LONGUS TENDON (Right) Up with therapy discharge home this AM NWB. Therapy to work with her , crutches /walker or both.   Kelly Fry 02/22/2017, 7:33 AM

## 2017-02-22 NOTE — Discharge Instructions (Signed)
Keep foot elevated above heart for pain relief. Do not put weight on your foot. See Dr. Lorin Mercy in [facility] on Thursday in about 12 days, call for appt time. Take one aspirin a day to help prevent blood clot problems. You may take over the counter advil or aleve to also help with the pain.

## 2017-02-24 ENCOUNTER — Encounter (HOSPITAL_COMMUNITY): Payer: Self-pay | Admitting: Orthopaedic Surgery

## 2017-02-24 DIAGNOSIS — M25371 Other instability, right ankle: Secondary | ICD-10-CM | POA: Diagnosis not present

## 2017-02-28 NOTE — Discharge Summary (Signed)
Patient ID: Kelly Fry MRN: [id number] DOB/AGE: 11-Jul-1985 32 y.o.  Admit date: 02/21/2017 Discharge date: 02/28/2017  Admission Diagnoses:  Active Problems:   Ankle instability, right   Discharge Diagnoses:  Active Problems:   Ankle instability, right  status post Procedure(s): RIGHT LATERAL ANKLE RECONSTRUCTION WITH SPLIT PERONEAL LONGUS TENDON  Past Medical History:  Diagnosis Date  . Ankle instability, right   . Anxiety   . Bipolar disorder ([place])   . Complication of anesthesia   . Family history of adverse reaction to anesthesia    Dad had n/v post anesthesia  . GERD (gastroesophageal reflux disease)   . Medical history non-contributory   . Miscarriage   . PONV (postoperative nausea and vomiting)     Surgeries: Procedure(s): RIGHT LATERAL ANKLE RECONSTRUCTION WITH SPLIT PERONEAL LONGUS TENDON on 02/21/2017   Consultants:   Discharged Condition: Improved  Hospital Course: Kelly Fry is an 32 y.o. female who was admitted 02/21/2017 for operative treatment of right lateral ankle instability. Patient failed conservative treatments (please see the history and physical for the specifics) and had severe unremitting pain that affects sleep, daily activities and work/hobbies. After pre-op clearance, the patient was taken to the operating room on 02/21/2017 and underwent  Procedure(s): RIGHT LATERAL ANKLE RECONSTRUCTION WITH SPLIT PERONEAL LONGUS TENDON.    Patient was given perioperative antibiotics:  Anti-infectives (From admission, onward)   Start     Dose/Rate Route Frequency Ordered Stop   02/21/17 1600  ceFAZolin (ANCEF) IVPB 1 g/50 mL premix     1 g 100 mL/hr over 30 Minutes Intravenous Every 8 hours 02/21/17 1324 02/21/17 2220   02/21/17 0600  ceFAZolin (ANCEF) 3 g in dextrose 5 % 50 mL IVPB     3 g 130 mL/hr over 30 Minutes Intravenous To Short Stay 02/20/17 0752 02/21/17 1055       Patient was given sequential compression devices and early  ambulation to prevent DVT.   Patient benefited maximally from hospital stay and there were no complications. At the time of discharge, the patient was urinating/moving their bowels without difficulty, tolerating a regular diet, pain is controlled with oral pain medications and they have been cleared by PT/OT.   Recent vital signs: No data found.   Recent laboratory studies: No results for input(s): WBC, HGB, HCT, PLT, NA, K, CL, CO2, BUN, CREATININE, GLUCOSE, INR, CALCIUM in the last 72 hours.  Invalid input(s): PT, 2   Discharge Medications:   Allergies as of 02/22/2017      Reactions   Latuda [lurasidone] Other (See Comments)   Shaky; worsened mood      Medication List    TAKE these medications   albuterol 108 (90 Base) MCG/ACT inhaler Commonly known as:  PROVENTIL HFA;VENTOLIN HFA Inhale 2 puffs into the lungs every 6 (six) hours as needed for wheezing or shortness of breath.   famotidine 20 MG tablet Commonly known as:  PEPCID Take 1 tablet (20 mg total) by mouth 2 (two) times daily.   oxyCODONE-acetaminophen 5-325 MG tablet Commonly known as:  ROXICET Take 1-2 tablets by mouth every 6 (six) hours as needed.   pantoprazole 40 MG tablet Commonly known as:  PROTONIX Take 1 tablet (40 mg total) by mouth daily. What changed:    when to take this  reasons to take this   TRI-LO-ESTARYLLA 0.18/0.215/0.25 MG-25 MCG tab Generic drug:  Norgestimate-Ethinyl Estradiol Triphasic TAKE 1 TABLET BY MOUTH ONCE DAILY       Diagnostic Studies:  No results found.    Follow-up Information    Marybelle Killings, MD Follow up in 12 day(s).   Specialty:  Orthopedic Surgery Contact information: [place] [address] [phone]           Discharge Plan:  discharge to home  Disposition:     Signed: Benjiman Core  02/28/2017, 3:10 PM

## 2017-03-06 ENCOUNTER — Encounter (INDEPENDENT_AMBULATORY_CARE_PROVIDER_SITE_OTHER): Payer: Self-pay | Admitting: Orthopaedic Surgery

## 2017-03-06 ENCOUNTER — Ambulatory Visit (INDEPENDENT_AMBULATORY_CARE_PROVIDER_SITE_OTHER): Payer: Medicaid Other | Admitting: Orthopaedic Surgery

## 2017-03-06 VITALS — BP 196/62 | HR 55

## 2017-03-06 DIAGNOSIS — M25371 Other instability, right ankle: Secondary | ICD-10-CM

## 2017-03-06 NOTE — Progress Notes (Signed)
   Post-Op Visit Note   Patient: Kelly Fry           Date of Birth: 1985-10-10           MRN: [id number] Visit Date: 03/06/2017 PCP: Timmothy Euler, MD   Assessment & Plan: Follow-up right lateral ankle reconstruction with portion of peroneal longus tendon.  Steri-Strips applied and changed incision looks good we will place her in a cam boot recheck 4 weeks and then we will begin some partial weightbearing.  When she removes the Cam boot to wash her foot she can work on some gentle ankle range of motion exercises which we discussed.  Use ibuprofen or Aleve for pain.  Chief Complaint:  Chief Complaint  Patient presents with  . Right Ankle - Routine Post Op   Visit Diagnoses:  1. Ankle instability, right     Plan: Return in 4 weeks.  Will start 50% weightbearing at that point.  Follow-Up Instructions: No Follow-up on file.   Orders:  No orders of the defined types were placed in this encounter.  No orders of the defined types were placed in this encounter.   Imaging: No results found.  PMFS History: Patient Active Problem List   Diagnosis Date Noted  . 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 Vitamin D deficiency 07/04/2015  . GAD (generalized anxiety disorder) 06/30/2015  . Mood disorder ([place]) 03/28/2015  . BMI 40.0-44.9, adult ([place]) 12/21/2014  . Rh negative, antepartum 06/03/2013   Past Medical History:  Diagnosis Date  . Ankle instability, right   . Anxiety   . Bipolar disorder ([place])   . Complication of anesthesia   . Family history of adverse reaction to anesthesia    Dad had n/v post anesthesia  . GERD (gastroesophageal reflux disease)   . Medical history non-contributory   . Miscarriage   . PONV (postoperative nausea and vomiting)     Family History  Problem Relation Age of Onset  . Kidney disease Father   . COPD Father   . Hypertension Father   . Kidney disease Sister   . Asthma Sister   . Early death  Mother   . Stroke Mother   . Heart disease Mother   . Asthma Brother     Past Surgical History:  Procedure Laterality Date  . ANKLE RECONSTRUCTION Right 02/21/2017   Procedure: RIGHT LATERAL ANKLE RECONSTRUCTION WITH SPLIT PERONEAL LONGUS TENDON;  Surgeon: Marybelle Killings, MD;  Location: [facility];  Service: Orthopedics;  Laterality: Right;  . NO PAST SURGERIES    . WISDOM TOOTH EXTRACTION  04/12/2015   Social History   Occupational History  . Not on file  Tobacco Use  . Smoking status: Never Smoker  . Smokeless tobacco: Never Used  Substance and Sexual Activity  . Alcohol use: No  . Drug use: No  . Sexual activity: Yes    Birth control/protection: IUD

## 2017-03-06 NOTE — Progress Notes (Deleted)
0

## 2017-04-03 ENCOUNTER — Ambulatory Visit (INDEPENDENT_AMBULATORY_CARE_PROVIDER_SITE_OTHER): Payer: Medicaid Other | Admitting: Orthopaedic Surgery

## 2017-04-03 ENCOUNTER — Encounter (INDEPENDENT_AMBULATORY_CARE_PROVIDER_SITE_OTHER): Payer: Self-pay | Admitting: Orthopaedic Surgery

## 2017-04-03 VITALS — BP 134/86 | HR 130

## 2017-04-03 DIAGNOSIS — M25371 Other instability, right ankle: Secondary | ICD-10-CM

## 2017-04-03 NOTE — Progress Notes (Signed)
   Post-Op Visit Note   Patient: Kelly Fry           Date of Birth: 12-Jun-1985           MRN: [id number] Visit Date: 04/03/2017 PCP: Timmothy Euler, MD   Assessment & Plan: 6 weeks post lateral ankle reconstruction using a portion of the peroneal longus tendon.  She still has some some mild swelling over the peroneal sheath as expected.  Scars well-healed.  She can weight-bear fully in the boot and then take the boot off use a tennis shoe use her walker weight-bear as tolerated work on ankle range of motion and doing her ankle strengthening exercises from therapy which she still has the sheets.  I plan to recheck her in 2 months.  She is getting ready to look for a different job if she needs a release note for work she will call us.  Chief Complaint:  Chief Complaint  Patient presents with  . Right Ankle - Routine Post Op, Follow-up   Visit Diagnoses:  1. Right ankle instability     Plan: In 2 months.  Follow-Up Instructions: No Follow-up on file.   Orders:  No orders of the defined types were placed in this encounter.  No orders of the defined types were placed in this encounter.   Imaging: No results found.  PMFS History: Patient Active Problem List   Diagnosis Date Noted  . 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 Vitamin D deficiency 07/04/2015  . GAD (generalized anxiety disorder) 06/30/2015  . Mood disorder ([place]) 03/28/2015  . BMI 40.0-44.9, adult ([place]) 12/21/2014  . Rh negative, antepartum 06/03/2013   Past Medical History:  Diagnosis Date  . Ankle instability, right   . Anxiety   . Bipolar disorder ([place])   . Complication of anesthesia   . Family history of adverse reaction to anesthesia    Dad had n/v post anesthesia  . GERD (gastroesophageal reflux disease)   . Medical history non-contributory   . Miscarriage   . PONV (postoperative nausea and vomiting)     Family History  Problem Relation Age of Onset  .  Kidney disease Father   . COPD Father   . Hypertension Father   . Kidney disease Sister   . Asthma Sister   . Early death Mother   . Stroke Mother   . Heart disease Mother   . Asthma Brother     Past Surgical History:  Procedure Laterality Date  . ANKLE RECONSTRUCTION Right 02/21/2017   Procedure: RIGHT LATERAL ANKLE RECONSTRUCTION WITH SPLIT PERONEAL LONGUS TENDON;  Surgeon: Marybelle Killings, MD;  Location: [facility];  Service: Orthopedics;  Laterality: Right;  . NO PAST SURGERIES    . WISDOM TOOTH EXTRACTION  04/12/2015   Social History   Occupational History  . Not on file  Tobacco Use  . Smoking status: Never Smoker  . Smokeless tobacco: Never Used  Substance and Sexual Activity  . Alcohol use: No  . Drug use: No  . Sexual activity: Yes    Birth control/protection: IUD

## 2017-04-30 DIAGNOSIS — H52223 Regular astigmatism, bilateral: Secondary | ICD-10-CM | POA: Diagnosis not present

## 2017-04-30 DIAGNOSIS — H5203 Hypermetropia, bilateral: Secondary | ICD-10-CM | POA: Diagnosis not present

## 2017-05-29 ENCOUNTER — Inpatient Hospital Stay (INDEPENDENT_AMBULATORY_CARE_PROVIDER_SITE_OTHER): Payer: Medicaid Other | Admitting: Orthopaedic Surgery

## 2017-06-05 ENCOUNTER — Encounter (INDEPENDENT_AMBULATORY_CARE_PROVIDER_SITE_OTHER): Payer: Self-pay | Admitting: Orthopaedic Surgery

## 2017-06-05 ENCOUNTER — Ambulatory Visit (INDEPENDENT_AMBULATORY_CARE_PROVIDER_SITE_OTHER): Payer: Medicaid Other | Admitting: Orthopaedic Surgery

## 2017-06-05 VITALS — BP 136/83 | HR 86 | Ht 64.0 in | Wt 301.0 lb

## 2017-06-05 DIAGNOSIS — M25371 Other instability, right ankle: Secondary | ICD-10-CM

## 2017-06-05 NOTE — Progress Notes (Signed)
Office Visit Note   Patient: Kelly Fry           Date of Birth: 18-Dec-1985           MRN: [id number] Visit Date: 06/05/2017              Requested by: Timmothy Euler, MD [place], [place] [address] PCP: Timmothy Euler, MD   Assessment & Plan: Visit Diagnoses:  1. Ankle instability, right     Plan: Patient is happy with the surgical result she has no instability good range of motion incisions well-healed.  Is having some back problems and is currently seeing a chiropractor.  She is happy with the ankle surgery result and can return to see me on an as-needed basis.  Follow-Up Instructions: Return if symptoms worsen or fail to improve.   Orders:  No orders of the defined types were placed in this encounter.  No orders of the defined types were placed in this encounter.     Procedures: No procedures performed   Clinical Data: No additional findings.   Subjective: Chief Complaint  Patient presents with  . Right Ankle - Follow-up    HPI 32 year old female returns post right lateral ankle reconstruction with split peroneal longus tendon surgery done 02/21/2017.  She has good ankle range of motion negative anterior drawer test.  Excellent peroneal resisted strength testing and normal heel toe gait.  Review of Systems dated and unchanged from last office visit.   Objective: Vital Signs: BP 136/83   Pulse 86   Ht 5\' 4"  (1.626 m)   Wt (!) 301 lb (136.5 kg)   BMI 51.67 kg/m   Physical Exam  Constitutional: She is oriented to person, place, and time. She appears well-developed.  HENT:  Head: Normocephalic.  Right Ear: External ear normal.  Left Ear: External ear normal.  Eyes: Pupils are equal, round, and reactive to light.  Neck: No tracheal deviation present. No thyromegaly present.  Cardiovascular: Normal rate.  Pulmonary/Chest: Effort normal.  Abdominal: Soft.  Neurological: She is alert and oriented to person, place, and time.  Skin:  Skin is warm and dry.  Psychiatric: She has a normal mood and affect. Her behavior is normal.    Ortho Exam ankle range of motion symmetrical.  Negative anterior drawer.  Good peroneal strength.  Well-healed lateral ankle reconstruction.  Peroneus longus and brevis take good resisted strength testing.  She is amatory without a limp.  Specialty Comments:  No specialty comments available.  Imaging: No results found.   PMFS History: Patient Active Problem List   Diagnosis Date Noted  . 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 Vitamin D deficiency 07/04/2015  . GAD (generalized anxiety disorder) 06/30/2015  . Mood disorder ([place]) 03/28/2015  . BMI 40.0-44.9, adult ([place]) 12/21/2014  . Rh negative, antepartum 06/03/2013   Past Medical History:  Diagnosis Date  . Ankle instability, right   . Anxiety   . Bipolar disorder ([place])   . Complication of anesthesia   . Family history of adverse reaction to anesthesia    Dad had n/v post anesthesia  . GERD (gastroesophageal reflux disease)   . Medical history non-contributory   . Miscarriage   . PONV (postoperative nausea and vomiting)     Family History  Problem Relation Age of Onset  . Kidney disease Father   . COPD Father   . Hypertension Father   . Kidney disease Sister   .  Asthma Sister   . Early death Mother   . Stroke Mother   . Heart disease Mother   . Asthma Brother     Past Surgical History:  Procedure Laterality Date  . ANKLE RECONSTRUCTION Right 02/21/2017   Procedure: RIGHT LATERAL ANKLE RECONSTRUCTION WITH SPLIT PERONEAL LONGUS TENDON;  Surgeon: Marybelle Killings, MD;  Location: [facility];  Service: Orthopedics;  Laterality: Right;  . NO PAST SURGERIES    . WISDOM TOOTH EXTRACTION  04/12/2015   Social History   Occupational History  . Not on file  Tobacco Use  . Smoking status: Never Smoker  . Smokeless tobacco: Never Used  Substance and Sexual Activity  . Alcohol use: No  . Drug  use: No  . Sexual activity: Yes    Birth control/protection: IUD

## 2017-06-14 ENCOUNTER — Ambulatory Visit: Payer: Medicaid Other | Admitting: Family

## 2017-06-14 ENCOUNTER — Encounter: Payer: Self-pay | Admitting: Family

## 2017-06-14 VITALS — BP 125/89 | HR 80 | Temp 98.2°F | Ht 64.0 in | Wt 303.0 lb

## 2017-06-14 DIAGNOSIS — Z6841 Body Mass Index (BMI) 40.0 and over, adult: Secondary | ICD-10-CM

## 2017-06-14 DIAGNOSIS — R609 Edema, unspecified: Secondary | ICD-10-CM | POA: Diagnosis not present

## 2017-06-14 NOTE — Progress Notes (Signed)
   Subjective:    Patient ID: Kelly Fry, female    DOB: [birth year], 32 y.o.   MRN: [id number]  Chief Complaint  Patient presents with  . swelling in feet    HPI Pt presents to the office today with swelling in bilateral ankles and feet. PT reports she had reconstruction surgery to her right lateral ankle on 02/21/17. She reports she has intermittent swelling of that right ankle and foot since her surgery. However, over the last three days she has noticed swelling in her left foot.   States the swelling is worsen at the end of the day. She reports the swelling  improve in the morning if she elevates her feet all night.   States she feels like she is walking on needles . States this pain comes and goes and is worse when she walks.   Review of Systems  Cardiovascular: Positive for leg swelling.  All other systems reviewed and are negative.      Objective:   Physical Exam  Constitutional: She is oriented to person, place, and time. She appears well-developed and well-nourished. No distress.  Cardiovascular: Normal rate, regular rhythm, normal heart sounds and intact distal pulses.  No murmur heard. Pulmonary/Chest: Effort normal and breath sounds normal. No respiratory distress. She has no wheezes.  Abdominal: Soft. Bowel sounds are normal. She exhibits no distension. There is no tenderness.  Musculoskeletal: Normal range of motion. She exhibits edema (trace in right ankle). She exhibits no tenderness.  Negative homan's sign  Neurological: She is alert and oriented to person, place, and time. She has normal reflexes. No cranial nerve deficit.  Skin: Skin is warm and dry.  Psychiatric: She has a normal mood and affect. Her behavior is normal. Judgment and thought content normal.  Vitals reviewed.    BP 125/89   Pulse 80   Temp 98.2 F (36.8 C) (Oral)   Ht _0  (1.626 m)   Wt (!) 303 lb (137.4 kg)   BMI 52.01 kg/m      Assessment & Plan:  Aamilah was seen today for  swelling in feet.  Diagnoses and all orders for this visit:  Peripheral edema -     BMP8+EGFR -     Compression stockings  Morbid obesity with BMI of 45.0-49.9, adult ([place]) -     BMP8+EGFR    Labs pending Strict low salt diet  Discussed trying lose weight, discuss MyF[employer]ness Pal to monitor salt and calories  Keep feet elevated Keep follow up with Ortho RTO prn or if symptoms worsen   Evelina Dun, FNP

## 2017-06-14 NOTE — Patient Instructions (Signed)

## 2017-06-15 LAB — BMP8+EGFR
BUN/Creatinine Ratio: 15 (ref 9–23)
BUN: 12 mg/dL (ref 6–20)
CALCIUM: 9.2 mg/dL (ref 8.7–10.2)
CO2: 21 mmol/L (ref 20–29)
Chloride: 103 mmol/L (ref 96–106)
Creatinine, Ser: 0.79 mg/dL (ref 0.57–1.00)
GFR, EST AFRICAN AMERICAN: 116 mL/min/{1.73_m2} (ref >59)
GFR, EST NON AFRICAN AMERICAN: 101 mL/min/{1.73_m2} (ref >59)
Glucose: 76 mg/dL (ref 65–99)
Potassium: 4.4 mmol/L (ref 3.5–5.2)
Sodium: 140 mmol/L (ref 134–144)

## 2017-06-24 ENCOUNTER — Ambulatory Visit: Payer: Medicaid Other | Admitting: Family Medicine

## 2017-06-24 ENCOUNTER — Encounter: Payer: Self-pay | Admitting: Family Medicine

## 2017-06-24 VITALS — BP 138/94 | HR 94 | Temp 97.4°F | Ht 64.0 in | Wt 301.6 lb

## 2017-06-24 DIAGNOSIS — Z3009 Encounter for other general counseling and advice on contraception: Secondary | ICD-10-CM | POA: Diagnosis not present

## 2017-06-24 DIAGNOSIS — Z6841 Body Mass Index (BMI) 40.0 and over, adult: Secondary | ICD-10-CM

## 2017-06-24 DIAGNOSIS — R030 Elevated blood-pressure reading, without diagnosis of hypertension: Secondary | ICD-10-CM | POA: Diagnosis not present

## 2017-06-24 NOTE — Patient Instructions (Addendum)
Great to see you!  Come back in 2-3 weeks for nexplanon placement.

## 2017-06-24 NOTE — Progress Notes (Signed)
   HPI  Patient presents today for discussion about contraception.  Patient no longer wants to take the OCPs. She does not want to do Depo Provera. She did not have a good experience with previous IUD, she is not ready for tubal. She is interested in [place].  She currently has 3-day light periods about every 4 weeks.  She is also concerned about weight gain.  She states that she is become much more active after her ankle surgery and is watching her diet closely.  However she continues to gain weight.  Today her BP is elevated, no headache or chest pain,  PMH: Smoking status noted ROS: Per HPI  Objective: BP (!) 138/94   Pulse 94   Temp (!) 97.4 F (36.3 C)   Ht 5\' 4"  (1.626 m)   Wt (!) 301 lb 9.6 oz (136.8 kg)   LMP 06/10/2017   BMI 51.77 kg/m  Gen: NAD, alert, cooperative with exam HEENT: NCAT CV: RRR, good S1/S2, no murmur Resp: CTABL, no wheezes, non-labored Ext: No edema, warm Neuro: Alert and oriented, No gross deficits  Assessment and plan:  #General counseling for contraception She will return for Nexplanon placement  #Elevated blood pressure Continue monitoring, usually her blood pressure is well controlled She feels anxious today  #Morbid obesity With weight gain lately Checking TSH today Refer to weight management clinic, she is very motivated    Orders Placed This Encounter  Procedures  . Thyroid Panel With TSH  . LDL Cholesterol, Direct  . Amb Referral to Bariatric Surgery    Referral Priority:   Routine    Referral Type:   Consultation    Referred to Provider:   Starlyn Skeans, MD    Number of Visits Requested:   [place], MD [place] 06/24/2017, 5:03 PM

## 2017-06-25 LAB — LDL CHOLESTEROL, DIRECT: LDL DIRECT: 118 mg/dL — AB (ref 0–99)

## 2017-06-25 LAB — THYROID PANEL WITH TSH
Free Thyroxine Index: 1.8 (ref 1.2–4.9)
T3 Uptake Ratio: 30 % (ref 24–39)
T4, Total: 6.1 ug/dL (ref 4.5–12.0)
TSH: 2.88 u[IU]/mL (ref 0.450–4.500)

## 2017-07-15 ENCOUNTER — Encounter: Payer: Self-pay | Admitting: Family Medicine

## 2017-07-15 ENCOUNTER — Ambulatory Visit: Payer: Medicaid Other | Admitting: Family Medicine

## 2017-07-15 VITALS — BP 138/94 | HR 81 | Temp 98.3°F | Ht 64.0 in | Wt 300.0 lb

## 2017-07-15 DIAGNOSIS — Z30017 Encounter for initial prescription of implantable subdermal contraceptive: Secondary | ICD-10-CM | POA: Diagnosis not present

## 2017-07-15 DIAGNOSIS — Z3009 Encounter for other general counseling and advice on contraception: Secondary | ICD-10-CM

## 2017-07-15 DIAGNOSIS — M25473 Effusion, unspecified ankle: Secondary | ICD-10-CM

## 2017-07-15 LAB — PREGNANCY, URINE: Preg Test, Ur: NEGATIVE

## 2017-07-15 MED ORDER — ETONOGESTREL 68 MG ~~LOC~~ IMPL
68.0000 mg | DRUG_IMPLANT | Freq: Once | SUBCUTANEOUS | Status: AC
  Administered 2017-07-15: 68 mg via SUBCUTANEOUS

## 2017-07-15 MED ORDER — HYDROCHLOROTHIAZIDE 12.5 MG PO CAPS: 12.5000 mg | ORAL_CAPSULE | Freq: Every day | ORAL | 3 refills | Status: DC

## 2017-07-15 NOTE — Addendum Note (Signed)
Addended by: Nigel Berthold C on: 07/15/2017 11:48 AM   Modules accepted: Orders

## 2017-07-15 NOTE — Patient Instructions (Signed)
Great to see you!  Try out HCTZ for a week or two to see how the ankle swelling is, come back in 1-2 months for follow up  Keep the pressure bandage in place for 12-24 hours, Call with any concerns  Bruising is normal but should not be severe.

## 2017-07-15 NOTE — Progress Notes (Signed)
   HPI  Patient presents today here for nexplanon insertion, also with ankle swelling  Mild to mod ankle swelling for about 5-6 months, waxes and wanes  Has cut back on salt Initially on R ankle only after surgery  Discussed risks and benefits of nexplanon, she would like to proceed  PMH: Smoking status noted ROS: Per HPI  Objective: BP (!) 138/94   Pulse 81   Temp 98.3 F (36.8 C) (Oral)   Ht 5\' 4"  (1.626 m)   Wt 300 lb (136.1 kg)   LMP 07/14/2017   BMI 51.49 kg/m  Gen: NAD, alert, cooperative with exam HEENT: NCAT CV: RRR, good S1/S2, no murmur Resp: CTABL, no wheezes, non-labored Ext: No edema, warm Neuro: Alert and oriented, No gross deficits  Nexplanon insertion  Patient given informed consent, signed copy in the chart, time out was performed. Pregnancy test was negative.    The ruler used to measure and mark insertion area, 8 cm from medial epicondyle.  Pt was prepped with alcohol swab and then injected with 2 cc of 1% lidocaine with epinephrine.  Pt was prepped with betadine, nexplanon removed form packaging,  Device confirmed in needle, then inserted full length of needle and withdrawn per handbook instructions.  Pt insertion site covered with 1 steryi strip, gauze, and coban.   Pt tolerated the procedure well.    Assessment and plan:  # contraception counseling, nexplanon insertion Nexplanon inserted as above after negative UPT, Currently menstrating  # ankle swelling Mild, recurrent Cutting back on salt Low dose HCTZ sent   Orders Placed This Encounter  Procedures  . Pregnancy, urine    Meds ordered this encounter  Medications  . hydrochlorothiazide (MICROZIDE) 12.5 MG capsule    Sig: Take 1 capsule (12.5 mg total) by mouth daily.    Dispense:  30 capsule    Refill:  [place], MD [place] 07/15/2017, 10:49 AM

## 2017-08-28 ENCOUNTER — Ambulatory Visit (INDEPENDENT_AMBULATORY_CARE_PROVIDER_SITE_OTHER): Payer: Medicaid Other | Admitting: Family Medicine

## 2017-08-28 ENCOUNTER — Encounter (INDEPENDENT_AMBULATORY_CARE_PROVIDER_SITE_OTHER): Payer: Medicaid Other

## 2017-08-28 ENCOUNTER — Encounter: Payer: Self-pay | Admitting: Family Medicine

## 2017-08-28 VITALS — BP 128/86 | HR 80 | Temp 98.1°F | Ht 64.0 in | Wt 299.4 lb

## 2017-08-28 DIAGNOSIS — M25473 Effusion, unspecified ankle: Secondary | ICD-10-CM

## 2017-08-28 LAB — BMP8+EGFR
BUN/Creatinine Ratio: 9 (ref 9–23)
BUN: 8 mg/dL (ref 6–20)
CALCIUM: 9.5 mg/dL (ref 8.7–10.2)
CHLORIDE: 106 mmol/L (ref 96–106)
CO2: 22 mmol/L (ref 20–29)
Creatinine, Ser: 0.94 mg/dL (ref 0.57–1.00)
GFR calc Af Amer: 94 mL/min/{1.73_m2} (ref >59)
GFR, EST NON AFRICAN AMERICAN: 82 mL/min/{1.73_m2} (ref >59)
GLUCOSE: 83 mg/dL (ref 65–99)
POTASSIUM: 4.7 mmol/L (ref 3.5–5.2)
SODIUM: 142 mmol/L (ref 134–144)

## 2017-08-28 NOTE — Patient Instructions (Signed)
Great to see you!  Come see Dr. Evette Doffing, Particia Nearing, or Dr. Lajuana Ripple in 6 months

## 2017-08-28 NOTE — Progress Notes (Signed)
HPI  Patient presents today here to discuss ankle swelling.  Last visit she was started on HCTZ for ankle swelling.  She also had a Nexplanon implanted.  She is doing well with .  She states that she was doing very well with HCTZ with leg and ankle swelling, however had a little bit of rebound over the last week.  She is tolerating medication well would like to continue. She is interested in weight loss and is seeing Dr. Beasley shortly.  PMH: Smoking status noted ROS: Per HPI  Objective: BP 128/86   Pulse 80   Temp 98.1 F (36.7 C) (Oral)   Ht 5' 4" (1.626 m)   Wt 299 lb 6.4 oz (135.8 kg)   BMI 51.39 kg/m  Gen: NAD, alert, cooperative with exam HEENT: NCAT CV: RRR, good S1/S2, no murmur Resp: CTABL, no wheezes, non-labored Ext: No edema, warm Neuro: Alert and oriented, No gross deficits  Assessment and plan:  #Ankle swelling Improved slightly with HCTZ, repeat BMP  No changes Follow-up 6 months with new PCP    Orders Placed This Encounter  Procedures  . BMP8+EGFR     Sam , MD [facility] 08/28/2017, 9:21 AM     

## 2017-09-01 ENCOUNTER — Encounter (INDEPENDENT_AMBULATORY_CARE_PROVIDER_SITE_OTHER): Payer: Self-pay | Admitting: Family Medicine

## 2017-09-02 ENCOUNTER — Encounter (INDEPENDENT_AMBULATORY_CARE_PROVIDER_SITE_OTHER): Payer: Self-pay | Admitting: Family Medicine

## 2017-09-02 ENCOUNTER — Ambulatory Visit (INDEPENDENT_AMBULATORY_CARE_PROVIDER_SITE_OTHER): Payer: Medicaid Other | Admitting: Family Medicine

## 2017-09-02 VITALS — BP 133/92 | HR 87 | Temp 98.3°F | Ht 64.0 in | Wt 293.0 lb

## 2017-09-02 DIAGNOSIS — Z6841 Body Mass Index (BMI) 40.0 and over, adult: Secondary | ICD-10-CM

## 2017-09-02 DIAGNOSIS — R0602 Shortness of breath: Secondary | ICD-10-CM

## 2017-09-02 DIAGNOSIS — Z0289 Encounter for other administrative examinations: Secondary | ICD-10-CM

## 2017-09-02 DIAGNOSIS — R5383 Other fatigue: Secondary | ICD-10-CM | POA: Diagnosis not present

## 2017-09-02 DIAGNOSIS — E162 Hypoglycemia, unspecified: Secondary | ICD-10-CM

## 2017-09-02 DIAGNOSIS — E559 Vitamin D deficiency, unspecified: Secondary | ICD-10-CM

## 2017-09-02 DIAGNOSIS — Z1331 Encounter for screening for depression: Secondary | ICD-10-CM | POA: Diagnosis not present

## 2017-09-02 NOTE — Progress Notes (Unsigned)
Office: [phone]  /  Fax: [phone]  Date: September 16, 2017 Time Seen:*** Duration:*** Provider: Glennie Isle, PsyD Type of Session: Intake for Individual Therapy   Informed Consent:The provider's role was explained to Kelly Fry. The provider discussed issues of confidentiality, privacy, and limits therein; anticipated course of treatment; potential risks involved with psychotherapy; the voluntary nature of treatment; and the clinic's cancellation policy. In addition to written consent, verbal informed consent for psychological services was obtained from Kelly Fry prior to the initial intake interview.   Kelly Fry was informed that information about mental health appointments will be entered in the medical record at [facility]) via Epic. Moreover, Kelly Fry agreed information may be shared with other [facility] providers as needed for coordination of care. Written consent was also provided for this provider to coordinate care with other providers at [facility].The provider further explained leaving voicemail messages and sending messages via MyChart can be utilized for non-emergency reasons, and limits of confidentiality related to communication via technology was discussed. Furthermore, Kelly Fry was informed the clinic is not a 24/7 crisis center and mental health emergency resources were shared. Kelly Fry was given a handout with emergency resources. Kelly Fry verbally acknowledged understanding, and agreed to use mental health emergency resources discussed if needed.   Chief Complaint: Kelly Fry was referred by Dr. Dennard Nip. Per the note for the initial visit with Dr. Leafy Ro on September 02, 2017, Kelly Fry reported the following: significant food cravings issues; snacks frequently in the evenings; skips meals frequently; frequently drinks liquids with calories; frequently makes poor food choices; frequently eats larger portions than normal; and  struggles with emotional eating.   Kelly Fry was asked to complete a questionnaire assessing various behaviors related to emotional eating. Kelly Fry endorsed the following: *** Overeat when you are celebrating Experience food cravings on a regular basis Eat certain foods when you are anxious, stressed, depressed, or your feelings are hurt Use food to help you cope with emotional situations Find food is comforting to you Overeat when you are angry or upset Overeat when you are worried about something Overeat frequently when you are bored or lonely Not worry about what you eat when you are in a good mood Overeat when you are angry at someone just to show them they cannot control you Overeat when you are alone, but eat much less when you are with other people Eat to help you stay awake Eat as a reward  HPI: Per the note for the initial visit with Dr. Leafy Ro on September 02, 2017, Kelly Fry started gaining weight in 2008 and her heaviest weight ever was 301 pounds.   Mental Status Examination: Kelly Fry arrived on time for the appointment. She presented as appropriately dressed and groomed. Kelly Fry appeared her stated age and demonstrated adequate orientation to time, place, person, and purpose of the appointment. She also demonstrated appropriate eye contact. No psychomotor abnormalities or behavioral peculiarities noted. Her mood was *** with congruent affect. Her thought processes were logical, linear, and goal-directed. No hallucinations, delusions, bizarre thinking or behavior reported or observed. Judgment, insight, and impulse control appeared to be grossly intact. There was no evidence of paraphasias (i.e., errors in speech, gross mispronunciations, and word substitutions), repetition deficits, or disturbances in volume or prosody (i.e., rhythm and intonation). There was no evidence of attention or memory impairments. Kelly Fry denied current suicidal and homicidal ideation, plan, and intent.   The  Mini-Mental State Examination, Second Edition (MMSE-2) was administered. The MMSE-2 briefly  screens for cognitive dysfunction and overall mental status and assesses different cognitive domains: orientation, registration, attention and calculation, recall, and language and praxis. Kelly Fry received *** out of 30 points possible on the MMSE-2, which is noted in the *** range. The following points were lost: ***  Family & Psychosocial History: ***  Medical History: ***  Mental Health History: ***  Structured Assessment Results: The Patient Health Questionnaire-9 (PHQ-9) is a self-report measure that assesses symptoms and severity of depression over the course of the last two weeks. Kelly Fry obtained a score of *** suggesting *** depression. Kelly Fry finds the endorsed symptoms to be ***.    The Generalized Anxiety Disorder-7 (GAD-7) is a brief self-report measure that assesses symptoms of anxiety over the course of the last two weeks. Kelly Fry obtained a score of *** suggesting *** anxiety.    Interventions: A chart review was conducted prior to the clinical intake interview. The MMSE-2, PHQ-9, and GAD-7 were administered and a clinical intake interview was completed. In addition, Kelly Fry was asked to complete a Mood and Food questionnaire to assess various behaviors related to emotional eating. Throughout session, empathic reflections and validation was provided. Continuing treatment with this provider was discussed and treatment goals were established.  Provisional DSM-5 Diagnosis:***  Plan: Kelly Fry expressed understanding and agreement with the initial treatment plan of care. She appears able and willing to participate as evidenced by collaboration on treatment goals, engagement in reciprocal conversation, and asking questions as needed for clarification. The next appointment will be scheduled in *** week(s). The following treatment goals were established: ***.

## 2017-09-03 ENCOUNTER — Encounter (INDEPENDENT_AMBULATORY_CARE_PROVIDER_SITE_OTHER): Payer: Self-pay | Admitting: Family Medicine

## 2017-09-03 LAB — CBC WITH DIFFERENTIAL
BASOS: 0 %
Basophils Absolute: 0 10*3/uL (ref 0.0–0.2)
EOS (ABSOLUTE): 0.1 10*3/uL (ref 0.0–0.4)
Eos: 1 %
HEMATOCRIT: 45.5 % (ref 34.0–46.6)
Hemoglobin: 15.3 g/dL (ref 11.1–15.9)
IMMATURE GRANULOCYTES: 0 %
Immature Grans (Abs): 0 10*3/uL (ref 0.0–0.1)
LYMPHS: 20 %
Lymphocytes Absolute: 1.5 10*3/uL (ref 0.7–3.1)
MCH: 29.5 pg (ref 26.6–33.0)
MCHC: 33.6 g/dL (ref 31.5–35.7)
MCV: 88 fL (ref 79–97)
MONOS ABS: 0.4 10*3/uL (ref 0.1–0.9)
Monocytes: 6 %
Neutrophils Absolute: 5.3 10*3/uL (ref 1.4–7.0)
Neutrophils: 73 %
RBC: 5.18 x10E6/uL (ref 3.77–5.28)
RDW: 13.3 % (ref 12.3–15.4)
WBC: 7.3 10*3/uL (ref 3.4–10.8)

## 2017-09-03 LAB — VITAMIN D 25 HYDROXY (VIT D DEFICIENCY, FRACTURES): VIT D 25 HYDROXY: 28.7 ng/mL — AB (ref 30.0–100.0)

## 2017-09-03 LAB — T3: T3, Total: 135 ng/dL (ref 71–180)

## 2017-09-03 LAB — COMPREHENSIVE METABOLIC PANEL
A/G RATIO: 1.7 (ref 1.2–2.2)
ALT: 26 IU/L (ref 0–32)
AST: 19 IU/L (ref 0–40)
Albumin: 4.7 g/dL (ref 3.5–5.5)
Alkaline Phosphatase: 101 IU/L (ref 39–117)
BUN/Creatinine Ratio: 11 (ref 9–23)
BUN: 10 mg/dL (ref 6–20)
Bilirubin Total: 0.3 mg/dL (ref 0.0–1.2)
CALCIUM: 9.9 mg/dL (ref 8.7–10.2)
CO2: 18 mmol/L — ABNORMAL LOW (ref 20–29)
CREATININE: 0.94 mg/dL (ref 0.57–1.00)
Chloride: 104 mmol/L (ref 96–106)
GFR calc Af Amer: 94 mL/min/{1.73_m2} (ref >59)
GFR, EST NON AFRICAN AMERICAN: 82 mL/min/{1.73_m2} (ref >59)
Globulin, Total: 2.8 g/dL (ref 1.5–4.5)
Glucose: 89 mg/dL (ref 65–99)
POTASSIUM: 4.1 mmol/L (ref 3.5–5.2)
Sodium: 140 mmol/L (ref 134–144)
TOTAL PROTEIN: 7.5 g/dL (ref 6.0–8.5)

## 2017-09-03 LAB — T4, FREE: Free T4: 1.31 ng/dL (ref 0.82–1.77)

## 2017-09-03 LAB — VITAMIN B12: VITAMIN B 12: 351 pg/mL (ref 232–1245)

## 2017-09-03 LAB — LIPID PANEL WITH LDL/HDL RATIO
CHOLESTEROL TOTAL: 171 mg/dL (ref 100–199)
HDL: 45 mg/dL (ref >39)
LDL Calculated: 111 mg/dL — ABNORMAL HIGH (ref 0–99)
LDL/HDL RATIO: 2.5 ratio (ref 0.0–3.2)
TRIGLYCERIDES: 73 mg/dL (ref 0–149)
VLDL Cholesterol Cal: 15 mg/dL (ref 5–40)

## 2017-09-03 LAB — INSULIN, RANDOM: INSULIN: 42.7 u[IU]/mL — ABNORMAL HIGH (ref 2.6–24.9)

## 2017-09-03 LAB — FOLATE: Folate: 12.4 ng/mL (ref >3.0)

## 2017-09-03 LAB — HEMOGLOBIN A1C
ESTIMATED AVERAGE GLUCOSE: 103 mg/dL
Hgb A1c MFr Bld: 5.2 % (ref 4.8–5.6)

## 2017-09-03 LAB — TSH: TSH: 2.76 u[IU]/mL (ref 0.450–4.500)

## 2017-09-03 NOTE — Progress Notes (Signed)
Office: [phone]  /  Fax: [phone]   Dear Dr. Wendi Snipes,   Thank you for referring [name] to our clinic. The following note includes my evaluation and treatment recommendations.  HPI:   Chief Complaint: OBESITY    [name] has been referred by Mikeal Hawthorne L. Wendi Snipes, MD for consultation regarding her obesity and obesity related comorbidities.    Kelly Fry (MR# [id number]) is a 32 y.o. female who presents on 09/02/2017 for obesity evaluation and treatment. Current BMI is Body mass index is 50.29 kg/m.Kelly Fry Kelly Fry has been struggling with her weight for many years and has been unsuccessful in either losing weight, maintaining weight loss, or reaching her healthy weight goal.     Kelly Fry attended our information session and states she is currently in the action stage of change and ready to dedicate time achieving and maintaining a healthier weight. Kelly Fry is interested in becoming our patient and working on intensive lifestyle modifications including (but not limited to) diet, exercise and weight loss.    [name] states her family eats meals together she thinks her family will eat healthier with  her she struggles with family and or coworkers weight loss sabotage her desired weight loss is 143 lbs she started gaining weight in 2008 her heaviest weight ever was 301 lbs she has significant food cravings issues  she snacks frequently in the evenings she skips meals frequently she is frequently drinking liquids with calories she frequently makes poor food choices she frequently eats larger portions than normal  she struggles with emotional eating    Fatigue [name] feels her energy is lower than it should be. This has worsened with weight gain and has not worsened recently. Tiphany admits to daytime somnolence and  admits to waking up still tired. Patient is at risk for obstructive sleep apnea. Patent has a history of symptoms of daytime fatigue. Patient generally gets 9  hours of sleep per night, and states they generally have nightime awakenings. Snoring is present. Apneic episodes are present. Epworth Sleepiness Score is 13.  Dyspnea on exertion Kelly Fry notes increasing shortness of breath with exercising and seems to be worsening over time with weight gain. She notes getting out of breath sooner with activity than she used to. This has not gotten worse recently. [name] denies orthopnea.  Vitamin D Deficiency Kelly Fry has a diagnosis of vitamin D deficiency. She is not on Vit D, she notes fatigue and denies nausea, vomiting or muscle weakness. No recent labs.  Hypoglycemia Kelly Fry has a history of polyphagia in the evening but decreased appetite every morning suggesting a level of insulin resistance. She notes acanthosis nigricans.  Depression Screen Kelly Fry's Food and Mood (modified PHQ-9) score was  Depression screen PHQ 2/9 09/02/2017  Decreased Interest 3  Down, Depressed, Hopeless 3  PHQ - 2 Score 6  Altered sleeping 3  Tired, decreased energy 3  Change in appetite 3  Feeling bad or failure about yourself  3  Trouble concentrating 3  Moving slowly or fidgety/restless 3  Suicidal thoughts 1  PHQ-9 Score 25  Difficult doing work/chores Very difficult  Some recent data might be hidden    ALLERGIES: Allergies  Allergen Reactions  . Latuda [Lurasidone] Other (See Comments)    Shaky; worsened mood    MEDICATIONS: Current Outpatient Medications on File Prior to Visit  Medication Sig Dispense Refill  . albuterol (PROVENTIL HFA;VENTOLIN HFA) 108 (90 Base) MCG/ACT inhaler Inhale 2 puffs into the lungs every 6 (six) hours as needed  for wheezing or shortness of breath. 1 Inhaler 2  . hydrochlorothiazide (MICROZIDE) 12.5 MG capsule Take 1 capsule (12.5 mg total) by mouth daily. 30 capsule 3   No current facility-administered medications on file prior to visit.     PAST MEDICAL HISTORY: Past Medical History:  Diagnosis Date  . Ankle instability,  right   . Anxiety   . Back pain   . Bipolar disorder ([place])   . Complication of anesthesia   . Depression   . Dyspnea   . Family history of adverse reaction to anesthesia    Dad had n/v post anesthesia  . GERD (gastroesophageal reflux disease)   . Leg edema   . Medical history non-contributory   . Miscarriage   . PONV (postoperative nausea and vomiting)   . Vitamin D deficiency     PAST SURGICAL HISTORY: Past Surgical History:  Procedure Laterality Date  . ANKLE RECONSTRUCTION Right 02/21/2017   Procedure: RIGHT LATERAL ANKLE RECONSTRUCTION WITH SPLIT PERONEAL LONGUS TENDON;  Surgeon: Marybelle Killings, MD;  Location: [facility];  Service: Orthopedics;  Laterality: Right;  . NO PAST SURGERIES    . WISDOM TOOTH EXTRACTION  04/12/2015    SOCIAL HISTORY: Social History   Tobacco Use  . Smoking status: Never Smoker  . Smokeless tobacco: Never Used  Substance Use Topics  . Alcohol use: No  . Drug use: No    FAMILY HISTORY: Family History  Problem Relation Age of Onset  . Kidney disease Father   . COPD Father   . Hypertension Father   . Depression Father   . Sleep apnea Father   . Obesity Father   . Kidney disease Sister   . Asthma Sister   . Early death Mother   . Stroke Mother   . Heart disease Mother   . Hypertension Mother   . Alcoholism Mother   . Asthma Brother     ROS: Review of Systems  Constitutional: Positive for malaise/fatigue. Negative for weight loss.       + Trouble sleeping  Eyes:       + Wear glasses or contacts  Respiratory: Positive for shortness of breath (with exertion).   Cardiovascular: Negative for orthopnea.       + Difficulty breathing while lying down + Calf/leg pain with walking + Very cold feet or hands  Gastrointestinal: Positive for heartburn. Negative for nausea and vomiting.  Musculoskeletal: Positive for back pain and neck pain.       Negative muscle weakness + Neck stiffness + Muscle or joint pain  Neurological: Positive for  weakness.  Endo/Heme/Allergies: Positive for polydipsia. Bruises/bleeds easily.       Positive hypoglycemia Positive polyphagia  Psychiatric/Behavioral: Positive for depression. Negative for suicidal ideas. The patient is nervous/anxious.        + Stress    PHYSICAL EXAM: Blood pressure (!) 133/92, pulse 87, temperature 98.3 F (36.8 C), temperature source Oral, height 5\' 4"  (1.626 m), weight 293 lb (132.9 kg), last menstrual period 07/14/2017, SpO2 99 %. Body mass index is 50.29 kg/m. Physical Exam  Constitutional: She is oriented to person, place, and time. She appears well-developed and well-nourished.  HENT:  Head: Normocephalic and atraumatic.  Nose: Nose normal.  Eyes: EOM are normal. No scleral icterus.  Neck: Normal range of motion. Neck supple. No thyromegaly present.  + Acanthosis nigricans  Cardiovascular: Normal rate and regular rhythm.  Pulmonary/Chest: Effort normal. No respiratory distress.  Abdominal: Soft. There is no tenderness.  +  Obesity  Musculoskeletal:  Range of Motion normal in all 4 extremities Trace edema noted in bilateral lower extremities  Neurological: She is alert and oriented to person, place, and time. Coordination normal.  Skin: Skin is warm and dry.  Psychiatric: She has a normal mood and affect. Her behavior is normal.  Vitals reviewed.   RECENT LABS AND TESTS: BMET    Component Value Date/Time   NA 140 09/02/2017 0942   K 4.1 09/02/2017 0942   CL 104 09/02/2017 0942   CO2 18 (L) 09/02/2017 0942   GLUCOSE 89 09/02/2017 0942   GLUCOSE 91 02/19/2017 0935   GLUCOSE 72 08/30/2013 1342   BUN 10 09/02/2017 0942   CREATININE 0.94 09/02/2017 0942   CREATININE 0.73 11/09/2013 1509   CALCIUM 9.9 09/02/2017 0942   GFRNONAA 82 09/02/2017 0942   GFRAA 94 09/02/2017 0942   Lab Results  Component Value Date   HGBA1C 5.2 09/02/2017   Lab Results  Component Value Date   INSULIN 42.7 (H) 09/02/2017   CBC    Component Value Date/Time    WBC 7.3 09/02/2017 0942   WBC 7.6 02/19/2017 0935   RBC 5.18 09/02/2017 0942   RBC 4.95 02/19/2017 0935   HGB 15.3 09/02/2017 0942   HCT 45.5 09/02/2017 0942   PLT 247 02/19/2017 0935   PLT 240 04/18/2016 0832   MCV 88 09/02/2017 0942   MCH 29.5 09/02/2017 0942   MCH 28.5 02/19/2017 0935   MCHC 33.6 09/02/2017 0942   MCHC 32.6 02/19/2017 0935   RDW 13.3 09/02/2017 0942   LYMPHSABS 1.5 09/02/2017 0942   MONOABS 0.4 06/01/2013 0956   EOSABS 0.1 09/02/2017 0942   BASOSABS 0.0 09/02/2017 0942   Iron/TIBC/Ferritin/ %Sat    Component Value Date/Time   IRON 101 06/30/2015 1007   TIBC 360 06/30/2015 1007   FERRITIN 56 06/30/2015 1007   IRONPCTSAT 28 06/30/2015 1007   Lipid Panel     Component Value Date/Time   CHOL 171 09/02/2017 0942   TRIG 73 09/02/2017 0942   HDL 45 09/02/2017 0942   CHOLHDL 3.5 06/30/2015 1007   LDLCALC 111 (H) 09/02/2017 0942   LDLDIRECT 118 (H) 06/24/2017 1633   Hepatic Function Panel     Component Value Date/Time   PROT 7.5 09/02/2017 0942   ALBUMIN 4.7 09/02/2017 0942   AST 19 09/02/2017 0942   ALT 26 09/02/2017 0942   ALKPHOS 101 09/02/2017 0942   BILITOT 0.3 09/02/2017 0942      Component Value Date/Time   TSH 2.760 09/02/2017 0942   TSH 2.880 06/24/2017 1633   TSH 2.740 06/30/2015 1007    ECG  shows NSR with a rate of 90 BPM INDIRECT CALORIMETER done today shows a VO2 of 220 and a REE of 1530.  Her calculated basal metabolic rate is 9371 thus her basal metabolic rate is worse than expected.    ASSESSMENT AND PLAN: Other fatigue - Plan: EKG 12-Lead, Vitamin B12, CBC With Differential, Folate, Lipid Panel With LDL/HDL Ratio, T3, T4, free, TSH  Shortness of breath on exertion - Plan: CBC With Differential  Vitamin D deficiency - Plan: VITAMIN D 25 Hydroxy (Vit-D Deficiency, Fractures)  Hypoglycemia - Plan: Comprehensive metabolic panel, Hemoglobin A1c, Insulin, random  Depression screening  Class 3 severe obesity with serious  comorbidity and body mass index (BMI) of 45.0 to 49.9 in adult, unspecified obesity type (HCC)  PLAN:  Fatigue Annaliesa was informed that her fatigue may be related to obesity, depression or  many other causes. Labs will be ordered, and in the meanwhile Coralynn has agreed to work on diet, exercise and weight loss to help with fatigue. Proper sleep hygiene was discussed including the need for 7-8 hours of quality sleep each night. A sleep study was not ordered based on symptoms and Epworth score.  Dyspnea on exertion Mersades's shortness of breath appears to be obesity related and exercise induced. She has agreed to work on weight loss and gradually increase exercise to treat her exercise induced shortness of breath. If Cumi follows our instructions and loses weight without improvement of her shortness of breath, we will plan to refer to pulmonology. We will monitor this condition regularly. Kiandria agrees to this plan.  Vitamin D Deficiency Ellieana was informed that low vitamin D levels contributes to fatigue and are associated with obesity, breast, and colon cancer. She will follow up for routine testing of vitamin D, at least 2-3 times per year. She was informed of the risk of over-replacement of vitamin D and agrees to not increase her dose unless she discusses this with [place] first. We will check labs and Mckaila agrees to follow up with myself and Dr. Mallie Mussel, our bariatric psychologist in 2 weeks.  Hypoglycemia We will check labs and Ainslee is to start low sugar diet. Chrisanna agrees to follow up with myself and Dr. Mallie Mussel, our bariatric psychologist in 2 weeks.  Depression Screen Giordana had a strongly positive depression screening. Depression is commonly associated with obesity and often results in emotional eating behaviors. We will monitor this closely and work on CBT to help improve the non-hunger eating patterns. Referral to Psychology may be required if no improvement is seen as she continues in our  clinic.  Obesity Delois is currently in the action stage of change and her goal is to continue with weight loss efforts. I recommend Darlene begin the structured treatment plan as follows:  She has agreed to follow the Category 2 plan Arneshia has been instructed to eventually work up to a goal of 150 minutes of combined cardio and strengthening exercise per week for weight loss and overall health benefits. We discussed the following Behavioral Modification Strategies today: increasing lean protein intake, decreasing simple carbohydrates , work on meal planning and easy cooking plans, dealing with family or coworker sabotage, and no skipping meals   She was informed of the importance of frequent follow up visits to maximize her success with intensive lifestyle modifications for her multiple health conditions. She was informed we would discuss her lab results at her next visit unless there is a critical issue that needs to be addressed sooner. Shawnelle agreed to keep her next visit at the agreed upon time to discuss these results.    OBESITY BEHAVIORAL INTERVENTION VISIT  Today's visit was # 1 out of 22.  Starting weight: 293 lbs Starting date: 09/02/17 Today's weight : 293 lbs Today's date: 09/02/2017 Total lbs lost to date: 0 (Patients must lose 7 lbs in the first 6 months to continue with counseling)   ASK: We discussed the diagnosis of obesity with [name] today and Kelly Fry agreed to give [place] permission to discuss obesity behavioral modification therapy today.  ASSESS: Doneen has the diagnosis of obesity and her BMI today is 50.27 Shaquaya is in the action stage of change   ADVISE: Karema was educated on the multiple health risks of obesity as well as the benefit of weight loss to improve her health. She was advised of the need  for long term treatment and the importance of lifestyle modifications.  AGREE: Multiple dietary modification options and treatment options were discussed  and  Tamecia agreed to the above obesity treatment plan.   I, Trixie Dredge, am acting as transcriptionist for Dennard Nip, MD  I have reviewed the above documentation for accuracy and completeness, and I agree with the above. -Dennard Nip, MD

## 2017-09-09 ENCOUNTER — Encounter (INDEPENDENT_AMBULATORY_CARE_PROVIDER_SITE_OTHER): Payer: Self-pay | Admitting: Family Medicine

## 2017-09-16 ENCOUNTER — Ambulatory Visit (INDEPENDENT_AMBULATORY_CARE_PROVIDER_SITE_OTHER): Payer: Self-pay | Admitting: Psychology

## 2017-09-16 ENCOUNTER — Ambulatory Visit (INDEPENDENT_AMBULATORY_CARE_PROVIDER_SITE_OTHER): Payer: Medicaid Other | Admitting: Family Medicine

## 2017-09-16 VITALS — BP 122/82 | HR 78 | Temp 98.5°F | Ht 64.0 in | Wt 288.0 lb

## 2017-09-16 DIAGNOSIS — E559 Vitamin D deficiency, unspecified: Secondary | ICD-10-CM

## 2017-09-16 DIAGNOSIS — Z6841 Body Mass Index (BMI) 40.0 and over, adult: Secondary | ICD-10-CM

## 2017-09-16 DIAGNOSIS — E8881 Metabolic syndrome: Secondary | ICD-10-CM

## 2017-09-16 MED ORDER — METFORMIN HCL 500 MG PO TABS: 500.0000 mg | ORAL_TABLET | Freq: Every day | ORAL | 0 refills | Status: DC

## 2017-09-16 MED ORDER — VITAMIN D (ERGOCALCIFEROL) 1.25 MG (50000 UNIT) PO CAPS: 50000.0000 [IU] | ORAL_CAPSULE | ORAL | 0 refills | Status: DC

## 2017-09-17 NOTE — Progress Notes (Signed)
Office: [phone]  /  Fax: [phone]   HPI:   Chief Complaint: OBESITY Kelly Fry is here to discuss her progress with her obesity treatment plan. She is on the  follow the Category 2 plan and is following her eating plan approximately 85 to 90 % of the time. She states she is doing yard work for exercise. Kelly Fry did well on her category 2 plan and polyphagia improved, but was still a challenge. The kids go back to school soon and she feels planning ahead will be easier. Her weight is 288 lb (130.6 kg) today and has had a weight loss of 5 pounds over a period of 2 weeks since her last visit. She has lost 5 lbs since starting treatment with [place].  Vitamin D deficiency (new) Kelly Fry has a new diagnosis of vitamin D deficiency. She is not on vit D or multi vitamin. Kelly Fry admits fatigue and denies nausea, vomiting or muscle weakness.  Insulin Resistance (new) Kelly Fry has a new diagnosis of insulin resistance based on her elevated fasting insulin level >5. Although Kelly Fry's blood glucose readings are still under good control, insulin resistance puts her at greater risk of metabolic syndrome and diabetes. She is not taking metformin currently and continues to work on diet and exercise to decrease risk of diabetes. Kelly Fry admits polyphagia.  ALLERGIES: Allergies  Allergen Reactions  . Latuda [Lurasidone] Other (See Comments)    Shaky; worsened mood    MEDICATIONS: Current Outpatient Medications on File Prior to Visit  Medication Sig Dispense Refill  . albuterol (PROVENTIL HFA;VENTOLIN HFA) 108 (90 Base) MCG/ACT inhaler Inhale 2 puffs into the lungs every 6 (six) hours as needed for wheezing or shortness of breath. 1 Inhaler 2  . hydrochlorothiazide (MICROZIDE) 12.5 MG capsule Take 1 capsule (12.5 mg total) by mouth daily. 30 capsule 3   No current facility-administered medications on file prior to visit.     PAST MEDICAL HISTORY: Past Medical History:  Diagnosis Date  . Ankle  instability, right   . Anxiety   . Back pain   . Bipolar disorder ([place])   . Complication of anesthesia   . Depression   . Dyspnea   . Family history of adverse reaction to anesthesia    Dad had n/v post anesthesia  . GERD (gastroesophageal reflux disease)   . Leg edema   . Medical history non-contributory   . Miscarriage   . PONV (postoperative nausea and vomiting)   . Vitamin D deficiency     PAST SURGICAL HISTORY: Past Surgical History:  Procedure Laterality Date  . ANKLE RECONSTRUCTION Right 02/21/2017   Procedure: RIGHT LATERAL ANKLE RECONSTRUCTION WITH SPLIT PERONEAL LONGUS TENDON;  Surgeon: Marybelle Killings, MD;  Location: [facility];  Service: Orthopedics;  Laterality: Right;  . NO PAST SURGERIES    . WISDOM TOOTH EXTRACTION  04/12/2015    SOCIAL HISTORY: Social History   Tobacco Use  . Smoking status: Never Smoker  . Smokeless tobacco: Never Used  Substance Use Topics  . Alcohol use: No  . Drug use: No    FAMILY HISTORY: Family History  Problem Relation Age of Onset  . Kidney disease Father   . COPD Father   . Hypertension Father   . Depression Father   . Sleep apnea Father   . Obesity Father   . Kidney disease Sister   . Asthma Sister   . Early death Mother   . Stroke Mother   . Heart disease Mother   . Hypertension  Mother   . Alcoholism Mother   . Asthma Brother     ROS: Review of Systems  Constitutional: Positive for malaise/fatigue and weight loss.  Gastrointestinal: Negative for nausea and vomiting.  Musculoskeletal:       Negative for muscle weakness  Endo/Heme/Allergies:       Positive for polyphagia    PHYSICAL EXAM: Blood pressure 122/82, pulse 78, temperature 98.5 F (36.9 C), temperature source Oral, height 5\' 4"  (1.626 m), weight 288 lb (130.6 kg), SpO2 96 %. Body mass index is 49.44 kg/m. Physical Exam  Constitutional: She is oriented to person, place, and time. She appears well-developed and well-nourished.  Cardiovascular: Normal  rate.  Pulmonary/Chest: Effort normal.  Musculoskeletal: Normal range of motion.  Neurological: She is oriented to person, place, and time.  Skin: Skin is warm and dry.  Psychiatric: She has a normal mood and affect. Her behavior is normal.  Vitals reviewed.   RECENT LABS AND TESTS: BMET    Component Value Date/Time   NA 140 09/02/2017 0942   K 4.1 09/02/2017 0942   CL 104 09/02/2017 0942   CO2 18 (L) 09/02/2017 0942   GLUCOSE 89 09/02/2017 0942   GLUCOSE 91 02/19/2017 0935   GLUCOSE 72 08/30/2013 1342   BUN 10 09/02/2017 0942   CREATININE 0.94 09/02/2017 0942   CREATININE 0.73 11/09/2013 1509   CALCIUM 9.9 09/02/2017 0942   GFRNONAA 82 09/02/2017 0942   GFRAA 94 09/02/2017 0942   Lab Results  Component Value Date   HGBA1C 5.2 09/02/2017   Lab Results  Component Value Date   INSULIN 42.7 (H) 09/02/2017   CBC    Component Value Date/Time   WBC 7.3 09/02/2017 0942   WBC 7.6 02/19/2017 0935   RBC 5.18 09/02/2017 0942   RBC 4.95 02/19/2017 0935   HGB 15.3 09/02/2017 0942   HCT 45.5 09/02/2017 0942   PLT 247 02/19/2017 0935   PLT 240 04/18/2016 0832   MCV 88 09/02/2017 0942   MCH 29.5 09/02/2017 0942   MCH 28.5 02/19/2017 0935   MCHC 33.6 09/02/2017 0942   MCHC 32.6 02/19/2017 0935   RDW 13.3 09/02/2017 0942   LYMPHSABS 1.5 09/02/2017 0942   MONOABS 0.4 06/01/2013 0956   EOSABS 0.1 09/02/2017 0942   BASOSABS 0.0 09/02/2017 0942   Iron/TIBC/Ferritin/ %Sat    Component Value Date/Time   IRON 101 06/30/2015 1007   TIBC 360 06/30/2015 1007   FERRITIN 56 06/30/2015 1007   IRONPCTSAT 28 06/30/2015 1007   Lipid Panel     Component Value Date/Time   CHOL 171 09/02/2017 0942   TRIG 73 09/02/2017 0942   HDL 45 09/02/2017 0942   CHOLHDL 3.5 06/30/2015 1007   LDLCALC 111 (H) 09/02/2017 0942   LDLDIRECT 118 (H) 06/24/2017 1633   Hepatic Function Panel     Component Value Date/Time   PROT 7.5 09/02/2017 0942   ALBUMIN 4.7 09/02/2017 0942   AST 19  09/02/2017 0942   ALT 26 09/02/2017 0942   ALKPHOS 101 09/02/2017 0942   BILITOT 0.3 09/02/2017 0942      Component Value Date/Time   TSH 2.760 09/02/2017 0942   TSH 2.880 06/24/2017 1633   TSH 2.740 06/30/2015 1007   Results for TYMARA, SAUR (MRN [id number]) as of 09/17/2017 11:01  Ref. Range 09/02/2017 09:42  Vitamin D, 25-Hydroxy Latest Ref Range: 30.0 - 100.0 ng/mL 28.7 (L)   ASSESSMENT AND PLAN: Vitamin D deficiency - Plan: Vitamin D, Ergocalciferol, (DRISDOL) 50000 units  CAPS capsule  Insulin resistance - Plan: metFORMIN (GLUCOPHAGE) 500 MG tablet  Class 3 severe obesity with serious comorbidity and body mass index (BMI) of 45.0 to 49.9 in adult, unspecified obesity type ([place])  PLAN:  Vitamin D Deficiency (new) Kelly Fry was informed that low vitamin D levels contributes to fatigue and are associated with obesity, breast, and colon cancer. She agrees to start prescription Vit D @50 ,000 IU every week #4 with no refills and will follow up for routine testing of vitamin D, at least 2-3 times per year. She was informed of the risk of over-replacement of vitamin D and agrees to not increase her dose unless she discusses this with [place] first. Kelly Fry agrees to follow up as directed.  Insulin Resistance (new) Kelly Fry will continue to work on weight loss, exercise, and decreasing simple carbohydrates in her diet to help decrease the risk of diabetes. We dicussed metformin including benefits and risks. She was informed that eating too many simple carbohydrates or too many calories at one sitting increases the likelihood of GI side effects. Kelly Fry agrees to start  Metformin 500 mg qAM #30 with no refills and follow up with [place] as directed to monitor her progress.  Obesity Kelly Fry is currently in the action stage of change. As such, her goal is to continue with weight loss efforts She has agreed to follow the Category 2 plan Kelly Fry has been instructed to work up to a goal of 150 minutes of  combined cardio and strengthening exercise per week for weight loss and overall health benefits. We discussed the following Behavioral Modification Strategies today: increasing lean protein intake, decreasing simple carbohydrates  and work on meal planning and easy cooking plans  Kelly Fry has agreed to follow up with our clinic in 2 weeks. She was informed of the importance of frequent follow up visits to maximize her success with intensive lifestyle modifications for her multiple health conditions.   OBESITY BEHAVIORAL INTERVENTION VISIT  Today's visit was # 2 out of 22.  Starting weight: 293 lbs Starting date: 09/02/17 Today's weight : 288 lbs  Today's date: 09/16/2017 Total lbs lost to date: 5    ASK: We discussed the diagnosis of obesity with Kelly Fry today and Kelly Fry agreed to give [place] permission to discuss obesity behavioral modification therapy today.  ASSESS: Kelly Fry has the diagnosis of obesity and her BMI today is 49.41 Kelly Fry is in the action stage of change   ADVISE: Kelly Fry was educated on the multiple health risks of obesity as well as the benefit of weight loss to improve her health. She was advised of the need for long term treatment and the importance of lifestyle modifications.  AGREE: Multiple dietary modification options and treatment options were discussed and  Kelly Fry agreed to the above obesity treatment plan.  I, Doreene Nest, am acting as transcriptionist for Dennard Nip, MD  I have reviewed the above documentation for accuracy and completeness, and I agree with the above. -Dennard Nip, MD

## 2017-09-30 ENCOUNTER — Ambulatory Visit (INDEPENDENT_AMBULATORY_CARE_PROVIDER_SITE_OTHER): Payer: Medicaid Other | Admitting: Family Medicine

## 2017-09-30 VITALS — BP 120/81 | HR 81 | Temp 98.2°F | Ht 64.0 in | Wt 285.0 lb

## 2017-09-30 DIAGNOSIS — E8881 Metabolic syndrome: Secondary | ICD-10-CM | POA: Diagnosis not present

## 2017-09-30 DIAGNOSIS — Z6841 Body Mass Index (BMI) 40.0 and over, adult: Secondary | ICD-10-CM | POA: Diagnosis not present

## 2017-09-30 MED ORDER — METFORMIN HCL 500 MG PO TABS: 500.0000 mg | ORAL_TABLET | Freq: Every day | ORAL | 0 refills | Status: DC

## 2017-09-30 NOTE — Progress Notes (Signed)
Office: [phone]  /  Fax: [phone]   HPI:   Chief Complaint: OBESITY Kelly Fry is here to discuss her progress with her obesity treatment plan. She is on the Category 2 plan and is following her eating plan approximately 100 % of the time. She states she is walking for 20 minutes 2 times per week. Kelly Fry continues to do well with weight loss. She is bored with breakfast and would like more options.  Her weight is 285 lb (129.3 kg) today and has had a weight loss of 3 pounds over a period of 2 weeks since her last visit. She has lost 8 lbs since starting treatment with [place].  Insulin Resistance Kelly Fry has a diagnosis of insulin resistance based on her elevated fasting insulin level >5. Although Kelly Fry's blood glucose readings are still under good control, insulin resistance puts her at greater risk of metabolic syndrome and diabetes. She is stable on metformin and she denies nausea, vomiting, or hypoglycemia. She continues to work on diet and exercise to decrease risk of diabetes.  ALLERGIES: Allergies  Allergen Reactions  . Latuda [Lurasidone] Other (See Comments)    Shaky; worsened mood    MEDICATIONS: Current Outpatient Medications on File Prior to Visit  Medication Sig Dispense Refill  . albuterol (PROVENTIL HFA;VENTOLIN HFA) 108 (90 Base) MCG/ACT inhaler Inhale 2 puffs into the lungs every 6 (six) hours as needed for wheezing or shortness of breath. 1 Inhaler 2  . hydrochlorothiazide (MICROZIDE) 12.5 MG capsule Take 1 capsule (12.5 mg total) by mouth daily. 30 capsule 3  . metFORMIN (GLUCOPHAGE) 500 MG tablet Take 1 tablet (500 mg total) by mouth daily with breakfast. 30 tablet 0  . Vitamin D, Ergocalciferol, (DRISDOL) 50000 units CAPS capsule Take 1 capsule (50,000 Units total) by mouth every 7 (seven) days. 4 capsule 0   No current facility-administered medications on file prior to visit.     PAST MEDICAL HISTORY: Past Medical History:  Diagnosis Date  . Ankle  instability, right   . Anxiety   . Back pain   . Bipolar disorder ([place])   . Complication of anesthesia   . Depression   . Dyspnea   . Family history of adverse reaction to anesthesia    Dad had n/v post anesthesia  . GERD (gastroesophageal reflux disease)   . Leg edema   . Medical history non-contributory   . Miscarriage   . PONV (postoperative nausea and vomiting)   . Vitamin D deficiency     PAST SURGICAL HISTORY: Past Surgical History:  Procedure Laterality Date  . ANKLE RECONSTRUCTION Right 02/21/2017   Procedure: RIGHT LATERAL ANKLE RECONSTRUCTION WITH SPLIT PERONEAL LONGUS TENDON;  Surgeon: Marybelle Killings, MD;  Location: [place];  Service: Orthopedics;  Laterality: Right;  . NO PAST SURGERIES    . WISDOM TOOTH EXTRACTION  04/12/2015    SOCIAL HISTORY: Social History   Tobacco Use  . Smoking status: Never Smoker  . Smokeless tobacco: Never Used  Substance Use Topics  . Alcohol use: No  . Drug use: No    FAMILY HISTORY: Family History  Problem Relation Age of Onset  . Kidney disease Father   . COPD Father   . Hypertension Father   . Depression Father   . Sleep apnea Father   . Obesity Father   . Kidney disease Sister   . Asthma Sister   . Early death Mother   . Stroke Mother   . Heart disease Mother   . Hypertension  Mother   . Alcoholism Mother   . Asthma Brother     ROS: Review of Systems  Constitutional: Positive for weight loss.  Gastrointestinal: Negative for nausea and vomiting.  Endo/Heme/Allergies:       Negative hypoglycemia    PHYSICAL EXAM: Blood pressure 120/81, pulse 81, temperature 98.2 F (36.8 C), temperature source Oral, height 5\' 4"  (1.626 m), weight 285 lb (129.3 kg), SpO2 98 %. Body mass index is 48.92 kg/m. Physical Exam  Constitutional: She is oriented to person, place, and time. She appears well-developed and well-nourished.  Cardiovascular: Normal rate.  Pulmonary/Chest: Effort normal.  Musculoskeletal: Normal range of  motion.  Neurological: She is oriented to person, place, and time.  Skin: Skin is warm and dry.  Psychiatric: She has a normal mood and affect. Her behavior is normal.  Vitals reviewed.   RECENT LABS AND TESTS: BMET    Component Value Date/Time   NA 140 09/02/2017 0942   K 4.1 09/02/2017 0942   CL 104 09/02/2017 0942   CO2 18 (L) 09/02/2017 0942   GLUCOSE 89 09/02/2017 0942   GLUCOSE 91 02/19/2017 0935   GLUCOSE 72 08/30/2013 1342   BUN 10 09/02/2017 0942   CREATININE 0.94 09/02/2017 0942   CREATININE 0.73 11/09/2013 1509   CALCIUM 9.9 09/02/2017 0942   GFRNONAA 82 09/02/2017 0942   GFRAA 94 09/02/2017 0942   Lab Results  Component Value Date   HGBA1C 5.2 09/02/2017   Lab Results  Component Value Date   INSULIN 42.7 (H) 09/02/2017   CBC    Component Value Date/Time   WBC 7.3 09/02/2017 0942   WBC 7.6 02/19/2017 0935   RBC 5.18 09/02/2017 0942   RBC 4.95 02/19/2017 0935   HGB 15.3 09/02/2017 0942   HCT 45.5 09/02/2017 0942   PLT 247 02/19/2017 0935   PLT 240 04/18/2016 0832   MCV 88 09/02/2017 0942   MCH 29.5 09/02/2017 0942   MCH 28.5 02/19/2017 0935   MCHC 33.6 09/02/2017 0942   MCHC 32.6 02/19/2017 0935   RDW 13.3 09/02/2017 0942   LYMPHSABS 1.5 09/02/2017 0942   MONOABS 0.4 06/01/2013 0956   EOSABS 0.1 09/02/2017 0942   BASOSABS 0.0 09/02/2017 0942   Iron/TIBC/Ferritin/ %Sat    Component Value Date/Time   IRON 101 06/30/2015 1007   TIBC 360 06/30/2015 1007   FERRITIN 56 06/30/2015 1007   IRONPCTSAT 28 06/30/2015 1007   Lipid Panel     Component Value Date/Time   CHOL 171 09/02/2017 0942   TRIG 73 09/02/2017 0942   HDL 45 09/02/2017 0942   CHOLHDL 3.5 06/30/2015 1007   LDLCALC 111 (H) 09/02/2017 0942   LDLDIRECT 118 (H) 06/24/2017 1633   Hepatic Function Panel     Component Value Date/Time   PROT 7.5 09/02/2017 0942   ALBUMIN 4.7 09/02/2017 0942   AST 19 09/02/2017 0942   ALT 26 09/02/2017 0942   ALKPHOS 101 09/02/2017 0942   BILITOT  0.3 09/02/2017 0942      Component Value Date/Time   TSH 2.760 09/02/2017 0942   TSH 2.880 06/24/2017 1633   TSH 2.740 06/30/2015 1007    ASSESSMENT AND PLAN: Insulin resistance - Plan: metFORMIN (GLUCOPHAGE) 500 MG tablet  Class 3 severe obesity with serious comorbidity and body mass index (BMI) of 45.0 to 49.9 in adult, unspecified obesity type ([place])  PLAN:  Insulin Resistance Kelly Fry will continue to work on weight loss, exercise, and decreasing simple carbohydrates in her diet to help decrease  the risk of diabetes. We dicussed metformin including benefits and risks. She was informed that eating too many simple carbohydrates or too many calories at one sitting increases the likelihood of GI side effects. Kelly Fry agrees to continue taking metformin 500 mg q AM #30 and we will refill for 1 month. Kelly Fry agrees to follow up with our clinic in 2 to 3 weeks as directed to monitor her progress.  Obesity Kelly Fry is currently in the action stage of change. As such, her goal is to continue with weight loss efforts She has agreed to keep a food journal with 250-350 calories and 20 grams of protein at breakfast daily and follow the Category 2 plan Kelly Fry has been instructed to work up to a goal of 150 minutes of combined cardio and strengthening exercise per week for weight loss and overall health benefits. We discussed the following Behavioral Modification Strategies today: increasing lean protein intake and decreasing simple carbohydrates    Kelly Fry has agreed to follow up with our clinic in 2 to 3 weeks. She was informed of the importance of frequent follow up visits to maximize her success with intensive lifestyle modifications for her multiple health conditions.   OBESITY BEHAVIORAL INTERVENTION VISIT  Today's visit was # 3   Starting weight: 293 lbs Starting date: 09/02/17 Today's weight : 285 lbs  Today's date: 09/30/2017 Total lbs lost to date: 8 At least 15 minutes were spent on  discussing the following behavioral intervention visit.   ASK: We discussed the diagnosis of obesity with Kelly Fry today and Kelly Fry agreed to give [place] permission to discuss obesity behavioral modification therapy today.  ASSESS: Kelly Fry has the diagnosis of obesity and her BMI today is 65.9 Kelly Fry is in the action stage of change   ADVISE: Kelly Fry was educated on the multiple health risks of obesity as well as the benefit of weight loss to improve her health. She was advised of the need for long term treatment and the importance of lifestyle modifications to improve her current health and to decrease her risk of future health problems.  AGREE: Multiple dietary modification options and treatment options were discussed and  Kelly Fry agreed to follow the recommendations documented in the above note.  ARRANGE: Kelly Fry was educated on the importance of frequent visits to treat obesity as outlined per CMS and USPSTF guidelines and agreed to schedule her next follow up appointment today.  I, Trixie Dredge, am acting as transcriptionist for Dennard Nip, MD  I have reviewed the above documentation for accuracy and completeness, and I agree with the above. -Dennard Nip, MD

## 2017-10-23 ENCOUNTER — Ambulatory Visit (INDEPENDENT_AMBULATORY_CARE_PROVIDER_SITE_OTHER): Payer: Medicaid Other | Admitting: Family Medicine

## 2017-10-23 VITALS — BP 127/84 | HR 73 | Temp 98.5°F | Ht 64.0 in | Wt 283.0 lb

## 2017-10-23 DIAGNOSIS — E559 Vitamin D deficiency, unspecified: Secondary | ICD-10-CM | POA: Diagnosis not present

## 2017-10-23 DIAGNOSIS — F3289 Other specified depressive episodes: Secondary | ICD-10-CM | POA: Diagnosis not present

## 2017-10-23 DIAGNOSIS — Z6841 Body Mass Index (BMI) 40.0 and over, adult: Secondary | ICD-10-CM

## 2017-10-23 MED ORDER — BUPROPION HCL ER (SR) 150 MG PO TB12: 150.0000 mg | ORAL_TABLET | Freq: Every day | ORAL | 0 refills | Status: DC

## 2017-10-23 MED ORDER — VITAMIN D (ERGOCALCIFEROL) 1.25 MG (50000 UNIT) PO CAPS: 50000.0000 [IU] | ORAL_CAPSULE | ORAL | 0 refills | Status: DC

## 2017-10-28 NOTE — Progress Notes (Signed)
Office: [phone]  /  Fax: [phone]   HPI:   Chief Complaint: OBESITY Kelly Fry is here to discuss her progress with her obesity treatment plan. She is on the  keep a food journal with 250 to 350 calories and 20 grams of protein at breakfast daily and the Category 2 plan and is following her eating plan approximately 30 % of the time. She states she is doing abcoaster 5 to 10 minutes 2 times per week. Kelly Fry continues to lose weight, but she is struggling more with emotional eating with the increase in stress at home. Her weight is 283 lb (128.4 kg) today and has had a weight loss of 2 pounds over a period of 3 weeks since her last visit. She has lost 10 lbs since starting treatment with [place].  Vitamin D deficiency Kelly Fry has a diagnosis of vitamin D deficiency. Kelly Fry is stable on vit D, but she is not yet at goal. Kelly Fry denies nausea, vomiting or muscle weakness.  Depression with emotional eating behaviors Keeleys mood decreased with the increase in stress at home. Kelly Fry has increased emotional eating and she has increased frustration and irritability. Kelly Fry struggles with emotional eating and using food for comfort to the extent that it is negatively impacting her health. She often snacks when she is not hungry. Kelly Fry sometimes feels she is out of control and then feels guilty that she made poor food choices. She has been working on behavior modification techniques to help reduce her emotional eating and has been somewhat successful. She shows no sign of suicidal or homicidal ideations.  Depression screen [facility] 2/9 09/02/2017 08/28/2017 07/15/2017 06/24/2017 06/14/2017  Decreased Interest 3 3 0 0 0  Down, Depressed, Hopeless 3 2 0 0 0  PHQ - 2 Score 6 5 0 0 0  Altered sleeping 3 2 - - -  Tired, decreased energy 3 3 - - -  Change in appetite 3 3 - - -  Feeling bad or failure about yourself  3 3 - - -  Trouble concentrating 3 2 - - -  Moving slowly or fidgety/restless 3 2 - - -  Suicidal  thoughts 1 0 - - -  PHQ-9 Score 25 20 - - -  Difficult doing work/chores Very difficult - - - -  Some recent data might be hidden     ALLERGIES: Allergies  Allergen Reactions  . Latuda [Lurasidone] Other (See Comments)    Shaky; worsened mood    MEDICATIONS: Current Outpatient Medications on File Prior to Visit  Medication Sig Dispense Refill  . albuterol (PROVENTIL HFA;VENTOLIN HFA) 108 (90 Base) MCG/ACT inhaler Inhale 2 puffs into the lungs every 6 (six) hours as needed for wheezing or shortness of breath. 1 Inhaler 2  . hydrochlorothiazide (MICROZIDE) 12.5 MG capsule Take 1 capsule (12.5 mg total) by mouth daily. 30 capsule 3  . metFORMIN (GLUCOPHAGE) 500 MG tablet Take 1 tablet (500 mg total) by mouth daily with breakfast. 30 tablet 0   No current facility-administered medications on file prior to visit.     PAST MEDICAL HISTORY: Past Medical History:  Diagnosis Date  . Ankle instability, right   . Anxiety   . Back pain   . Bipolar disorder ([place])   . Complication of anesthesia   . Depression   . Dyspnea   . Family history of adverse reaction to anesthesia    Dad had n/v post anesthesia  . GERD (gastroesophageal reflux disease)   . Leg edema   .  Medical history non-contributory   . Miscarriage   . PONV (postoperative nausea and vomiting)   . Vitamin D deficiency     PAST SURGICAL HISTORY: Past Surgical History:  Procedure Laterality Date  . ANKLE RECONSTRUCTION Right 02/21/2017   Procedure: RIGHT LATERAL ANKLE RECONSTRUCTION WITH SPLIT PERONEAL LONGUS TENDON;  Surgeon: Marybelle Killings, MD;  Location: [place];  Service: Orthopedics;  Laterality: Right;  . NO PAST SURGERIES    . WISDOM TOOTH EXTRACTION  04/12/2015    SOCIAL HISTORY: Social History   Tobacco Use  . Smoking status: Never Smoker  . Smokeless tobacco: Never Used  Substance Use Topics  . Alcohol use: No  . Drug use: No    FAMILY HISTORY: Family History  Problem Relation Age of Onset  . Kidney  disease Father   . COPD Father   . Hypertension Father   . Depression Father   . Sleep apnea Father   . Obesity Father   . Kidney disease Sister   . Asthma Sister   . Early death Mother   . Stroke Mother   . Heart disease Mother   . Hypertension Mother   . Alcoholism Mother   . Asthma Brother     ROS: Review of Systems  Constitutional: Positive for weight loss.  Gastrointestinal: Negative for nausea and vomiting.  Musculoskeletal:       Negative for muscle weakness  Psychiatric/Behavioral: Positive for depression. Negative for suicidal ideas.       Positive for Stress Positive for irritability     PHYSICAL EXAM: Blood pressure 127/84, pulse 73, temperature 98.5 F (36.9 C), temperature source Oral, height 5\' 4"  (1.626 m), weight 283 lb (128.4 kg), SpO2 97 %. Body mass index is 48.58 kg/m. Physical Exam  Constitutional: She is oriented to person, place, and time. She appears well-developed and well-nourished.  Cardiovascular: Normal rate.  Pulmonary/Chest: Effort normal.  Musculoskeletal: Normal range of motion.  Neurological: She is oriented to person, place, and time.  Skin: Skin is warm and dry.  Psychiatric: She has a normal mood and affect. Her behavior is normal.  Vitals reviewed.   RECENT LABS AND TESTS: BMET    Component Value Date/Time   NA 140 09/02/2017 0942   K 4.1 09/02/2017 0942   CL 104 09/02/2017 0942   CO2 18 (L) 09/02/2017 0942   GLUCOSE 89 09/02/2017 0942   GLUCOSE 91 02/19/2017 0935   GLUCOSE 72 08/30/2013 1342   BUN 10 09/02/2017 0942   CREATININE 0.94 09/02/2017 0942   CREATININE 0.73 11/09/2013 1509   CALCIUM 9.9 09/02/2017 0942   GFRNONAA 82 09/02/2017 0942   GFRAA 94 09/02/2017 0942   Lab Results  Component Value Date   HGBA1C 5.2 09/02/2017   Lab Results  Component Value Date   INSULIN 42.7 (H) 09/02/2017   CBC    Component Value Date/Time   WBC 7.3 09/02/2017 0942   WBC 7.6 02/19/2017 0935   RBC 5.18 09/02/2017 0942     RBC 4.95 02/19/2017 0935   HGB 15.3 09/02/2017 0942   HCT 45.5 09/02/2017 0942   PLT 247 02/19/2017 0935   PLT 240 04/18/2016 0832   MCV 88 09/02/2017 0942   MCH 29.5 09/02/2017 0942   MCH 28.5 02/19/2017 0935   MCHC 33.6 09/02/2017 0942   MCHC 32.6 02/19/2017 0935   RDW 13.3 09/02/2017 0942   LYMPHSABS 1.5 09/02/2017 0942   MONOABS 0.4 06/01/2013 0956   EOSABS 0.1 09/02/2017 0942   BASOSABS 0.0  09/02/2017 0942   Iron/TIBC/Ferritin/ %Sat    Component Value Date/Time   IRON 101 06/30/2015 1007   TIBC 360 06/30/2015 1007   FERRITIN 56 06/30/2015 1007   IRONPCTSAT 28 06/30/2015 1007   Lipid Panel     Component Value Date/Time   CHOL 171 09/02/2017 0942   TRIG 73 09/02/2017 0942   HDL 45 09/02/2017 0942   CHOLHDL 3.5 06/30/2015 1007   LDLCALC 111 (H) 09/02/2017 0942   LDLDIRECT 118 (H) 06/24/2017 1633   Hepatic Function Panel     Component Value Date/Time   PROT 7.5 09/02/2017 0942   ALBUMIN 4.7 09/02/2017 0942   AST 19 09/02/2017 0942   ALT 26 09/02/2017 0942   ALKPHOS 101 09/02/2017 0942   BILITOT 0.3 09/02/2017 0942      Component Value Date/Time   TSH 2.760 09/02/2017 0942   TSH 2.880 06/24/2017 1633   TSH 2.740 06/30/2015 1007   Results for SHAMANDA, LEN (MRN [id number]) as of 10/28/2017 09:41  Ref. Range 09/02/2017 09:42  Vitamin D, 25-Hydroxy Latest Ref Range: 30.0 - 100.0 ng/mL 28.7 (L)   ASSESSMENT AND PLAN: Vitamin D deficiency - Plan: Vitamin D, Ergocalciferol, (DRISDOL) 50000 units CAPS capsule  Other depression - with emotional eating - Plan: buPROPion (WELLBUTRIN SR) 150 MG 12 hr tablet  Class 3 severe obesity with serious comorbidity and body mass index (BMI) of 45.0 to 49.9 in adult, unspecified obesity type ([place])  PLAN:  Vitamin D Deficiency Kelly Fry was informed that low vitamin D levels contributes to fatigue and are associated with obesity, breast, and colon cancer. She agrees to continue to take prescription Vit D @50 ,000 IU every  week #4 with no refills and will follow up for routine testing of vitamin D, at least 2-3 times per year. She was informed of the risk of over-replacement of vitamin D and agrees to not increase her dose unless she discusses this with [place] first. Ricarda agrees to follow up as directed.  Depression with Emotional Eating Behaviors We discussed behavior modification techniques today to help Samika deal with her emotional eating and depression. She has agreed to start Wellbutrin SR 150 mg qAM #30 with no refills and follow up with our clinic in 3 weeks.  Obesity Raynie is currently in the action stage of change. As such, her goal is to continue with weight loss efforts She has agreed to follow the Category 2 plan Ludie has been instructed to work up to a goal of 150 minutes of combined cardio and strengthening exercise per week for weight loss and overall health benefits. We discussed the following Behavioral Modification Strategies today: increasing lean protein intake, decreasing simple carbohydrates , work on meal planning and easy cooking plans and emotional eating strategies  Marlyss has agreed to follow up with our clinic in 3 weeks. She was informed of the importance of frequent follow up visits to maximize her success with intensive lifestyle modifications for her multiple health conditions.   OBESITY BEHAVIORAL INTERVENTION VISIT  Today's visit was # 4   Starting weight: 293 lbs Starting date: 09/02/17 Today's weight : 283 lbs  Today's date: 10/23/2017 Total lbs lost to date: 10 At least 15 minutes were spent on discussing the following behavioral intervention visit.   ASK: We discussed the diagnosis of obesity with [name] today and Talulah agreed to give [place] permission to discuss obesity behavioral modification therapy today.  ASSESS: Daveah has the diagnosis of obesity and her BMI today is 48.55  Layia is in the action stage of change   ADVISE: Laurelyn was educated on the  multiple health risks of obesity as well as the benefit of weight loss to improve her health. She was advised of the need for long term treatment and the importance of lifestyle modifications to improve her current health and to decrease her risk of future health problems.  AGREE: Multiple dietary modification options and treatment options were discussed and  Katanya agreed to follow the recommendations documented in the above note.  ARRANGE: Gisela was educated on the importance of frequent visits to treat obesity as outlined per CMS and USPSTF guidelines and agreed to schedule her next follow up appointment today.  I, Doreene Nest, am acting as transcriptionist for Dennard Nip, MD  I have reviewed the above documentation for accuracy and completeness, and I agree with the above. -Dennard Nip, MD

## 2017-11-13 ENCOUNTER — Ambulatory Visit (INDEPENDENT_AMBULATORY_CARE_PROVIDER_SITE_OTHER): Payer: Medicaid Other | Admitting: Family Medicine

## 2017-11-13 VITALS — BP 119/83 | HR 83 | Temp 98.1°F | Ht 64.0 in | Wt 278.0 lb

## 2017-11-13 DIAGNOSIS — E559 Vitamin D deficiency, unspecified: Secondary | ICD-10-CM | POA: Diagnosis not present

## 2017-11-13 DIAGNOSIS — Z6841 Body Mass Index (BMI) 40.0 and over, adult: Secondary | ICD-10-CM | POA: Diagnosis not present

## 2017-11-13 MED ORDER — VITAMIN D (ERGOCALCIFEROL) 1.25 MG (50000 UNIT) PO CAPS: 50000.0000 [IU] | ORAL_CAPSULE | ORAL | 0 refills | Status: DC

## 2017-11-13 NOTE — Progress Notes (Signed)
Office: [phone]  /  Fax: [phone]   HPI:   Chief Complaint: OBESITY Kelly Fry is here to discuss her progress with her obesity treatment plan. She is on the Category 2 plan and is following her eating plan approximately 0 % of the time. She states she is walking for 15 minutes 2 times per week. Kelly Fry continues to do well with weight loss even with struggling to follow her plan closely. She was still mindful about her food choices and tried to portion control when she indulged.  Her weight is 278 lb (126.1 kg) today and has had a weight loss of 5 pounds over a period of 3 weeks since her last visit. She has lost 15 lbs since starting treatment with [place].  Vitamin D Deficiency Kelly Fry has a diagnosis of vitamin D deficiency. She is stable on prescription Vit D, but level is not yet at goal. She denies nausea, vomiting or muscle weakness.  ALLERGIES: Allergies  Allergen Reactions  . Latuda [Lurasidone] Other (See Comments)    Shaky; worsened mood    MEDICATIONS: Current Outpatient Medications on File Prior to Visit  Medication Sig Dispense Refill  . albuterol (PROVENTIL HFA;VENTOLIN HFA) 108 (90 Base) MCG/ACT inhaler Inhale 2 puffs into the lungs every 6 (six) hours as needed for wheezing or shortness of breath. 1 Inhaler 2  . buPROPion (WELLBUTRIN SR) 150 MG 12 hr tablet Take 1 tablet (150 mg total) by mouth daily. 30 tablet 0  . hydrochlorothiazide (MICROZIDE) 12.5 MG capsule Take 1 capsule (12.5 mg total) by mouth daily. 30 capsule 3  . metFORMIN (GLUCOPHAGE) 500 MG tablet Take 1 tablet (500 mg total) by mouth daily with breakfast. 30 tablet 0   No current facility-administered medications on file prior to visit.     PAST MEDICAL HISTORY: Past Medical History:  Diagnosis Date  . Ankle instability, right   . Anxiety   . Back pain   . Bipolar disorder ([place])   . Complication of anesthesia   . Depression   . Dyspnea   . Family history of adverse reaction to anesthesia    Dad had n/v post anesthesia  . GERD (gastroesophageal reflux disease)   . Leg edema   . Medical history non-contributory   . Miscarriage   . PONV (postoperative nausea and vomiting)   . Vitamin D deficiency     PAST SURGICAL HISTORY: Past Surgical History:  Procedure Laterality Date  . ANKLE RECONSTRUCTION Right 02/21/2017   Procedure: RIGHT LATERAL ANKLE RECONSTRUCTION WITH SPLIT PERONEAL LONGUS TENDON;  Surgeon: Marybelle Killings, MD;  Location: [facility];  Service: Orthopedics;  Laterality: Right;  . NO PAST SURGERIES    . WISDOM TOOTH EXTRACTION  04/12/2015    SOCIAL HISTORY: Social History   Tobacco Use  . Smoking status: Never Smoker  . Smokeless tobacco: Never Used  Substance Use Topics  . Alcohol use: No  . Drug use: No    FAMILY HISTORY: Family History  Problem Relation Age of Onset  . Kidney disease Father   . COPD Father   . Hypertension Father   . Depression Father   . Sleep apnea Father   . Obesity Father   . Kidney disease Sister   . Asthma Sister   . Early death Mother   . Stroke Mother   . Heart disease Mother   . Hypertension Mother   . Alcoholism Mother   . Asthma Brother     ROS: Review of Systems  Constitutional: Positive  for weight loss.  Gastrointestinal: Negative for nausea and vomiting.  Musculoskeletal:       Negative muscle weakness    PHYSICAL EXAM: Blood pressure 119/83, pulse 83, temperature 98.1 F (36.7 C), temperature source Oral, height 5\' 4"  (1.626 m), weight 278 lb (126.1 kg), SpO2 98 %. Body mass index is 47.72 kg/m. Physical Exam  Constitutional: She is oriented to person, place, and time. She appears well-developed and well-nourished.  Cardiovascular: Normal rate.  Pulmonary/Chest: Effort normal.  Musculoskeletal: Normal range of motion.  Neurological: She is oriented to person, place, and time.  Skin: Skin is warm and dry.  Psychiatric: She has a normal mood and affect. Her behavior is normal.  Vitals  reviewed.   RECENT LABS AND TESTS: BMET    Component Value Date/Time   NA 140 09/02/2017 0942   K 4.1 09/02/2017 0942   CL 104 09/02/2017 0942   CO2 18 (L) 09/02/2017 0942   GLUCOSE 89 09/02/2017 0942   GLUCOSE 91 02/19/2017 0935   GLUCOSE 72 08/30/2013 1342   BUN 10 09/02/2017 0942   CREATININE 0.94 09/02/2017 0942   CREATININE 0.73 11/09/2013 1509   CALCIUM 9.9 09/02/2017 0942   GFRNONAA 82 09/02/2017 0942   GFRAA 94 09/02/2017 0942   Lab Results  Component Value Date   HGBA1C 5.2 09/02/2017   Lab Results  Component Value Date   INSULIN 42.7 (H) 09/02/2017   CBC    Component Value Date/Time   WBC 7.3 09/02/2017 0942   WBC 7.6 02/19/2017 0935   RBC 5.18 09/02/2017 0942   RBC 4.95 02/19/2017 0935   HGB 15.3 09/02/2017 0942   HCT 45.5 09/02/2017 0942   PLT 247 02/19/2017 0935   PLT 240 04/18/2016 0832   MCV 88 09/02/2017 0942   MCH 29.5 09/02/2017 0942   MCH 28.5 02/19/2017 0935   MCHC 33.6 09/02/2017 0942   MCHC 32.6 02/19/2017 0935   RDW 13.3 09/02/2017 0942   LYMPHSABS 1.5 09/02/2017 0942   MONOABS 0.4 06/01/2013 0956   EOSABS 0.1 09/02/2017 0942   BASOSABS 0.0 09/02/2017 0942   Iron/TIBC/Ferritin/ %Sat    Component Value Date/Time   IRON 101 06/30/2015 1007   TIBC 360 06/30/2015 1007   FERRITIN 56 06/30/2015 1007   IRONPCTSAT 28 06/30/2015 1007   Lipid Panel     Component Value Date/Time   CHOL 171 09/02/2017 0942   TRIG 73 09/02/2017 0942   HDL 45 09/02/2017 0942   CHOLHDL 3.5 06/30/2015 1007   LDLCALC 111 (H) 09/02/2017 0942   LDLDIRECT 118 (H) 06/24/2017 1633   Hepatic Function Panel     Component Value Date/Time   PROT 7.5 09/02/2017 0942   ALBUMIN 4.7 09/02/2017 0942   AST 19 09/02/2017 0942   ALT 26 09/02/2017 0942   ALKPHOS 101 09/02/2017 0942   BILITOT 0.3 09/02/2017 0942      Component Value Date/Time   TSH 2.760 09/02/2017 0942   TSH 2.880 06/24/2017 1633   TSH 2.740 06/30/2015 1007  Results for QUINLEE, SCIARRA (MRN  [id number]) as of 11/13/2017 17:03  Ref. Range 09/02/2017 09:42  Vitamin D, 25-Hydroxy Latest Ref Range: 30.0 - 100.0 ng/mL 28.7 (L)    ASSESSMENT AND PLAN: Vitamin D deficiency - Plan: Vitamin D, Ergocalciferol, (DRISDOL) 50000 units CAPS capsule  Class 3 severe obesity with serious comorbidity and body mass index (BMI) of 45.0 to 49.9 in adult, unspecified obesity type ([place])  PLAN:  Vitamin D Deficiency Delberta was informed that low  vitamin D levels contributes to fatigue and are associated with obesity, breast, and colon cancer. Deyja agrees to continue taking prescription Vit D @50 ,000 IU every week #4 and we will refill for 1 month. She will follow up for routine testing of vitamin D, at least 2-3 times per year. She was informed of the risk of over-replacement of vitamin D and agrees to not increase her dose unless she discusses this with [place] first. Christon agrees to follow up with our clinic in 2 to 3 weeks and we will recheck labs at that time.  Obesity Christon is currently in the action stage of change. As such, her goal is to continue with weight loss efforts She has agreed to follow the Category 2 plan Saray has been instructed to work up to a goal of 150 minutes of combined cardio and strengthening exercise per week for weight loss and overall health benefits. We discussed the following Behavioral Modification Strategies today: increasing lean protein intake, decreasing simple carbohydrates , decrease eating out and work on meal planning and easy cooking plans   Meigan has agreed to follow up with our clinic in 2 to 3 weeks. She was informed of the importance of frequent follow up visits to maximize her success with intensive lifestyle modifications for her multiple health conditions.   OBESITY BEHAVIORAL INTERVENTION VISIT  Today's visit was # 5   Starting weight: 293 lbs Starting date: 09/02/17 Today's weight : 278 lbs Today's date: 11/13/2017 Total lbs lost to date: 15 At  least 15 minutes were spent on discussing the following behavioral intervention visit.   ASK: We discussed the diagnosis of obesity with [name] today and Deliliah agreed to give [place] permission to discuss obesity behavioral modification therapy today.  ASSESS: Nayely has the diagnosis of obesity and her BMI today is 32.7 Niza is in the action stage of change   ADVISE: Wende was educated on the multiple health risks of obesity as well as the benefit of weight loss to improve her health. She was advised of the need for long term treatment and the importance of lifestyle modifications to improve her current health and to decrease her risk of future health problems.  AGREE: Multiple dietary modification options and treatment options were discussed and  Romie agreed to follow the recommendations documented in the above note.  ARRANGE: Reneshia was educated on the importance of frequent visits to treat obesity as outlined per CMS and USPSTF guidelines and agreed to schedule her next follow up appointment today.  I, Trixie Dredge, am acting as transcriptionist for Dennard Nip, MD  I have reviewed the above documentation for accuracy and completeness, and I agree with the above. -Dennard Nip, MD

## 2017-11-19 ENCOUNTER — Other Ambulatory Visit (INDEPENDENT_AMBULATORY_CARE_PROVIDER_SITE_OTHER): Payer: Self-pay | Admitting: Family Medicine

## 2017-11-19 DIAGNOSIS — F3289 Other specified depressive episodes: Secondary | ICD-10-CM

## 2017-11-21 ENCOUNTER — Encounter (HOSPITAL_COMMUNITY): Payer: Self-pay | Admitting: Physical Therapy

## 2017-11-21 NOTE — Therapy (Signed)
[facility] [place], [place], [address] Phone: [phone]   Fax:  [phone]  Patient Details  Name: Kelly Fry MRN: [id number] Date of Birth: 09/28/1985 Referring Provider: Rodell Perna   Encounter Date: 11/21/2017  PHYSICAL THERAPY DISCHARGE SUMMARY  Visits from Start of Care: 5  Current functional level related to goals / functional outcomes: Unknown due to pt not returning since last treatment    Remaining deficits:   Unknown due to pt not returning since last treatment  Education / Equipment: HEP Plan: Patient agrees to discharge.  Patient goals were partially met. Patient is being discharged due to not returning since the last visit.  ?????     Rayetta Humphrey, PT CLT [phone] 11/21/2017, 11:24 AM  [facility] [place], [place], [address] Phone: [phone]   Fax:  [phone]

## 2017-12-02 ENCOUNTER — Ambulatory Visit (INDEPENDENT_AMBULATORY_CARE_PROVIDER_SITE_OTHER): Payer: Medicaid Other | Admitting: Family Medicine

## 2017-12-02 VITALS — BP 130/83 | HR 84 | Temp 98.4°F | Ht 64.0 in | Wt 277.0 lb

## 2017-12-02 DIAGNOSIS — F3289 Other specified depressive episodes: Secondary | ICD-10-CM

## 2017-12-02 DIAGNOSIS — E8881 Metabolic syndrome: Secondary | ICD-10-CM

## 2017-12-02 DIAGNOSIS — Z6841 Body Mass Index (BMI) 40.0 and over, adult: Secondary | ICD-10-CM | POA: Diagnosis not present

## 2017-12-02 DIAGNOSIS — E88819 Insulin resistance, unspecified: Secondary | ICD-10-CM

## 2017-12-02 DIAGNOSIS — E66813 Obesity, class 3: Secondary | ICD-10-CM

## 2017-12-02 DIAGNOSIS — E559 Vitamin D deficiency, unspecified: Secondary | ICD-10-CM | POA: Diagnosis not present

## 2017-12-02 MED ORDER — METFORMIN HCL 500 MG PO TABS: 500.0000 mg | ORAL_TABLET | Freq: Every day | ORAL | 0 refills | Status: DC

## 2017-12-02 MED ORDER — BUPROPION HCL ER (SR) 150 MG PO TB12: 150.0000 mg | ORAL_TABLET | Freq: Every day | ORAL | 0 refills | Status: DC

## 2017-12-02 MED ORDER — VITAMIN D (ERGOCALCIFEROL) 1.25 MG (50000 UNIT) PO CAPS: 50000.0000 [IU] | ORAL_CAPSULE | ORAL | 0 refills | Status: DC

## 2017-12-03 ENCOUNTER — Other Ambulatory Visit (INDEPENDENT_AMBULATORY_CARE_PROVIDER_SITE_OTHER): Payer: Self-pay | Admitting: Family Medicine

## 2017-12-03 DIAGNOSIS — E8881 Metabolic syndrome: Secondary | ICD-10-CM

## 2017-12-03 LAB — HEMOGLOBIN A1C
Est. average glucose Bld gHb Est-mCnc: 97 mg/dL
Hgb A1c MFr Bld: 5 % (ref 4.8–5.6)

## 2017-12-03 LAB — COMPREHENSIVE METABOLIC PANEL
ALBUMIN: 4.8 g/dL (ref 3.5–5.5)
ALT: 21 IU/L (ref 0–32)
AST: 18 IU/L (ref 0–40)
Albumin/Globulin Ratio: 1.8 (ref 1.2–2.2)
Alkaline Phosphatase: 95 IU/L (ref 39–117)
BUN / CREAT RATIO: 13 (ref 9–23)
BUN: 12 mg/dL (ref 6–20)
Bilirubin Total: 0.4 mg/dL (ref 0.0–1.2)
CALCIUM: 9.5 mg/dL (ref 8.7–10.2)
CO2: 20 mmol/L (ref 20–29)
CREATININE: 0.94 mg/dL (ref 0.57–1.00)
Chloride: 100 mmol/L (ref 96–106)
GFR calc Af Amer: 94 mL/min/{1.73_m2} (ref >59)
GFR, EST NON AFRICAN AMERICAN: 82 mL/min/{1.73_m2} (ref >59)
GLOBULIN, TOTAL: 2.7 g/dL (ref 1.5–4.5)
Glucose: 75 mg/dL (ref 65–99)
Potassium: 3.8 mmol/L (ref 3.5–5.2)
SODIUM: 138 mmol/L (ref 134–144)
Total Protein: 7.5 g/dL (ref 6.0–8.5)

## 2017-12-03 LAB — VITAMIN D 25 HYDROXY (VIT D DEFICIENCY, FRACTURES): Vit D, 25-Hydroxy: 35.4 ng/mL (ref 30.0–100.0)

## 2017-12-03 LAB — LIPID PANEL WITH LDL/HDL RATIO
Cholesterol, Total: 168 mg/dL (ref 100–199)
HDL: 39 mg/dL — AB (ref >39)
LDL CALC: 107 mg/dL — AB (ref 0–99)
LDL/HDL RATIO: 2.7 ratio (ref 0.0–3.2)
TRIGLYCERIDES: 108 mg/dL (ref 0–149)
VLDL Cholesterol Cal: 22 mg/dL (ref 5–40)

## 2017-12-03 LAB — INSULIN, RANDOM: INSULIN: 20.3 u[IU]/mL (ref 2.6–24.9)

## 2017-12-04 ENCOUNTER — Telehealth (INDEPENDENT_AMBULATORY_CARE_PROVIDER_SITE_OTHER): Payer: Self-pay | Admitting: Family Medicine

## 2017-12-04 ENCOUNTER — Encounter (INDEPENDENT_AMBULATORY_CARE_PROVIDER_SITE_OTHER): Payer: Self-pay | Admitting: Family Medicine

## 2017-12-04 ENCOUNTER — Encounter (INDEPENDENT_AMBULATORY_CARE_PROVIDER_SITE_OTHER): Payer: Self-pay

## 2017-12-04 NOTE — Telephone Encounter (Signed)
Patient states her pharmacy, [facility] in [place], has told her the refill for Metformin has been denied 2x's.  Please advise. Thank you. Kelly Fry

## 2017-12-04 NOTE — Progress Notes (Signed)
Office: [phone]  /  Fax: [phone]   HPI:   Chief Complaint: OBESITY Kelly Fry is here to discuss her progress with her obesity treatment plan. She is on the  follow the Category 2 plan and is following her eating plan approximately 80 % of the time. She states she is exercising 0 minutes 0 times per week. Kelly Fry continues to lose on her Category 2 plan. She is getting bored with lunch and would like more options. Hunger is mostly controlled.   Her weight is 277 lb (125.6 kg) today and has had a weight loss of 1 pounds over a period of 3 weeks since her last visit. She has lost 16 lbs since starting treatment with [place].  Vitamin D deficiency Kelly Fry has a diagnosis of vitamin D deficiency. She is currently on prescription Vit D and stable. She denies nausea, vomiting or muscle weakness. Kelly Fry is due for labs.  Insulin Resistance [name] has a diagnosis of insulin resistance based on her elevated fasting insulin level >5. Although Demia's blood glucose readings are still under good control, insulin resistance puts her at greater risk of metabolic syndrome and diabetes. She is stable on Metformin and doing well with diet. She continues to work on diet and exercise to decrease risk of diabetes. Due for labs.   Depression with emotional eating behaviors Kelly Fry is struggling with emotional eating and using food for comfort to the extent that it is negatively impacting her health. She often snacks when she is not hungry. Kelly Fry sometimes feels she is out of control and then feels guilty that she made poor food choices. Kelly Fry's mood is stable on Buproprion. Denies insomnia, BP stable and has decreased emotional eating.  She has been working on behavior modification techniques to help reduce her emotional eating and has been somewhat successful. She shows no sign of suicidal or homicidal ideations.  Depression screen [facility] 2/9 09/02/2017 08/28/2017 07/15/2017 06/24/2017 06/14/2017  Decreased Interest 3 3  0 0 0  Down, Depressed, Hopeless 3 2 0 0 0  PHQ - 2 Score 6 5 0 0 0  Altered sleeping 3 2 - - -  Tired, decreased energy 3 3 - - -  Change in appetite 3 3 - - -  Feeling bad or failure about yourself  3 3 - - -  Trouble concentrating 3 2 - - -  Moving slowly or fidgety/restless 3 2 - - -  Suicidal thoughts 1 0 - - -  PHQ-9 Score 25 20 - - -  Difficult doing work/chores Very difficult - - - -  Some recent data might be hidden      ALLERGIES: Allergies  Allergen Reactions  . Latuda [Lurasidone] Other (See Comments)    Shaky; worsened mood    MEDICATIONS: Current Outpatient Medications on File Prior to Visit  Medication Sig Dispense Refill  . albuterol (PROVENTIL HFA;VENTOLIN HFA) 108 (90 Base) MCG/ACT inhaler Inhale 2 puffs into the lungs every 6 (six) hours as needed for wheezing or shortness of breath. 1 Inhaler 2  . hydrochlorothiazide (MICROZIDE) 12.5 MG capsule Take 1 capsule (12.5 mg total) by mouth daily. 30 capsule 3   No current facility-administered medications on file prior to visit.     PAST MEDICAL HISTORY: Past Medical History:  Diagnosis Date  . Ankle instability, right   . Anxiety   . Back pain   . Bipolar disorder ([place])   . Complication of anesthesia   . Depression   . Dyspnea   .  Family history of adverse reaction to anesthesia    Dad had n/v post anesthesia  . GERD (gastroesophageal reflux disease)   . Leg edema   . Medical history non-contributory   . Miscarriage   . PONV (postoperative nausea and vomiting)   . Vitamin D deficiency     PAST SURGICAL HISTORY: Past Surgical History:  Procedure Laterality Date  . ANKLE RECONSTRUCTION Right 02/21/2017   Procedure: RIGHT LATERAL ANKLE RECONSTRUCTION WITH SPLIT PERONEAL LONGUS TENDON;  Surgeon: Marybelle Killings, MD;  Location: [facility];  Service: Orthopedics;  Laterality: Right;  . NO PAST SURGERIES    . WISDOM TOOTH EXTRACTION  04/12/2015    SOCIAL HISTORY: Social History   Tobacco Use  .  Smoking status: Never Smoker  . Smokeless tobacco: Never Used  Substance Use Topics  . Alcohol use: No  . Drug use: No    FAMILY HISTORY: Family History  Problem Relation Age of Onset  . Kidney disease Father   . COPD Father   . Hypertension Father   . Depression Father   . Sleep apnea Father   . Obesity Father   . Kidney disease Sister   . Asthma Sister   . Early death Mother   . Stroke Mother   . Heart disease Mother   . Hypertension Mother   . Alcoholism Mother   . Asthma Brother     ROS: Review of Systems  Constitutional: Positive for weight loss.  Gastrointestinal: Negative for nausea and vomiting.  Musculoskeletal:       Negative for muscle weakness  Psychiatric/Behavioral: Positive for depression. Negative for suicidal ideas.    PHYSICAL EXAM: Blood pressure 130/83, pulse 84, temperature 98.4 F (36.9 C), temperature source Oral, height 5\' 4"  (1.626 m), weight 277 lb (125.6 kg), SpO2 97 %. Body mass index is 47.55 kg/m. Physical Exam  Constitutional: She is oriented to person, place, and time. She appears well-developed and well-nourished.  Cardiovascular: Normal rate.  Pulmonary/Chest: Effort normal.  Musculoskeletal: Normal range of motion.  Neurological: She is alert and oriented to person, place, and time.  Skin: Skin is warm and dry.  Psychiatric: She has a normal mood and affect. Her behavior is normal.  Vitals reviewed.   RECENT LABS AND TESTS: BMET    Component Value Date/Time   NA 138 12/02/2017 1254   K 3.8 12/02/2017 1254   CL 100 12/02/2017 1254   CO2 20 12/02/2017 1254   GLUCOSE 75 12/02/2017 1254   GLUCOSE 91 02/19/2017 0935   GLUCOSE 72 08/30/2013 1342   BUN 12 12/02/2017 1254   CREATININE 0.94 12/02/2017 1254   CREATININE 0.73 11/09/2013 1509   CALCIUM 9.5 12/02/2017 1254   GFRNONAA 82 12/02/2017 1254   GFRAA 94 12/02/2017 1254   Lab Results  Component Value Date   HGBA1C 5.0 12/02/2017   HGBA1C 5.2 09/02/2017   Lab  Results  Component Value Date   INSULIN 20.3 12/02/2017   INSULIN 42.7 (H) 09/02/2017   CBC    Component Value Date/Time   WBC 7.3 09/02/2017 0942   WBC 7.6 02/19/2017 0935   RBC 5.18 09/02/2017 0942   RBC 4.95 02/19/2017 0935   HGB 15.3 09/02/2017 0942   HCT 45.5 09/02/2017 0942   PLT 247 02/19/2017 0935   PLT 240 04/18/2016 0832   MCV 88 09/02/2017 0942   MCH 29.5 09/02/2017 0942   MCH 28.5 02/19/2017 0935   MCHC 33.6 09/02/2017 0942   MCHC 32.6 02/19/2017 0935  RDW 13.3 09/02/2017 0942   LYMPHSABS 1.5 09/02/2017 0942   MONOABS 0.4 06/01/2013 0956   EOSABS 0.1 09/02/2017 0942   BASOSABS 0.0 09/02/2017 0942   Iron/TIBC/Ferritin/ %Sat    Component Value Date/Time   IRON 101 06/30/2015 1007   TIBC 360 06/30/2015 1007   FERRITIN 56 06/30/2015 1007   IRONPCTSAT 28 06/30/2015 1007   Lipid Panel     Component Value Date/Time   CHOL 168 12/02/2017 1254   TRIG 108 12/02/2017 1254   HDL 39 (L) 12/02/2017 1254   CHOLHDL 3.5 06/30/2015 1007   LDLCALC 107 (H) 12/02/2017 1254   LDLDIRECT 118 (H) 06/24/2017 1633   Hepatic Function Panel     Component Value Date/Time   PROT 7.5 12/02/2017 1254   ALBUMIN 4.8 12/02/2017 1254   AST 18 12/02/2017 1254   ALT 21 12/02/2017 1254   ALKPHOS 95 12/02/2017 1254   BILITOT 0.4 12/02/2017 1254      Component Value Date/Time   TSH 2.760 09/02/2017 0942   TSH 2.880 06/24/2017 1633   TSH 2.740 06/30/2015 1007   Results for MEKENZIE, MODESTE (MRN [id number]) as of 12/04/2017 17:10  Ref. Range 12/02/2017 12:54  Vitamin D, 25-Hydroxy Latest Ref Range: 30.0 - 100.0 ng/mL 35.4    ASSESSMENT AND PLAN: Vitamin D deficiency - Plan: VITAMIN D 25 Hydroxy (Vit-D Deficiency, Fractures), Vitamin D, Ergocalciferol, (DRISDOL) 50000 units CAPS capsule  Insulin resistance - Plan: Comprehensive metabolic panel, Hemoglobin A1c, Insulin, random, Lipid Panel With LDL/HDL Ratio, metFORMIN (GLUCOPHAGE) 500 MG tablet  Other depression - with  emotional eating - Plan: buPROPion (WELLBUTRIN SR) 150 MG 12 hr tablet  Class 3 severe obesity with serious comorbidity and body mass index (BMI) of 45.0 to 49.9 in adult, unspecified obesity type ([facility])  PLAN: Vitamin D Deficiency Sueko was informed that low vitamin D levels contributes to fatigue and are associated with obesity, breast, and colon cancer. She agrees to continue taking prescription Vit D @50 ,000 IU every week #4 with no refills. Jamiee will have labs done today to recheck Vit D level. She was informed of the risk of over-replacement of vitamin D and agrees to not increase her dose unless she discusses this with [place] first. Haydn agrees to follow up with our clinic in 2-3 weeks.   Insulin Resistance Janicia will continue to work on weight loss, exercise, and decreasing simple carbohydrates in her diet to help decrease the risk of diabetes. We dicussed metformin including benefits and risks. She was informed that eating too many simple carbohydrates or too many calories at one sitting increases the likelihood of GI side effects. Caridad will continue taking Metformin 500 mg qd #30 with no refills. Taylon agreed to follow up with [place] as directed to monitor her progress. Labs will be checked today. Pebbles agrees to follow up with our clinic in 2-3 weeks.   Depression with Emotional Eating Behaviors We discussed behavior modification techniques today to help Raisa deal with her emotional eating and depression. She has agreed to continue taking Wellbutrin SR 150 mg qd #30 with no refills. Ariely agrees to follow up in our office in 2-3 weeks.   Obesity Jasmon is currently in the action stage of change. As such, her goal is to continue with weight loss efforts She has agreed to follow the Category 2 plan with luch food journaling 300-400 calories and 30 grams protein Embree has been instructed to work up to a goal of 150 minutes of combined cardio and strengthening  exercise per week for  weight loss and overall health benefits. We discussed the following Behavioral Modification Stratagies today: increasing lean protein intake and decreasing simple carbohydrates   Porscha has agreed to follow up with our clinic in 3 weeks. She was informed of the importance of frequent follow up visits to maximize her success with intensive lifestyle modifications for her multiple health conditions.   OBESITY BEHAVIORAL INTERVENTION VISIT  Today's visit was # 6   Starting weight: 293 Starting date: 09/02/17 Today's weight : Weight: 277 lb (125.6 kg)  Today's date: 12/04/2017 Total lbs lost to date: 16 At least 15 minutes were spent on discussing the following behavioral intervention visit.   ASK: We discussed the diagnosis of obesity with [name] today and Milli agreed to give [place] permission to discuss obesity behavioral modification therapy today.  ASSESS: Machel has the diagnosis of obesity and her BMI today is 47.52 Karcyn is in the action stage of change   ADVISE: Yee was educated on the multiple health risks of obesity as well as the benefit of weight loss to improve her health. She was advised of the need for long term treatment and the importance of lifestyle modifications to improve her current health and to decrease her risk of future health problems.  AGREE: Multiple dietary modification options and treatment options were discussed and  Zaniah agreed to follow the recommendations documented in the above note.  ARRANGE: Anne was educated on the importance of frequent visits to treat obesity as outlined per CMS and USPSTF guidelines and agreed to schedule her next follow up appointment today.  I, Remi Deter, CMA, am acting as transcriptionist for Dennard Nip, MD  I have reviewed the above documentation for accuracy and completeness, and I agree with the above. -Dennard Nip, MD

## 2017-12-04 NOTE — Telephone Encounter (Signed)
Sent the pt a mychart message. Mikaiya Tramble, CMA

## 2017-12-09 ENCOUNTER — Encounter (INDEPENDENT_AMBULATORY_CARE_PROVIDER_SITE_OTHER): Payer: Self-pay | Admitting: Family Medicine

## 2017-12-13 ENCOUNTER — Encounter: Payer: Self-pay | Admitting: Pediatrics

## 2017-12-13 ENCOUNTER — Ambulatory Visit: Payer: Medicaid Other | Admitting: Pediatrics

## 2017-12-13 VITALS — BP 123/80 | HR 95 | Temp 97.2°F | Ht 64.0 in | Wt 278.0 lb

## 2017-12-13 DIAGNOSIS — J029 Acute pharyngitis, unspecified: Secondary | ICD-10-CM

## 2017-12-13 DIAGNOSIS — M25473 Effusion, unspecified ankle: Secondary | ICD-10-CM | POA: Diagnosis not present

## 2017-12-13 DIAGNOSIS — J069 Acute upper respiratory infection, unspecified: Secondary | ICD-10-CM

## 2017-12-13 MED ORDER — HYDROCHLOROTHIAZIDE 12.5 MG PO CAPS: 12.5000 mg | ORAL_CAPSULE | Freq: Every day | ORAL | 1 refills | Status: DC

## 2017-12-13 NOTE — Patient Instructions (Signed)

## 2017-12-13 NOTE — Progress Notes (Signed)
  Subjective:   Patient ID: Kelly Fry, female    DOB: 09-Feb-1986, 32 y.o.   MRN: [id number] CC: Sore Throat (x 4 days with worsening over last 2 days. Has used lozenges but hasn't taken anything OTC.); Cough (no tightness or wheezing. Hasn't used albuterol inhaler. ); and Otalgia (bilateral)  HPI: Kelly Fry is a 32 y.o. female   Not coughing much. Does have h/o intermittent asthma. No fevers. Appetite down some. Sore throat bothering her the most. Daughter recently with fever, vomiting. Pt with no abd pain/stomach symptoms.  Started on HCTZ several months ago for leg swelling, has been helping.   Relevant past medical, surgical, family and social history reviewed. Allergies and medications reviewed and updated. Social History   Tobacco Use  Smoking Status Never Smoker  Smokeless Tobacco Never Used   ROS: Per HPI   Objective:    BP 123/80 (BP Location: Right Wrist, Patient Position: Sitting, Cuff Size: Normal)   Pulse 95   Temp (!) 97.2 F (36.2 C) (Oral)   Ht 5\' 4"  (1.626 m)   Wt 278 lb (126.1 kg)   BMI 47.72 kg/m   Wt Readings from Last 3 Encounters:  12/13/17 278 lb (126.1 kg)  12/02/17 277 lb (125.6 kg)  11/13/17 278 lb (126.1 kg)    Gen: NAD, alert, cooperative with exam, NCAT EYES: EOMI, no conjunctival injection, or no icterus ENT:  TMs pearly gray b/l, R TM with small layering white effusion, OP with erythema LYMPH: no cervical LAD CV: NRRR, normal S1/S2, no murmur, distal pulses 2+ b/l Resp: CTABL, no wheezes, normal WOB Abd: +BS, soft, NTND. no guarding or organomegaly Ext: No edema, warm Neuro: Alert and oriented, strength equal b/l UE and LE, coordination grossly normal MSK: normal muscle bulk  Assessment & Plan:  Kelly Fry was seen today for sore throat, cough and otalgia.  Diagnoses and all orders for this visit:  Acute URI Rapid strep negative.  Symptom care and return precautions discussed.  We will follow-up culture  Sore throat -      Rapid Strep Screen ([facility] ONLY) -     Culture, Group A Strep  Ankle swelling, unspecified laterality Stable, continue below.  -     hydrochlorothiazide (MICROZIDE) 12.5 MG capsule; Take 1 capsule (12.5 mg total) by mouth daily.   Follow up plan: Return in about 3 months (around 03/15/2018). Assunta Found, MD [place]

## 2017-12-15 LAB — RAPID STREP SCREEN (MED CTR MEBANE ONLY): STREP GP A AG, IA W/REFLEX: NEGATIVE

## 2017-12-15 LAB — CULTURE, GROUP A STREP

## 2017-12-16 LAB — CULTURE, GROUP A STREP

## 2017-12-17 ENCOUNTER — Other Ambulatory Visit: Payer: Self-pay

## 2017-12-17 MED ORDER — AMOXICILLIN 500 MG PO CAPS: 500.0000 mg | ORAL_CAPSULE | Freq: Two times a day (BID) | ORAL | 0 refills | Status: DC

## 2017-12-22 ENCOUNTER — Ambulatory Visit (INDEPENDENT_AMBULATORY_CARE_PROVIDER_SITE_OTHER): Payer: Medicaid Other | Admitting: Family Medicine

## 2017-12-22 VITALS — BP 131/83 | HR 80 | Temp 98.2°F | Ht 64.0 in | Wt 277.0 lb

## 2017-12-22 DIAGNOSIS — E559 Vitamin D deficiency, unspecified: Secondary | ICD-10-CM | POA: Diagnosis not present

## 2017-12-22 DIAGNOSIS — E8881 Metabolic syndrome: Secondary | ICD-10-CM | POA: Diagnosis not present

## 2017-12-22 DIAGNOSIS — Z6841 Body Mass Index (BMI) 40.0 and over, adult: Secondary | ICD-10-CM | POA: Diagnosis not present

## 2017-12-22 DIAGNOSIS — E88819 Insulin resistance, unspecified: Secondary | ICD-10-CM

## 2017-12-22 MED ORDER — VITAMIN D (ERGOCALCIFEROL) 1.25 MG (50000 UNIT) PO CAPS: 50000.0000 [IU] | ORAL_CAPSULE | ORAL | 0 refills | Status: DC

## 2017-12-23 ENCOUNTER — Other Ambulatory Visit (INDEPENDENT_AMBULATORY_CARE_PROVIDER_SITE_OTHER): Payer: Self-pay | Admitting: Family Medicine

## 2017-12-23 ENCOUNTER — Encounter (INDEPENDENT_AMBULATORY_CARE_PROVIDER_SITE_OTHER): Payer: Self-pay | Admitting: Family Medicine

## 2017-12-23 DIAGNOSIS — E8881 Metabolic syndrome: Secondary | ICD-10-CM

## 2017-12-23 MED ORDER — METFORMIN HCL 500 MG PO TABS: 500.0000 mg | ORAL_TABLET | Freq: Every day | ORAL | 0 refills | Status: DC

## 2017-12-23 NOTE — Progress Notes (Signed)
Office: [phone]  /  Fax: [phone]   HPI:   Chief Complaint: OBESITY Kelly Fry is here to discuss her progress with her obesity treatment plan. She is on the Category 2 plan and is following her eating plan approximately 0 % of the time. She states she is exercising 0 minutes 0 times per week. Kelly Fry is not following the plan well with a recent upper respiratory infection, mostly just following portion control and smart choices. She is ready to get back on track, but is bored with the Category 2 plan.  Her weight is 277 lb (125.6 kg) today and has not lost weight since her last visit. She has lost 16 lbs since starting treatment with [place].  Vitamin D deficiency Kelly Fry has a diagnosis of vitamin D deficiency. She is currently taking vit D and stable, but not yet at goal. She denies nausea, vomiting, or muscle weakness.  ALLERGIES: Allergies  Allergen Reactions  . Latuda [Lurasidone] Other (See Comments)    Shaky; worsened mood    MEDICATIONS: Current Outpatient Medications on File Prior to Visit  Medication Sig Dispense Refill  . albuterol (PROVENTIL HFA;VENTOLIN HFA) 108 (90 Base) MCG/ACT inhaler Inhale 2 puffs into the lungs every 6 (six) hours as needed for wheezing or shortness of breath. 1 Inhaler 2  . amoxicillin (AMOXIL) 500 MG capsule Take 1 capsule (500 mg total) by mouth 2 (two) times daily. 20 capsule 0  . buPROPion (WELLBUTRIN SR) 150 MG 12 hr tablet Take 1 tablet (150 mg total) by mouth daily. 30 tablet 0  . hydrochlorothiazide (MICROZIDE) 12.5 MG capsule Take 1 capsule (12.5 mg total) by mouth daily. 90 capsule 1  . metFORMIN (GLUCOPHAGE) 500 MG tablet Take 1 tablet (500 mg total) by mouth daily with breakfast. 30 tablet 0   No current facility-administered medications on file prior to visit.     PAST MEDICAL HISTORY: Past Medical History:  Diagnosis Date  . Ankle instability, right   . Anxiety   . Back pain   . Bipolar disorder ([place])   . Complication of  anesthesia   . Depression   . Dyspnea   . Family history of adverse reaction to anesthesia    Dad had n/v post anesthesia  . GERD (gastroesophageal reflux disease)   . Leg edema   . Medical history non-contributory   . Miscarriage   . PONV (postoperative nausea and vomiting)   . Vitamin D deficiency     PAST SURGICAL HISTORY: Past Surgical History:  Procedure Laterality Date  . ANKLE RECONSTRUCTION Right 02/21/2017   Procedure: RIGHT LATERAL ANKLE RECONSTRUCTION WITH SPLIT PERONEAL LONGUS TENDON;  Surgeon: Marybelle Killings, MD;  Location: [facility];  Service: Orthopedics;  Laterality: Right;  . NO PAST SURGERIES    . WISDOM TOOTH EXTRACTION  04/12/2015    SOCIAL HISTORY: Social History   Tobacco Use  . Smoking status: Never Smoker  . Smokeless tobacco: Never Used  Substance Use Topics  . Alcohol use: No  . Drug use: No    FAMILY HISTORY: Family History  Problem Relation Age of Onset  . Kidney disease Father   . COPD Father   . Hypertension Father   . Depression Father   . Sleep apnea Father   . Obesity Father   . Kidney disease Sister   . Asthma Sister   . Early death Mother   . Stroke Mother   . Heart disease Mother   . Hypertension Mother   . Alcoholism Mother   .  Asthma Brother     ROS: Review of Systems  Constitutional: Negative for weight loss.  Gastrointestinal: Negative for nausea and vomiting.  Musculoskeletal:       Negative for muscle weakness.    PHYSICAL EXAM: Blood pressure 131/83, pulse 80, temperature 98.2 F (36.8 C), temperature source Oral, height 5\' 4"  (1.626 m), weight 277 lb (125.6 kg), SpO2 99 %. Body mass index is 47.55 kg/m. Physical Exam  Constitutional: She is oriented to person, place, and time. She appears well-developed and well-nourished.  Cardiovascular: Normal rate.  Pulmonary/Chest: Effort normal.  Musculoskeletal: Normal range of motion.  Neurological: She is oriented to person, place, and time.  Skin: Skin is warm and  dry.  Psychiatric: She has a normal mood and affect. Her behavior is normal.  Vitals reviewed.   RECENT LABS AND TESTS: BMET    Component Value Date/Time   NA 138 12/02/2017 1254   K 3.8 12/02/2017 1254   CL 100 12/02/2017 1254   CO2 20 12/02/2017 1254   GLUCOSE 75 12/02/2017 1254   GLUCOSE 91 02/19/2017 0935   GLUCOSE 72 08/30/2013 1342   BUN 12 12/02/2017 1254   CREATININE 0.94 12/02/2017 1254   CREATININE 0.73 11/09/2013 1509   CALCIUM 9.5 12/02/2017 1254   GFRNONAA 82 12/02/2017 1254   GFRAA 94 12/02/2017 1254   Lab Results  Component Value Date   HGBA1C 5.0 12/02/2017   HGBA1C 5.2 09/02/2017   Lab Results  Component Value Date   INSULIN 20.3 12/02/2017   INSULIN 42.7 (H) 09/02/2017   CBC    Component Value Date/Time   WBC 7.3 09/02/2017 0942   WBC 7.6 02/19/2017 0935   RBC 5.18 09/02/2017 0942   RBC 4.95 02/19/2017 0935   HGB 15.3 09/02/2017 0942   HCT 45.5 09/02/2017 0942   PLT 247 02/19/2017 0935   PLT 240 04/18/2016 0832   MCV 88 09/02/2017 0942   MCH 29.5 09/02/2017 0942   MCH 28.5 02/19/2017 0935   MCHC 33.6 09/02/2017 0942   MCHC 32.6 02/19/2017 0935   RDW 13.3 09/02/2017 0942   LYMPHSABS 1.5 09/02/2017 0942   MONOABS 0.4 06/01/2013 0956   EOSABS 0.1 09/02/2017 0942   BASOSABS 0.0 09/02/2017 0942   Iron/TIBC/Ferritin/ %Sat    Component Value Date/Time   IRON 101 06/30/2015 1007   TIBC 360 06/30/2015 1007   FERRITIN 56 06/30/2015 1007   IRONPCTSAT 28 06/30/2015 1007   Lipid Panel     Component Value Date/Time   CHOL 168 12/02/2017 1254   TRIG 108 12/02/2017 1254   HDL 39 (L) 12/02/2017 1254   CHOLHDL 3.5 06/30/2015 1007   LDLCALC 107 (H) 12/02/2017 1254   LDLDIRECT 118 (H) 06/24/2017 1633   Hepatic Function Panel     Component Value Date/Time   PROT 7.5 12/02/2017 1254   ALBUMIN 4.8 12/02/2017 1254   AST 18 12/02/2017 1254   ALT 21 12/02/2017 1254   ALKPHOS 95 12/02/2017 1254   BILITOT 0.4 12/02/2017 1254      Component  Value Date/Time   TSH 2.760 09/02/2017 0942   TSH 2.880 06/24/2017 1633   TSH 2.740 06/30/2015 1007   Results for Kelly, Fry (MRN [id number]) as of 12/23/2017 12:40  Ref. Range 12/02/2017 12:54  Vitamin D, 25-Hydroxy Latest Ref Range: 30.0 - 100.0 ng/mL 35.4   ASSESSMENT AND PLAN: Vitamin D deficiency - Plan: Vitamin D, Ergocalciferol, (DRISDOL) 1.25 MG (50000 UT) CAPS capsule  Class 3 severe obesity with serious comorbidity and  body mass index (BMI) of 45.0 to 49.9 in adult, unspecified obesity type ([place])  PLAN:  Vitamin D Deficiency Kelly Fry was informed that low vitamin D levels contributes to fatigue and are associated with obesity, breast, and colon cancer. She agrees to continue to take prescription Vit D @50 ,000 IU every week #4 with no refills and will follow up for routine testing of vitamin D, at least 2-3 times per year. She was informed of the risk of over-replacement of vitamin D and agrees to not increase her dose unless she discusses this with [place] first. Kelly Fry agrees to follow up in 3 weeks.  I spent > than 50% of the 25 minute visit on counseling as documented in the note.  Obesity Kelly Fry is currently in the action stage of change. As such, her goal is to continue with weight loss efforts. She has agreed to change to keeping a food journal with 1100 to 1350 calories and 75 grams of protein daily. Kelly Fry has been instructed to work up to a goal of 150 minutes of combined cardio and strengthening exercise per week for weight loss and overall health benefits. We discussed the following Behavioral Modification Strategies today: increasing lean protein intake, decreasing simple carbohydrates, work on meal planning and easy cooking plans, and holiday eating strategies.   Kelly Fry has agreed to follow up with our clinic in 3 weeks. She was informed of the importance of frequent follow up visits to maximize her success with intensive lifestyle modifications for her multiple  health conditions.   OBESITY BEHAVIORAL INTERVENTION VISIT  Today's visit was # 7   Starting weight: 293 lbs Starting date: 09/02/17 Today's weight : Weight: 277 lb (125.6 kg)  Today's date: 12/22/2017 Total lbs lost to date: 16  ASK: We discussed the diagnosis of obesity with Kelly Fry today and Kelly Fry agreed to give [place] permission to discuss obesity behavioral modification therapy today.  ASSESS: Kelly Fry has the diagnosis of obesity and her BMI today is 47.52. Kelly Fry is in the action stage of change   ADVISE: Kelly Fry was educated on the multiple health risks of obesity as well as the benefit of weight loss to improve her health. She was advised of the need for long term treatment and the importance of lifestyle modifications to improve her current health and to decrease her risk of future health problems.  AGREE: Multiple dietary modification options and treatment options were discussed and Kelly Fry agreed to follow the recommendations documented in the above note.  ARRANGE: Kelly Fry was educated on the importance of frequent visits to treat obesity as outlined per CMS and USPSTF guidelines and agreed to schedule her next follow up appointment today.  I, Marcille Blanco, am acting as transcriptionist for Starlyn Skeans, MD  I have reviewed the above documentation for accuracy and completeness, and I agree with the above. -Dennard Nip, MD

## 2017-12-25 ENCOUNTER — Encounter (INDEPENDENT_AMBULATORY_CARE_PROVIDER_SITE_OTHER): Payer: Self-pay | Admitting: Family Medicine

## 2018-01-14 ENCOUNTER — Encounter (INDEPENDENT_AMBULATORY_CARE_PROVIDER_SITE_OTHER): Payer: Self-pay

## 2018-01-14 ENCOUNTER — Ambulatory Visit (INDEPENDENT_AMBULATORY_CARE_PROVIDER_SITE_OTHER): Payer: Medicaid Other | Admitting: Family Medicine

## 2018-01-19 ENCOUNTER — Ambulatory Visit (INDEPENDENT_AMBULATORY_CARE_PROVIDER_SITE_OTHER): Payer: Medicaid Other | Admitting: Family Medicine

## 2018-01-19 ENCOUNTER — Encounter (INDEPENDENT_AMBULATORY_CARE_PROVIDER_SITE_OTHER): Payer: Self-pay | Admitting: Family Medicine

## 2018-01-19 VITALS — BP 126/88 | HR 86 | Temp 98.0°F | Ht 64.0 in | Wt 276.0 lb

## 2018-01-19 DIAGNOSIS — Z6841 Body Mass Index (BMI) 40.0 and over, adult: Secondary | ICD-10-CM

## 2018-01-19 DIAGNOSIS — E559 Vitamin D deficiency, unspecified: Secondary | ICD-10-CM | POA: Diagnosis not present

## 2018-01-19 DIAGNOSIS — E8881 Metabolic syndrome: Secondary | ICD-10-CM

## 2018-01-19 DIAGNOSIS — E66813 Obesity, class 3: Secondary | ICD-10-CM

## 2018-01-19 DIAGNOSIS — Z9189 Other specified personal risk factors, not elsewhere classified: Secondary | ICD-10-CM

## 2018-01-19 DIAGNOSIS — E88819 Insulin resistance, unspecified: Secondary | ICD-10-CM

## 2018-01-19 DIAGNOSIS — F3289 Other specified depressive episodes: Secondary | ICD-10-CM

## 2018-01-19 MED ORDER — BUPROPION HCL ER (SR) 150 MG PO TB12: 150.0000 mg | ORAL_TABLET | Freq: Every day | ORAL | 0 refills | Status: DC

## 2018-01-19 MED ORDER — VITAMIN D (ERGOCALCIFEROL) 1.25 MG (50000 UNIT) PO CAPS: 50000.0000 [IU] | ORAL_CAPSULE | ORAL | 0 refills | Status: DC

## 2018-01-19 MED ORDER — METFORMIN HCL 500 MG PO TABS: 500.0000 mg | ORAL_TABLET | Freq: Every day | ORAL | 0 refills | Status: DC

## 2018-01-20 NOTE — Progress Notes (Signed)
Office: [phone]  /  Fax: [phone]   HPI:   Chief Complaint: OBESITY Kelly Fry is here to discuss her progress with her obesity treatment plan. She is on the keep a food journal with 1100-1350 calories and 75 grams of protein daily and is following her eating plan approximately 60 % of the time. She states she is exercising 0 minutes 0 times per week. Sherron continues to do well with weight loss. She was given the option to journal last visit but she feels she does better with structure.  Her weight is 276 lb (125.2 kg) today and has had a weight loss of 1 pounds over a period of 4 weeks since her last visit. She has lost 17 lbs since starting treatment with [place].  Insulin Resistance Kelly Fry has a diagnosis of insulin resistance based on her elevated fasting insulin level >5. Although Kelly Fry's blood glucose readings are still under good control, insulin resistance puts her at greater risk of metabolic syndrome and diabetes. She is stable on metformin and denies nausea, vomiting, or hypoglycemia. She is doing well on diet to decrease risk of diabetes.  At risk for diabetes Kelly Fry is at higher than average risk for developing diabetes due to her obesity and insulin resistance. She currently denies polyuria or polydipsia.  Vitamin D Deficiency Kelly Fry has a diagnosis of vitamin D deficiency. She is stable on prescription Vit D and denies nausea, vomiting or muscle weakness.  Depression with emotional eating behaviors Kelly Fry's mood is stable on Wellbutrin. She seems more in control of meal planning and blood pressure is stable. Kelly Fry struggles with emotional eating and using food for comfort to the extent that it is negatively impacting her health. She often snacks when she is not hungry. Kelly Fry sometimes feels she is out of control and then feels guilty that she made poor food choices. She has been working on behavior modification techniques to help reduce her emotional eating and has been  somewhat successful. She shows no sign of suicidal or homicidal ideations.  Depression screen Kelly Fry 09/02/2017 08/28/2017 07/15/2017 06/24/2017 06/14/2017  Decreased Interest 3 3 0 0 0  Down, Depressed, Hopeless 3 2 0 0 0  PHQ - 2 Score 6 5 0 0 0  Altered sleeping 3 2 - - -  Tired, decreased energy 3 3 - - -  Change in appetite 3 3 - - -  Feeling bad or failure about yourself  3 3 - - -  Trouble concentrating 3 2 - - -  Moving slowly or fidgety/restless 3 2 - - -  Suicidal thoughts 1 0 - - -  PHQ-9 Score 25 20 - - -  Difficult doing work/chores Very difficult - - - -  Some recent data might be hidden    ALLERGIES: Allergies  Allergen Reactions  . Latuda [Lurasidone] Other (See Comments)    Shaky; worsened mood    MEDICATIONS: Current Outpatient Medications on File Prior to Visit  Medication Sig Dispense Refill  . albuterol (PROVENTIL HFA;VENTOLIN HFA) 108 (90 Base) MCG/ACT inhaler Inhale 2 puffs into the lungs every 6 (six) hours as needed for wheezing or shortness of breath. 1 Inhaler 2  . hydrochlorothiazide (MICROZIDE) 12.5 MG capsule Take 1 capsule (12.5 mg total) by mouth daily. 90 capsule 1   No current facility-administered medications on file prior to visit.     PAST MEDICAL HISTORY: Past Medical History:  Diagnosis Date  . Ankle instability, right   . Anxiety   . Back  pain   . Bipolar disorder ([place])   . Complication of anesthesia   . Depression   . Dyspnea   . Family history of adverse reaction to anesthesia    Dad had n/v post anesthesia  . GERD (gastroesophageal reflux disease)   . Leg edema   . Medical history non-contributory   . Miscarriage   . PONV (postoperative nausea and vomiting)   . Vitamin D deficiency     PAST SURGICAL HISTORY: Past Surgical History:  Procedure Laterality Date  . ANKLE RECONSTRUCTION Right 02/21/2017   Procedure: RIGHT LATERAL ANKLE RECONSTRUCTION WITH SPLIT PERONEAL LONGUS TENDON;  Surgeon: Marybelle Killings, MD;  Location: [facility];  Service: Orthopedics;  Laterality: Right;  . NO PAST SURGERIES    . WISDOM TOOTH EXTRACTION  04/12/2015    SOCIAL HISTORY: Social History   Tobacco Use  . Smoking status: Never Smoker  . Smokeless tobacco: Never Used  Substance Use Topics  . Alcohol use: No  . Drug use: No    FAMILY HISTORY: Family History  Problem Relation Age of Onset  . Kidney disease Father   . COPD Father   . Hypertension Father   . Depression Father   . Sleep apnea Father   . Obesity Father   . Kidney disease Sister   . Asthma Sister   . Early death Mother   . Stroke Mother   . Heart disease Mother   . Hypertension Mother   . Alcoholism Mother   . Asthma Brother     ROS: Review of Systems  Constitutional: Positive for weight loss.  Gastrointestinal: Negative for nausea and vomiting.  Genitourinary: Negative for frequency.  Musculoskeletal:       Negative muscle weakness  Endo/Heme/Allergies: Negative for polydipsia.       Negative hypoglycemia  Psychiatric/Behavioral: Positive for depression. Negative for suicidal ideas.    PHYSICAL EXAM: Blood pressure 126/88, pulse 86, temperature 98 F (36.7 C), temperature source Oral, height 5\' 4"  (1.626 m), weight 276 lb (125.2 kg), SpO2 98 %. Body mass index is 47.38 kg/m. Physical Exam  Constitutional: She is oriented to person, place, and time. She appears well-developed and well-nourished.  Cardiovascular: Normal rate.  Pulmonary/Chest: Effort normal.  Musculoskeletal: Normal range of motion.  Neurological: She is oriented to person, place, and time.  Skin: Skin is warm and dry.  Psychiatric: She has a normal mood and affect. Her behavior is normal.  Vitals reviewed.   RECENT LABS AND TESTS: BMET    Component Value Date/Time   NA 138 12/02/2017 1254   K 3.8 12/02/2017 1254   CL 100 12/02/2017 1254   CO2 20 12/02/2017 1254   GLUCOSE 75 12/02/2017 1254   GLUCOSE 91 02/19/2017 0935   GLUCOSE 72 08/30/2013 1342   BUN 12  12/02/2017 1254   CREATININE 0.94 12/02/2017 1254   CREATININE 0.73 11/09/2013 1509   CALCIUM 9.5 12/02/2017 1254   GFRNONAA 82 12/02/2017 1254   GFRAA 94 12/02/2017 1254   Lab Results  Component Value Date   HGBA1C 5.0 12/02/2017   HGBA1C 5.2 09/02/2017   Lab Results  Component Value Date   INSULIN 20.3 12/02/2017   INSULIN 42.7 (H) 09/02/2017   CBC    Component Value Date/Time   WBC 7.3 09/02/2017 0942   WBC 7.6 02/19/2017 0935   RBC 5.18 09/02/2017 0942   RBC 4.95 02/19/2017 0935   HGB 15.3 09/02/2017 0942   HCT 45.5 09/02/2017 0942   PLT 247 02/19/2017  0935   PLT 240 04/18/2016 0832   MCV 88 09/02/2017 0942   MCH 29.5 09/02/2017 0942   MCH 28.5 02/19/2017 0935   MCHC 33.6 09/02/2017 0942   MCHC 32.6 02/19/2017 0935   RDW 13.3 09/02/2017 0942   LYMPHSABS 1.5 09/02/2017 0942   MONOABS 0.4 06/01/2013 0956   EOSABS 0.1 09/02/2017 0942   BASOSABS 0.0 09/02/2017 0942   Iron/TIBC/Ferritin/ %Sat    Component Value Date/Time   IRON 101 06/30/2015 1007   TIBC 360 06/30/2015 1007   FERRITIN 56 06/30/2015 1007   IRONPCTSAT 28 06/30/2015 1007   Lipid Panel     Component Value Date/Time   CHOL 168 12/02/2017 1254   TRIG 108 12/02/2017 1254   HDL 39 (L) 12/02/2017 1254   CHOLHDL 3.5 06/30/2015 1007   LDLCALC 107 (H) 12/02/2017 1254   LDLDIRECT 118 (H) 06/24/2017 1633   Hepatic Function Panel     Component Value Date/Time   PROT 7.5 12/02/2017 1254   ALBUMIN 4.8 12/02/2017 1254   AST 18 12/02/2017 1254   ALT 21 12/02/2017 1254   ALKPHOS 95 12/02/2017 1254   BILITOT 0.4 12/02/2017 1254      Component Value Date/Time   TSH 2.760 09/02/2017 0942   TSH 2.880 06/24/2017 1633   TSH 2.740 06/30/2015 1007  Results for FEATHER, BERRIE (MRN [id number]) as of 01/20/2018 14:24  Ref. Range 12/02/2017 12:54  Vitamin D, 25-Hydroxy Latest Ref Range: 30.0 - 100.0 ng/mL 35.4    ASSESSMENT AND PLAN: Insulin resistance - Plan: metFORMIN (GLUCOPHAGE) 500 MG  tablet  Vitamin D deficiency - Plan: Vitamin D, Ergocalciferol, (DRISDOL) 1.25 MG (50000 UT) CAPS capsule  Other depression - with emotional eating - Plan: buPROPion (WELLBUTRIN SR) 150 MG 12 hr tablet  At risk for diabetes mellitus  Class 3 severe obesity with serious comorbidity and body mass index (BMI) of 45.0 to 49.9 in adult, unspecified obesity type ([place])  PLAN:  Insulin Resistance Kaila will continue to work on weight loss, exercise, and decreasing simple carbohydrates in her diet to help decrease the risk of diabetes. We dicussed metformin including benefits and risks. She was informed that eating too many simple carbohydrates or too many calories at one sitting increases the likelihood of GI side effects. Alaze agrees to continue taking metformin 500 mg q AM #30 and we will refill for 1 month. Shevelle agrees to follow up with our clinic in 3 to 4 weeks as directed to monitor her progress.  Diabetes risk counselling Minela was given extended (15 minutes) diabetes prevention counseling today. She is 32 y.o. female and has risk factors for diabetes including obesity and insulin resistance. We discussed intensive lifestyle modifications today with an emphasis on weight loss as well as increasing exercise and decreasing simple carbohydrates in her diet.  Vitamin D Deficiency Shewanda was informed that low vitamin D levels contributes to fatigue and are associated with obesity, breast, and colon cancer. Lovelee agrees to continue taking prescription Vit D @50 ,000 IU every week #4 and we will refill for 1 month. She will follow up for routine testing of vitamin D, at least 2-3 times per year. She was informed of the risk of over-replacement of vitamin D and agrees to not increase her dose unless she discusses this with [place] first. Chelci agrees to follow up with our clinic in 3 to 4 weeks.  Depression with Emotional Eating Behaviors We discussed behavior modification techniques today to help  Carmell deal with her emotional eating  and depression. Audery agrees to continue taking Wellbutrin SR 150 mg qd #30 and we will refill for 1 month. Shyonna agrees to follow up with our clinic in 3 to 4 weeks.  Obesity Hayli is currently in the action stage of change. As such, her goal is to continue with weight loss efforts She has agreed to follow the Category 2 plan Vika has been instructed to work up to a goal of 150 minutes of combined cardio and strengthening exercise per week for weight loss and overall health benefits. We discussed the following Behavioral Modification Strategies today: increasing lean protein intake, work on meal planning and easy cooking plans, holiday eating strategies, emotional eating strategies, and celebration eating strategies   Maneh has agreed to follow up with our clinic in 3 to 4 weeks. She was informed of the importance of frequent follow up visits to maximize her success with intensive lifestyle modifications for her multiple health conditions.   OBESITY BEHAVIORAL INTERVENTION VISIT  Today's visit was # 8   Starting weight: 293 lbs Starting date: 09/02/17 Today's weight : 276 lbs Today's date: 01/19/2018 Total lbs lost to date: 17    ASK: We discussed the diagnosis of obesity with [name] today and Aldona Lento agreed to give [place] permission to discuss obesity behavioral modification therapy today.  ASSESS: Wynona has the diagnosis of obesity and her BMI today is 47.35 Sybel is in the action stage of change   ADVISE: Kamisha was educated on the multiple health risks of obesity as well as the benefit of weight loss to improve her health. She was advised of the need for long term treatment and the importance of lifestyle modifications to improve her current health and to decrease her risk of future health problems.  AGREE: Multiple dietary modification options and treatment options were discussed and  Tristian agreed to follow the  recommendations documented in the above note.  ARRANGE: Eilah was educated on the importance of frequent visits to treat obesity as outlined per CMS and USPSTF guidelines and agreed to schedule her next follow up appointment today.  I, Trixie Dredge, am acting as transcriptionist for Dennard Nip, MD  I have reviewed the above documentation for accuracy and completeness, and I agree with the above. -Dennard Nip, MD

## 2018-02-07 DIAGNOSIS — R52 Pain, unspecified: Secondary | ICD-10-CM | POA: Diagnosis not present

## 2018-02-07 DIAGNOSIS — I10 Essential (primary) hypertension: Secondary | ICD-10-CM | POA: Diagnosis not present

## 2018-02-16 ENCOUNTER — Encounter (INDEPENDENT_AMBULATORY_CARE_PROVIDER_SITE_OTHER): Payer: Self-pay | Admitting: Family Medicine

## 2018-02-16 ENCOUNTER — Ambulatory Visit (INDEPENDENT_AMBULATORY_CARE_PROVIDER_SITE_OTHER): Payer: Medicaid Other | Admitting: Family Medicine

## 2018-02-16 VITALS — BP 125/82 | HR 73 | Temp 98.1°F | Ht 64.0 in | Wt 269.0 lb

## 2018-02-16 DIAGNOSIS — Z6841 Body Mass Index (BMI) 40.0 and over, adult: Secondary | ICD-10-CM | POA: Diagnosis not present

## 2018-02-16 DIAGNOSIS — E8881 Metabolic syndrome: Secondary | ICD-10-CM | POA: Diagnosis not present

## 2018-02-16 MED ORDER — METFORMIN HCL 500 MG PO TABS: 500.0000 mg | ORAL_TABLET | Freq: Every day | ORAL | 0 refills | Status: DC

## 2018-02-16 NOTE — Progress Notes (Signed)
Office: [phone]  /  Fax: [phone]   HPI:   Chief Complaint: OBESITY Kelly Fry is here to discuss her progress with her obesity treatment plan. She is on the Category 2 plan and is following her eating plan approximately 75 % of the time. She states she is walking more than 7,000 steps 5 times per week. Kelly Fry has done very well with weight loss, even over the holidays. She notes improved support from family and feels this is helping. Her hunger is controlled.  Her weight is 269 lb (122 kg) today and has had a weight loss of 7 pounds over a period of 4 weeks since her last visit. She has lost 24 lbs since starting treatment with [place].  Insulin Resistance Kelly Fry has a diagnosis of insulin resistance based on her elevated fasting insulin level >5. Although Kelly Fry's blood glucose readings are still under good control, insulin resistance puts her at greater risk of metabolic syndrome and diabetes. She did well with diet and metformin, even over Christmas and continues to work on diet and exercise to decrease risk of diabetes. She notes a decrease in polyphagia and feels decreased edema.  ASSESSMENT AND PLAN:  Insulin resistance - Plan: metFORMIN (GLUCOPHAGE) 500 MG tablet  Class 3 severe obesity with serious comorbidity and body mass index (BMI) of 45.0 to 49.9 in adult, unspecified obesity type ([place])  PLAN:  Insulin Resistance Kelly Fry will continue to work on weight loss, exercise, and decreasing simple carbohydrates in her diet to help decrease the risk of diabetes. She was informed that eating too many simple carbohydrates or too many calories at one sitting increases the likelihood of GI side effects. Kelly Fry agreed to continue her diet and to take metformin 500mg  with breakfast #30 with no refills and prescription was written today. Kelly Fry agreed to follow up with [place] as directed to monitor her progress.  Obesity Kelly Fry is currently in the action stage of change. As such, her goal is  to continue with weight loss efforts. She has agreed to follow the Category 2 plan. Kelly Fry has been instructed to work up to a goal of 150 minutes of combined cardio and strengthening exercise per week for weight loss and overall health benefits. We discussed the following Behavioral Modification Stratagies today: increasing lean protein intake, decreasing simple carbohydrates, and work on meal planning and easy cooking plans.  Kelly Fry has agreed to follow up with our clinic in 2 to 3 weeks for a fasting appointment. She was informed of the importance of frequent follow up visits to maximize her success with intensive lifestyle modifications for her multiple health conditions.  ALLERGIES: Allergies  Allergen Reactions  . Latuda [Lurasidone] Other (See Comments)    Shaky; worsened mood    MEDICATIONS: Current Outpatient Medications on File Prior to Visit  Medication Sig Dispense Refill  . albuterol (PROVENTIL HFA;VENTOLIN HFA) 108 (90 Base) MCG/ACT inhaler Inhale 2 puffs into the lungs every 6 (six) hours as needed for wheezing or shortness of breath. 1 Inhaler 2  . buPROPion (WELLBUTRIN SR) 150 MG 12 hr tablet Take 1 tablet (150 mg total) by mouth daily. 30 tablet 0  . hydrochlorothiazide (MICROZIDE) 12.5 MG capsule Take 1 capsule (12.5 mg total) by mouth daily. 90 capsule 1  . Vitamin D, Ergocalciferol, (DRISDOL) 1.25 MG (50000 UT) CAPS capsule Take 1 capsule (50,000 Units total) by mouth every 7 (seven) days. 4 capsule 0   No current facility-administered medications on file prior to visit.     PAST  MEDICAL HISTORY: Past Medical History:  Diagnosis Date  . Ankle instability, right   . Anxiety   . Back pain   . Bipolar disorder ([place])   . Complication of anesthesia   . Depression   . Dyspnea   . Family history of adverse reaction to anesthesia    Dad had n/v post anesthesia  . GERD (gastroesophageal reflux disease)   . Leg edema   . Medical history non-contributory   .  Miscarriage   . PONV (postoperative nausea and vomiting)   . Vitamin D deficiency     PAST SURGICAL HISTORY: Past Surgical History:  Procedure Laterality Date  . ANKLE RECONSTRUCTION Right 02/21/2017   Procedure: RIGHT LATERAL ANKLE RECONSTRUCTION WITH SPLIT PERONEAL LONGUS TENDON;  Surgeon: Marybelle Killings, MD;  Location: [facility];  Service: Orthopedics;  Laterality: Right;  . NO PAST SURGERIES    . WISDOM TOOTH EXTRACTION  04/12/2015    SOCIAL HISTORY: Social History   Tobacco Use  . Smoking status: Never Smoker  . Smokeless tobacco: Never Used  Substance Use Topics  . Alcohol use: No  . Drug use: No    FAMILY HISTORY: Family History  Problem Relation Age of Onset  . Kidney disease Father   . COPD Father   . Hypertension Father   . Depression Father   . Sleep apnea Father   . Obesity Father   . Kidney disease Sister   . Asthma Sister   . Early death Mother   . Stroke Mother   . Heart disease Mother   . Hypertension Mother   . Alcoholism Mother   . Asthma Brother     ROS: Review of Systems  Constitutional: Positive for weight loss.  Cardiovascular:       Positive for edema.  Endo/Heme/Allergies:       Positive for polyphagia.   PHYSICAL EXAM: Blood pressure 125/82, pulse 73, temperature 98.1 F (36.7 C), temperature source Oral, height 5\' 4"  (1.626 m), weight 269 lb (122 kg), SpO2 98 %. Body mass index is 46.17 kg/m. Physical Exam Vitals signs reviewed.  Constitutional:      Appearance: Normal appearance. She is obese.  Cardiovascular:     Rate and Rhythm: Normal rate.  Pulmonary:     Effort: Pulmonary effort is normal.  Musculoskeletal: Normal range of motion.  Skin:    General: Skin is warm and dry.  Neurological:     Mental Status: She is alert and oriented to person, place, and time.  Psychiatric:        Mood and Affect: Mood normal.        Behavior: Behavior normal.    RECENT LABS AND TESTS: BMET    Component Value Date/Time   NA 138  12/02/2017 1254   K 3.8 12/02/2017 1254   CL 100 12/02/2017 1254   CO2 20 12/02/2017 1254   GLUCOSE 75 12/02/2017 1254   GLUCOSE 91 02/19/2017 0935   GLUCOSE 72 08/30/2013 1342   BUN 12 12/02/2017 1254   CREATININE 0.94 12/02/2017 1254   CREATININE 0.73 11/09/2013 1509   CALCIUM 9.5 12/02/2017 1254   GFRNONAA 82 12/02/2017 1254   GFRAA 94 12/02/2017 1254   Lab Results  Component Value Date   HGBA1C 5.0 12/02/2017   HGBA1C 5.2 09/02/2017   HGBA1C 5.0 04/18/2016   Lab Results  Component Value Date   INSULIN 20.3 12/02/2017   INSULIN 42.7 (H) 09/02/2017   CBC    Component Value Date/Time   WBC  7.3 09/02/2017 0942   WBC 7.6 02/19/2017 0935   RBC 5.18 09/02/2017 0942   RBC 4.95 02/19/2017 0935   HGB 15.3 09/02/2017 0942   HCT 45.5 09/02/2017 0942   PLT 247 02/19/2017 0935   PLT 240 04/18/2016 0832   MCV 88 09/02/2017 0942   MCH 29.5 09/02/2017 0942   MCH 28.5 02/19/2017 0935   MCHC 33.6 09/02/2017 0942   MCHC 32.6 02/19/2017 0935   RDW 13.3 09/02/2017 0942   LYMPHSABS 1.5 09/02/2017 0942   MONOABS 0.4 06/01/2013 0956   EOSABS 0.1 09/02/2017 0942   BASOSABS 0.0 09/02/2017 0942   Iron/TIBC/Ferritin/ %Sat    Component Value Date/Time   IRON 101 06/30/2015 1007   TIBC 360 06/30/2015 1007   FERRITIN 56 06/30/2015 1007   IRONPCTSAT 28 06/30/2015 1007   Lipid Panel     Component Value Date/Time   CHOL 168 12/02/2017 1254   TRIG 108 12/02/2017 1254   HDL 39 (L) 12/02/2017 1254   CHOLHDL 3.5 06/30/2015 1007   LDLCALC 107 (H) 12/02/2017 1254   LDLDIRECT 118 (H) 06/24/2017 1633   Hepatic Function Panel     Component Value Date/Time   PROT 7.5 12/02/2017 1254   ALBUMIN 4.8 12/02/2017 1254   AST 18 12/02/2017 1254   ALT 21 12/02/2017 1254   ALKPHOS 95 12/02/2017 1254   BILITOT 0.4 12/02/2017 1254      Component Value Date/Time   TSH 2.760 09/02/2017 0942   TSH 2.880 06/24/2017 1633   TSH 2.740 06/30/2015 1007   Results for CARMINA, WALLE (MRN  [id number]) as of 02/16/2018 15:39  Ref. Range 12/02/2017 12:54  Vitamin D, 25-Hydroxy Latest Ref Range: 30.0 - 100.0 ng/mL 35.4    OBESITY BEHAVIORAL INTERVENTION VISIT  Today's visit was # 9   Starting weight: 293 lbs Starting date: 09/02/17 Today's weight : Weight: 269 lb (122 kg)  Today's date: 02/16/2018 Total lbs lost to date: 24  ASK: We discussed the diagnosis of obesity with [name] today and Aldona Lento agreed to give [place] permission to discuss obesity behavioral modification therapy today.  ASSESS: Curry has the diagnosis of obesity and her BMI today is 46.1. Dilara is in the action stage of change.   ADVISE: Laketra was educated on the multiple health risks of obesity as well as the benefit of weight loss to improve her health. She was advised of the need for long term treatment and the importance of lifestyle modifications to improve her current health and to decrease her risk of future health problems.  AGREE: Multiple dietary modification options and treatment options were discussed and Darneisha agreed to follow the recommendations documented in the above note.  ARRANGE: Nataliya was educated on the importance of frequent visits to treat obesity as outlined per CMS and USPSTF guidelines and agreed to schedule her next follow up appointment today.  I, Marcille Blanco, am acting as transcriptionist for Starlyn Skeans, MD I have reviewed the above documentation for accuracy and completeness, and I agree with the above. -Dennard Nip, MD

## 2018-02-19 DIAGNOSIS — H04123 Dry eye syndrome of bilateral lacrimal glands: Secondary | ICD-10-CM | POA: Diagnosis not present

## 2018-02-27 ENCOUNTER — Other Ambulatory Visit (INDEPENDENT_AMBULATORY_CARE_PROVIDER_SITE_OTHER): Payer: Self-pay | Admitting: Family Medicine

## 2018-02-27 DIAGNOSIS — F3289 Other specified depressive episodes: Secondary | ICD-10-CM

## 2018-02-27 DIAGNOSIS — E88819 Insulin resistance, unspecified: Secondary | ICD-10-CM

## 2018-02-27 DIAGNOSIS — E559 Vitamin D deficiency, unspecified: Secondary | ICD-10-CM

## 2018-02-27 DIAGNOSIS — E8881 Metabolic syndrome: Secondary | ICD-10-CM

## 2018-03-05 ENCOUNTER — Ambulatory Visit (INDEPENDENT_AMBULATORY_CARE_PROVIDER_SITE_OTHER): Payer: Medicaid Other | Admitting: Family Medicine

## 2018-03-05 ENCOUNTER — Encounter (INDEPENDENT_AMBULATORY_CARE_PROVIDER_SITE_OTHER): Payer: Self-pay | Admitting: Family Medicine

## 2018-03-05 VITALS — BP 124/81 | HR 72 | Ht 64.0 in | Wt 270.0 lb

## 2018-03-05 DIAGNOSIS — E8881 Metabolic syndrome: Secondary | ICD-10-CM | POA: Diagnosis not present

## 2018-03-05 DIAGNOSIS — Z6841 Body Mass Index (BMI) 40.0 and over, adult: Secondary | ICD-10-CM | POA: Diagnosis not present

## 2018-03-05 DIAGNOSIS — E559 Vitamin D deficiency, unspecified: Secondary | ICD-10-CM | POA: Diagnosis not present

## 2018-03-05 DIAGNOSIS — F3289 Other specified depressive episodes: Secondary | ICD-10-CM

## 2018-03-05 DIAGNOSIS — E7849 Other hyperlipidemia: Secondary | ICD-10-CM

## 2018-03-05 MED ORDER — METFORMIN HCL 500 MG PO TABS: 500.0000 mg | ORAL_TABLET | Freq: Every day | ORAL | 0 refills | Status: DC

## 2018-03-05 MED ORDER — VITAMIN D (ERGOCALCIFEROL) 1.25 MG (50000 UNIT) PO CAPS: 50000.0000 [IU] | ORAL_CAPSULE | ORAL | 0 refills | Status: DC

## 2018-03-05 MED ORDER — BUPROPION HCL ER (SR) 150 MG PO TB12: 150.0000 mg | ORAL_TABLET | Freq: Every day | ORAL | 0 refills | Status: DC

## 2018-03-05 NOTE — Progress Notes (Signed)
Office: [phone]  /  Fax: [phone]   HPI:   Chief Complaint: OBESITY Kelly Fry is here to discuss her progress with her obesity treatment plan. She is on the Category 2 plan and is following her eating plan approximately 50 % of the time. She states she is exercising 0 minutes 0 times per week. Kelly Fry has had a lot of family stressors over the last 3 weeks and meal planning has fallen by the wayside. Things are starting to settle down and she is ready to get back on track.  Her weight is 270 lb (122.5 kg) today and has had a weight gain of 1 pound over a period of 3 weeks since her last visit. She has lost 23 lbs since starting treatment with [place].  Vitamin D deficiency Kelly Fry has a diagnosis of vitamin D deficiency. She is currently stable on  vit D and she is due for labs today. She denies nausea, vomiting, or muscle weakness.  Insulin Resistance Kelly Fry has a diagnosis of insulin resistance based on her elevated fasting insulin level >5. Although Kelly Fry's blood glucose readings are still under good control, insulin resistance puts her at greater risk of metabolic syndrome and diabetes. She is improving on metformin and diet and she continues to work on diet and exercise to decrease risk of diabetes. She denies nausea, vomiting, and hypoglycemia.  Depression with emotional eating behaviors Kelly Fry's mood is stable and she is still struggling with emotional eating and using food for comfort to the extent that it is negatively impacting her health. She often snacks when she is not hungry. Jatoya sometimes feels she is out of control and then feels guilty that she made poor food choices. She has been working on behavior modification techniques to help reduce her emotional eating and has been somewhat successful. She shows no sign of suicidal or homicidal ideations.  Hyperlipidemia Kelly Fry has hyperlipidemia and has been attempting to improve her cholesterol levels with diet and intensive  lifestyle modification including a low saturated fat diet, exercise and weight loss. She is due for labs today. She denies any chest pain.  ASSESSMENT AND PLAN:  Vitamin D deficiency - Plan: VITAMIN D 25 Hydroxy (Vit-D Deficiency, Fractures), Vitamin D, Ergocalciferol, (DRISDOL) 1.25 MG (50000 UT) CAPS capsule  Insulin resistance - Plan: Hemoglobin A1c, Insulin, random, metFORMIN (GLUCOPHAGE) 500 MG tablet  Other hyperlipidemia - Plan: Comprehensive metabolic panel, Hemoglobin A1c, Insulin, random, Lipid Panel With LDL/HDL Ratio  Other depression - with emotional eating  - Plan: buPROPion (WELLBUTRIN SR) 150 MG 12 hr tablet  Class 3 severe obesity with serious comorbidity and body mass index (BMI) of 45.0 to 49.9 in adult, unspecified obesity type ([facility])  PLAN:  Vitamin D Deficiency Kelly Fry was informed that low vitamin D levels contributes to fatigue and are associated with obesity, breast, and colon cancer. She agrees to continue to take prescription Vit D @50 ,000 IU every week #4 with no refills and will follow up for routine testing of vitamin D, at least 2-3 times per year. She was informed of the risk of over-replacement of vitamin D and agrees to not increase her dose unless she discusses this with [place] first. Labs will be ordered today and Kelly Fry agrees to follow up in 3 weeks.  Insulin Resistance Kelly Fry will continue to work on weight loss, exercise, and decreasing simple carbohydrates in her diet to help decrease the risk of diabetes. She was informed that eating too many simple carbohydrates or too many calories at one  sitting increases the likelihood of GI side effects. Kelly Fry agreed to continue metformin 500mg  with breakfast qd #30 and no refills and prescription was written today. Kelly Fry agreed to follow up with [place] as directed to monitor her progress.  Hyperlipidemia Kelly Fry was informed of the American Heart Association Guidelines emphasizing intensive lifestyle modifications as the  first line treatment for hyperlipidemia. We discussed many lifestyle modifications today in depth, and Kelly Fry will continue to work on decreasing saturated fats such as fatty red meat, butter and many fried foods. She will also increase vegetables and lean protein in her diet and continue to work on exercise and weight loss efforts. We will order labs today and she agrees to continue with her diet. She will follow up at the agreed upon time.  Depression with Emotional Eating Behaviors We discussed behavior modification techniques today to help Kelly Fry deal with her emotional eating and depression. She has agreed to take Wellbutrin SR 150mg  qd #30 with no refills and agreed to follow up as directed.  Obesity Kelly Fry is currently in the action stage of change. As such, her goal is to continue with weight loss efforts. She has agreed to follow the Category 2 plan. Kelly Fry has been instructed to work up to a goal of 150 minutes of combined cardio and strengthening exercise per week for weight loss and overall health benefits. We discussed the following Behavioral Modification Strategies today: work on meal planning and easy cooking plans and dealing with family or coworker sabotage.  Kelly Fry has agreed to follow up with our clinic in 3 weeks. She was informed of the importance of frequent follow up visits to maximize her success with intensive lifestyle modifications for her multiple health conditions.  ALLERGIES: Allergies  Allergen Reactions  . Latuda [Lurasidone] Other (See Comments)    Shaky; worsened mood    MEDICATIONS: Current Outpatient Medications on File Prior to Visit  Medication Sig Dispense Refill  . albuterol (PROVENTIL HFA;VENTOLIN HFA) 108 (90 Base) MCG/ACT inhaler Inhale 2 puffs into the lungs every 6 (six) hours as needed for wheezing or shortness of breath. 1 Inhaler 2  . hydrochlorothiazide (MICROZIDE) 12.5 MG capsule Take 1 capsule (12.5 mg total) by mouth daily. 90 capsule 1    No current facility-administered medications on file prior to visit.     PAST MEDICAL HISTORY: Past Medical History:  Diagnosis Date  . Ankle instability, right   . Anxiety   . Back pain   . Bipolar disorder ([place])   . Complication of anesthesia   . Depression   . Dyspnea   . Family history of adverse reaction to anesthesia    Dad had n/v post anesthesia  . GERD (gastroesophageal reflux disease)   . Leg edema   . Medical history non-contributory   . Miscarriage   . PONV (postoperative nausea and vomiting)   . Vitamin D deficiency     PAST SURGICAL HISTORY: Past Surgical History:  Procedure Laterality Date  . ANKLE RECONSTRUCTION Right 02/21/2017   Procedure: RIGHT LATERAL ANKLE RECONSTRUCTION WITH SPLIT PERONEAL LONGUS TENDON;  Surgeon: Marybelle Killings, MD;  Location: [facility];  Service: Orthopedics;  Laterality: Right;  . NO PAST SURGERIES    . WISDOM TOOTH EXTRACTION  04/12/2015    SOCIAL HISTORY: Social History   Tobacco Use  . Smoking status: Never Smoker  . Smokeless tobacco: Never Used  Substance Use Topics  . Alcohol use: No  . Drug use: No    FAMILY HISTORY: Family History  Problem Relation Age of Onset  . Kidney disease Father   . COPD Father   . Hypertension Father   . Depression Father   . Sleep apnea Father   . Obesity Father   . Kidney disease Sister   . Asthma Sister   . Early death Mother   . Stroke Mother   . Heart disease Mother   . Hypertension Mother   . Alcoholism Mother   . Asthma Brother    ROS: Review of Systems  Constitutional: Negative for weight loss.  Cardiovascular: Negative for chest pain.  Gastrointestinal: Negative for nausea and vomiting.  Musculoskeletal:       Negative for muscle weakness.  Endo/Heme/Allergies:       Negative for hypoglycemia.  Psychiatric/Behavioral: Positive for depression.   PHYSICAL EXAM: Blood pressure 124/81, pulse 72, height 5\' 4"  (1.626 m), weight 270 lb (122.5 kg), SpO2 98 %. Body mass  index is 46.35 kg/m. Physical Exam Vitals signs reviewed.  Constitutional:      Appearance: Normal appearance. She is obese.  Cardiovascular:     Rate and Rhythm: Normal rate.  Pulmonary:     Effort: Pulmonary effort is normal.  Musculoskeletal: Normal range of motion.  Skin:    General: Skin is warm and dry.  Neurological:     Mental Status: She is alert and oriented to person, place, and time.  Psychiatric:        Mood and Affect: Mood normal.        Behavior: Behavior normal.    RECENT LABS AND TESTS: BMET    Component Value Date/Time   NA 138 12/02/2017 1254   K 3.8 12/02/2017 1254   CL 100 12/02/2017 1254   CO2 20 12/02/2017 1254   GLUCOSE 75 12/02/2017 1254   GLUCOSE 91 02/19/2017 0935   GLUCOSE 72 08/30/2013 1342   BUN 12 12/02/2017 1254   CREATININE 0.94 12/02/2017 1254   CREATININE 0.73 11/09/2013 1509   CALCIUM 9.5 12/02/2017 1254   GFRNONAA 82 12/02/2017 1254   GFRAA 94 12/02/2017 1254   Lab Results  Component Value Date   HGBA1C 5.0 12/02/2017   HGBA1C 5.2 09/02/2017   HGBA1C 5.0 04/18/2016   Lab Results  Component Value Date   INSULIN 20.3 12/02/2017   INSULIN 42.7 (H) 09/02/2017   CBC    Component Value Date/Time   WBC 7.3 09/02/2017 0942   WBC 7.6 02/19/2017 0935   RBC 5.18 09/02/2017 0942   RBC 4.95 02/19/2017 0935   HGB 15.3 09/02/2017 0942   HCT 45.5 09/02/2017 0942   PLT 247 02/19/2017 0935   PLT 240 04/18/2016 0832   MCV 88 09/02/2017 0942   MCH 29.5 09/02/2017 0942   MCH 28.5 02/19/2017 0935   MCHC 33.6 09/02/2017 0942   MCHC 32.6 02/19/2017 0935   RDW 13.3 09/02/2017 0942   LYMPHSABS 1.5 09/02/2017 0942   MONOABS 0.4 06/01/2013 0956   EOSABS 0.1 09/02/2017 0942   BASOSABS 0.0 09/02/2017 0942   Iron/TIBC/Ferritin/ %Sat    Component Value Date/Time   IRON 101 06/30/2015 1007   TIBC 360 06/30/2015 1007   FERRITIN 56 06/30/2015 1007   IRONPCTSAT 28 06/30/2015 1007   Lipid Panel     Component Value Date/Time   CHOL 168  12/02/2017 1254   TRIG 108 12/02/2017 1254   HDL 39 (L) 12/02/2017 1254   CHOLHDL 3.5 06/30/2015 1007   LDLCALC 107 (H) 12/02/2017 1254   LDLDIRECT 118 (H) 06/24/2017 1633   Hepatic  Function Panel     Component Value Date/Time   PROT 7.5 12/02/2017 1254   ALBUMIN 4.8 12/02/2017 1254   AST 18 12/02/2017 1254   ALT 21 12/02/2017 1254   ALKPHOS 95 12/02/2017 1254   BILITOT 0.4 12/02/2017 1254      Component Value Date/Time   TSH 2.760 09/02/2017 0942   TSH 2.880 06/24/2017 1633   TSH 2.740 06/30/2015 1007   Results for ESTEFANIE, CORNFORTH (MRN [id number]) as of 03/05/2018 12:06  Ref. Range 12/02/2017 12:54  Vitamin D, 25-Hydroxy Latest Ref Range: 30.0 - 100.0 ng/mL 35.4    OBESITY BEHAVIORAL INTERVENTION VISIT  Today's visit was # 10   Starting weight: 293 lbs Starting date: 09/02/17 Today's weight : Weight: 270 lb (122.5 kg)  Today's date: 03/05/2018 Total lbs lost to date: 23 A ASK: We discussed the diagnosis of obesity with [name] today and Aldona Lento agreed to give [place] permission to discuss obesity behavioral modification therapy today.  ASSESS: Domanique has the diagnosis of obesity and her BMI today is 46.3. Shuntell is in the action stage of change.   ADVISE: Navpreet was educated on the multiple health risks of obesity as well as the benefit of weight loss to improve her health. She was advised of the need for long term treatment and the importance of lifestyle modifications to improve her current health and to decrease her risk of future health problems.  AGREE: Multiple dietary modification options and treatment options were discussed and Dodie agreed to follow the recommendations documented in the above note.  ARRANGE: Rosalita was educated on the importance of frequent visits to treat obesity as outlined per CMS and USPSTF guidelines and agreed to schedule her next follow up appointment today.  I, Marcille Blanco, am acting as transcriptionist for Starlyn Skeans,  MD               I have reviewed the above documentation for accuracy and completeness, and I agree with the above. -Dennard Nip, MD

## 2018-03-06 LAB — COMPREHENSIVE METABOLIC PANEL
ALT: 17 IU/L (ref 0–32)
AST: 16 IU/L (ref 0–40)
Albumin/Globulin Ratio: 1.6 (ref 1.2–2.2)
Albumin: 4.4 g/dL (ref 3.9–5.0)
Alkaline Phosphatase: 95 IU/L (ref 39–117)
BUN/Creatinine Ratio: 12 (ref 9–23)
BUN: 11 mg/dL (ref 6–20)
Bilirubin Total: 0.3 mg/dL (ref 0.0–1.2)
CO2: 19 mmol/L — ABNORMAL LOW (ref 20–29)
Calcium: 9.3 mg/dL (ref 8.7–10.2)
Chloride: 102 mmol/L (ref 96–106)
Creatinine, Ser: 0.94 mg/dL (ref 0.57–1.00)
GFR calc Af Amer: 94 mL/min/{1.73_m2} (ref >59)
GFR, EST NON AFRICAN AMERICAN: 82 mL/min/{1.73_m2} (ref >59)
GLUCOSE: 80 mg/dL (ref 65–99)
Globulin, Total: 2.8 g/dL (ref 1.5–4.5)
Potassium: 4.1 mmol/L (ref 3.5–5.2)
Sodium: 142 mmol/L (ref 134–144)
Total Protein: 7.2 g/dL (ref 6.0–8.5)

## 2018-03-06 LAB — LIPID PANEL WITH LDL/HDL RATIO
Cholesterol, Total: 165 mg/dL (ref 100–199)
HDL: 43 mg/dL (ref >39)
LDL Calculated: 107 mg/dL — ABNORMAL HIGH (ref 0–99)
LDl/HDL Ratio: 2.5 ratio (ref 0.0–3.2)
TRIGLYCERIDES: 74 mg/dL (ref 0–149)
VLDL Cholesterol Cal: 15 mg/dL (ref 5–40)

## 2018-03-06 LAB — HEMOGLOBIN A1C
Est. average glucose Bld gHb Est-mCnc: 100 mg/dL
Hgb A1c MFr Bld: 5.1 % (ref 4.8–5.6)

## 2018-03-06 LAB — VITAMIN D 25 HYDROXY (VIT D DEFICIENCY, FRACTURES): VIT D 25 HYDROXY: 48.4 ng/mL (ref 30.0–100.0)

## 2018-03-06 LAB — INSULIN, RANDOM: INSULIN: 19.7 u[IU]/mL (ref 2.6–24.9)

## 2018-03-24 ENCOUNTER — Other Ambulatory Visit: Payer: Self-pay | Admitting: *Deleted

## 2018-03-26 ENCOUNTER — Encounter (INDEPENDENT_AMBULATORY_CARE_PROVIDER_SITE_OTHER): Payer: Self-pay | Admitting: Physician Assistant

## 2018-03-26 ENCOUNTER — Ambulatory Visit (INDEPENDENT_AMBULATORY_CARE_PROVIDER_SITE_OTHER): Payer: Medicaid Other | Admitting: Physician Assistant

## 2018-03-26 ENCOUNTER — Other Ambulatory Visit (INDEPENDENT_AMBULATORY_CARE_PROVIDER_SITE_OTHER): Payer: Self-pay

## 2018-03-26 VITALS — BP 130/83 | HR 71 | Temp 98.1°F | Ht 64.0 in | Wt 270.0 lb

## 2018-03-26 DIAGNOSIS — E559 Vitamin D deficiency, unspecified: Secondary | ICD-10-CM | POA: Diagnosis not present

## 2018-03-26 DIAGNOSIS — Z6841 Body Mass Index (BMI) 40.0 and over, adult: Secondary | ICD-10-CM | POA: Diagnosis not present

## 2018-03-26 DIAGNOSIS — Z9189 Other specified personal risk factors, not elsewhere classified: Secondary | ICD-10-CM | POA: Diagnosis not present

## 2018-03-26 DIAGNOSIS — F3289 Other specified depressive episodes: Secondary | ICD-10-CM

## 2018-03-26 DIAGNOSIS — E7849 Other hyperlipidemia: Secondary | ICD-10-CM

## 2018-03-26 MED ORDER — VITAMIN D-3 125 MCG (5000 UT) PO TABS: 5000.0000 [IU] | ORAL_TABLET | Freq: Every day | ORAL | 0 refills | Status: DC

## 2018-03-26 MED ORDER — BUPROPION HCL ER (SR) 150 MG PO TB12: 150.0000 mg | ORAL_TABLET | Freq: Every day | ORAL | 0 refills | Status: DC

## 2018-03-26 NOTE — Progress Notes (Signed)
Office: [phone]  /  Fax: [phone]   HPI:   Chief Complaint: OBESITY Kelly Fry is here to discuss her progress with her obesity treatment plan. She is on the Category 2 plan and is following her eating plan approximately 0% of the time. She states she is exercising 0 minutes 0 times per week. Kelly Fry reports that she has had two deaths in her family recently and has had a decreased appetite. 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 Her weight is 270 lb (122.5 kg) today and has not lost weight since her last visit. She has lost 23 lbs since starting treatment with [place].  Vitamin D deficiency Kelly Fry has a diagnosis of Vitamin D deficiency. She is currently taking prescription Vit D and denies nausea, vomiting or muscle weakness.  Hyperlipidemia Kelly Fry has hyperlipidemia and has been trying to improve her cholesterol levels with intensive lifestyle modification including a low saturated fat diet, exercise and weight loss. Her last level was not at goal. Kelly Fry is not on medications. She denies any chest pain.  At risk for cardiovascular disease Kelly Fry is at a higher than average risk for cardiovascular disease due to obesity. She currently denies any chest pain.  ASSESSMENT AND PLAN:  Vitamin D deficiency - Plan: Cholecalciferol (VITAMIN D-3) 125 MCG (5000 UT) TABS  Other hyperlipidemia  At risk for heart disease  Class 3 severe obesity with serious comorbidity and body mass index (BMI) of 45.0 to 49.9 in adult, unspecified obesity type ([place])  PLAN:  Vitamin D Deficiency Kelly Fry was informed that low Vitamin D levels contributes to fatigue and are associated with obesity, breast, and colon cancer. She agrees to change to Vitamin D 5000 units daily #30 with no refills and will follow-up for routine testing of Vitamin D, at least 2-3 times per year. She was informed of the risk of over-replacement of Vitamin D and  agrees to not increase her dose unless she discusses this with [place] first. Kelly Fry agrees to follow-up with our clinic in 3 weeks.  Hyperlipidemia Kelly Fry was informed of the American Heart Association Guidelines emphasizing intensive lifestyle modifications as the first line treatment for hyperlipidemia. We discussed many lifestyle modifications today in depth, and Hassie will continue to work on decreasing saturated fats such as fatty red meat, butter and many fried foods. She will also increase vegetables and lean protein in her diet and continue to work on exercise and weight loss efforts.  Cardiovascular risk counseling Kelly Fry was given extended (15 minutes) coronary artery disease prevention counseling today. She is 33 y.o. female and has risk factors for heart disease including obesity. We discussed intensive lifestyle modifications today with an emphasis on specific weight loss instructions and strategies. Pt was also informed of the importance of increasing exercise and decreasing saturated fats to help prevent heart disease.  Obesity Kelly Fry is currently in the action stage of change. As such, her goal is to continue with weight loss efforts. She has agreed to follow the Category 2 plan. Kelly Fry has been instructed to work up to a goal of 150 minutes of combined cardio and strengthening exercise per week for weight loss and overall health benefits. We discussed the following Behavioral Modification Strategies today: no skipping meals and work on meal planning and easy cooking plans  Kelly Fry has agreed to follow up with our clinic in 3 weeks. She was informed of the importance of frequent follow up visits to  maximize her success with intensive lifestyle modifications for her multiple health conditions.  ALLERGIES: Allergies  Allergen Reactions  . Latuda [Lurasidone] Other (See Comments)    Shaky; worsened mood    MEDICATIONS: Current Outpatient Medications on File Prior to Visit    Medication Sig Dispense Refill  . albuterol (PROVENTIL HFA;VENTOLIN HFA) 108 (90 Base) MCG/ACT inhaler Inhale 2 puffs into the lungs every 6 (six) hours as needed for wheezing or shortness of breath. 1 Inhaler 2  . buPROPion (WELLBUTRIN SR) 150 MG 12 hr tablet Take 1 tablet (150 mg total) by mouth daily. 30 tablet 0  . hydrochlorothiazide (MICROZIDE) 12.5 MG capsule Take 1 capsule (12.5 mg total) by mouth daily. 90 capsule 1  . metFORMIN (GLUCOPHAGE) 500 MG tablet Take 1 tablet (500 mg total) by mouth daily with breakfast. 30 tablet 0   No current facility-administered medications on file prior to visit.     PAST MEDICAL HISTORY: Past Medical History:  Diagnosis Date  . Ankle instability, right   . Anxiety   . Back pain   . Bipolar disorder ([place])   . Complication of anesthesia   . Depression   . Dyspnea   . Family history of adverse reaction to anesthesia    Dad had n/v post anesthesia  . GERD (gastroesophageal reflux disease)   . Leg edema   . Medical history non-contributory   . Miscarriage   . PONV (postoperative nausea and vomiting)   . Vitamin D deficiency     PAST SURGICAL HISTORY: Past Surgical History:  Procedure Laterality Date  . ANKLE RECONSTRUCTION Right 02/21/2017   Procedure: RIGHT LATERAL ANKLE RECONSTRUCTION WITH SPLIT PERONEAL LONGUS TENDON;  Surgeon: Marybelle Killings, MD;  Location: [facility];  Service: Orthopedics;  Laterality: Right;  . NO PAST SURGERIES    . WISDOM TOOTH EXTRACTION  04/12/2015    SOCIAL HISTORY: Social History   Tobacco Use  . Smoking status: Never Smoker  . Smokeless tobacco: Never Used  Substance Use Topics  . Alcohol use: No  . Drug use: No    FAMILY HISTORY: Family History  Problem Relation Age of Onset  . Kidney disease Father   . COPD Father   . Hypertension Father   . Depression Father   . Sleep apnea Father   . Obesity Father   . Kidney disease Sister   . Asthma Sister   . Early death Mother   . Stroke Mother   .  Heart disease Mother   . Hypertension Mother   . Alcoholism Mother   . Asthma Brother     ROS: Review of Systems  Constitutional: Negative for weight loss.  Cardiovascular: Negative for chest pain.  Gastrointestinal: Negative for nausea and vomiting.  Musculoskeletal:       Negative for muscle weakness.  Endo/Heme/Allergies:       Negative for hypoglycemia.   PHYSICAL EXAM: Blood pressure 130/83, pulse 71, temperature 98.1 F (36.7 C), temperature source Oral, height 5\' 4"  (1.626 m), weight 270 lb (122.5 kg), SpO2 100 %. Body mass index is 46.35 kg/m. Physical Exam Vitals signs reviewed.  Constitutional:      Appearance: Normal appearance. She is obese.  Cardiovascular:     Rate and Rhythm: Normal rate.     Pulses: Normal pulses.  Pulmonary:     Effort: Pulmonary effort is normal.     Breath sounds: Normal breath sounds.  Musculoskeletal: Normal range of motion.  Skin:    General: Skin is warm and  dry.  Neurological:     Mental Status: She is alert and oriented to person, place, and time.  Psychiatric:        Behavior: Behavior normal.   RECENT LABS AND TESTS: BMET    Component Value Date/Time   NA 142 03/05/2018 1127   K 4.1 03/05/2018 1127   CL 102 03/05/2018 1127   CO2 19 (L) 03/05/2018 1127   GLUCOSE 80 03/05/2018 1127   GLUCOSE 91 02/19/2017 0935   GLUCOSE 72 08/30/2013 1342   BUN 11 03/05/2018 1127   CREATININE 0.94 03/05/2018 1127   CREATININE 0.73 11/09/2013 1509   CALCIUM 9.3 03/05/2018 1127   GFRNONAA 82 03/05/2018 1127   GFRAA 94 03/05/2018 1127   Lab Results  Component Value Date   HGBA1C 5.1 03/05/2018   HGBA1C 5.0 12/02/2017   HGBA1C 5.2 09/02/2017   HGBA1C 5.0 04/18/2016   Lab Results  Component Value Date   INSULIN 19.7 03/05/2018   INSULIN 20.3 12/02/2017   INSULIN 42.7 (H) 09/02/2017   CBC    Component Value Date/Time   WBC 7.3 09/02/2017 0942   WBC 7.6 02/19/2017 0935   RBC 5.18 09/02/2017 0942   RBC 4.95 02/19/2017 0935     HGB 15.3 09/02/2017 0942   HCT 45.5 09/02/2017 0942   PLT 247 02/19/2017 0935   PLT 240 04/18/2016 0832   MCV 88 09/02/2017 0942   MCH 29.5 09/02/2017 0942   MCH 28.5 02/19/2017 0935   MCHC 33.6 09/02/2017 0942   MCHC 32.6 02/19/2017 0935   RDW 13.3 09/02/2017 0942   LYMPHSABS 1.5 09/02/2017 0942   MONOABS 0.4 06/01/2013 0956   EOSABS 0.1 09/02/2017 0942   BASOSABS 0.0 09/02/2017 0942   Iron/TIBC/Ferritin/ %Sat    Component Value Date/Time   IRON 101 06/30/2015 1007   TIBC 360 06/30/2015 1007   FERRITIN 56 06/30/2015 1007   IRONPCTSAT 28 06/30/2015 1007   Lipid Panel     Component Value Date/Time   CHOL 165 03/05/2018 1127   TRIG 74 03/05/2018 1127   HDL 43 03/05/2018 1127   CHOLHDL 3.5 06/30/2015 1007   LDLCALC 107 (H) 03/05/2018 1127   LDLDIRECT 118 (H) 06/24/2017 1633   Hepatic Function Panel     Component Value Date/Time   PROT 7.2 03/05/2018 1127   ALBUMIN 4.4 03/05/2018 1127   AST 16 03/05/2018 1127   ALT 17 03/05/2018 1127   ALKPHOS 95 03/05/2018 1127   BILITOT 0.3 03/05/2018 1127      Component Value Date/Time   TSH 2.760 09/02/2017 0942   TSH 2.880 06/24/2017 1633   TSH 2.740 06/30/2015 1007   Results for TZIPPORAH, NAGORSKI (MRN [id number]) as of 03/26/2018 12:51  Ref. Range 03/05/2018 11:27  Vitamin D, 25-Hydroxy Latest Ref Range: 30.0 - 100.0 ng/mL 48.4   OBESITY BEHAVIORAL INTERVENTION VISIT  Today's visit was #11  Starting weight: 293 lbs Starting date: 09/02/2017 Today's weight: 270 lbs Today's date: 03/26/2018 Total lbs lost to date: 94  ASK: We discussed the diagnosis of obesity with [name] today and Aldona Lento agreed to give [place] permission to discuss obesity behavioral modification therapy today.  ASSESS: Mickie has the diagnosis of obesity and her BMI today is 46.35. Deangela is in the action stage of change.   ADVISE: Briante was educated on the multiple health risks of obesity as well as the benefit of weight loss to improve  her health. She was advised of the need for long term treatment and the  importance of lifestyle modifications to improve her current health and to decrease her risk of future health problems.  AGREE: Multiple dietary modification options and treatment options were discussed and  [place] agreed to follow the recommendations documented in the above note.  ARRANGE: Tonjua was educated on the importance of frequent visits to treat obesity as outlined per CMS and USPSTF guidelines and agreed to schedule her next follow up appointment today.  Migdalia Dk, am acting as transcriptionist for Abby Potash, PA-C I, Abby Potash, PA-C have reviewed above note and agree with its content

## 2018-04-16 ENCOUNTER — Ambulatory Visit (INDEPENDENT_AMBULATORY_CARE_PROVIDER_SITE_OTHER): Payer: Medicaid Other | Admitting: Family Medicine

## 2018-04-16 ENCOUNTER — Encounter (INDEPENDENT_AMBULATORY_CARE_PROVIDER_SITE_OTHER): Payer: Self-pay | Admitting: Family Medicine

## 2018-04-16 VITALS — BP 133/88 | HR 80 | Ht 64.0 in | Wt 270.0 lb

## 2018-04-16 DIAGNOSIS — Z6841 Body Mass Index (BMI) 40.0 and over, adult: Secondary | ICD-10-CM

## 2018-04-16 DIAGNOSIS — R7303 Prediabetes: Secondary | ICD-10-CM

## 2018-04-16 MED ORDER — METFORMIN HCL 500 MG PO TABS: 500.0000 mg | ORAL_TABLET | Freq: Every day | ORAL | 0 refills | Status: DC

## 2018-04-20 NOTE — Progress Notes (Signed)
Office: [phone]  /  Fax: [phone]   HPI:   Chief Complaint: OBESITY Kelly Fry is here to discuss her progress with her obesity treatment plan. She is on the Category 2 plan and is following her eating plan approximately 0 % of the time. She states she is moving more. Kelly Fry has done well maintaining her weight while moving into a new home. She is very active with unpacking and cleaning, but feels she will be able to get back on track with her eating plan soon.  Her weight is 270 lb (122.5 kg) today and has not lost weight since her last visit. She has lost 23 lbs since starting treatment with [place].  Pre-Diabetes Kelly Fry has a diagnosis of pre-diabetes based on her elevated Hgb A1c and was informed this puts her at greater risk of developing diabetes. She is stable on metformin currently and she is having reduced polyphagia. She continues to work on diet and exercise to decrease risk of diabetes. She denies nausea, vomiting, or hypoglycemia.  ASSESSMENT AND PLAN:  Prediabetes - Plan: metFORMIN (GLUCOPHAGE) 500 MG tablet  Class 3 severe obesity with serious comorbidity and body mass index (BMI) of 45.0 to 49.9 in adult, unspecified obesity type [facility])  PLAN:  Pre-Diabetes Kelly Fry will continue to work on weight loss, exercise, and decreasing simple carbohydrates in her diet to help decrease the risk of diabetes. We discussed metformin including benefits and risks. She was informed that eating too many simple carbohydrates or too many calories at one sitting increases the likelihood of GI side effects. Kelly Fry agreed to continue metformin 500mg  qAM #30 with no refills and a prescription was written today. Kelly Fry agreed to get back to her diet prescription and she follow up with [place] as directed to monitor her progress in 3 weeks.  Obesity Kelly Fry is currently in the action stage of change. As such, her goal is to continue with weight loss efforts. She has agreed to follow the Category 2  plan. Kelly Fry has been instructed to work up to a goal of 150 minutes of combined cardio and strengthening exercise per week for weight loss and overall health benefits. We discussed the following Behavioral Modification Strategies today: increasing lean protein intake, decreasing simple carbohydrates, and work on meal planning and easy cooking plans.  Kelly Fry has agreed to follow up with our clinic in 3 weeks. She was informed of the importance of frequent follow up visits to maximize her success with intensive lifestyle modifications for her multiple health conditions.  ALLERGIES: Allergies  Allergen Reactions  . Latuda [Lurasidone] Other (See Comments)    Shaky; worsened mood    MEDICATIONS: Current Outpatient Medications on File Prior to Visit  Medication Sig Dispense Refill  . albuterol (PROVENTIL HFA;VENTOLIN HFA) 108 (90 Base) MCG/ACT inhaler Inhale 2 puffs into the lungs every 6 (six) hours as needed for wheezing or shortness of breath. 1 Inhaler 2  . buPROPion (WELLBUTRIN SR) 150 MG 12 hr tablet Take 1 tablet (150 mg total) by mouth daily. 30 tablet 0  . Cholecalciferol (VITAMIN D-3) 125 MCG (5000 UT) TABS Take 5,000 Units by mouth daily. 30 tablet 0  . hydrochlorothiazide (MICROZIDE) 12.5 MG capsule Take 1 capsule (12.5 mg total) by mouth daily. 90 capsule 1   No current facility-administered medications on file prior to visit.     PAST MEDICAL HISTORY: Past Medical History:  Diagnosis Date  . Ankle instability, right   . Anxiety   . Back pain   . Bipolar  disorder ([place])   . Complication of anesthesia   . Depression   . Dyspnea   . Family history of adverse reaction to anesthesia    Dad had n/v post anesthesia  . GERD (gastroesophageal reflux disease)   . Leg edema   . Medical history non-contributory   . Miscarriage   . PONV (postoperative nausea and vomiting)   . Vitamin D deficiency     PAST SURGICAL HISTORY: Past Surgical History:  Procedure Laterality Date    . ANKLE RECONSTRUCTION Right 02/21/2017   Procedure: RIGHT LATERAL ANKLE RECONSTRUCTION WITH SPLIT PERONEAL LONGUS TENDON;  Surgeon: Marybelle Killings, MD;  Location: [place];  Service: Orthopedics;  Laterality: Right;  . NO PAST SURGERIES    . WISDOM TOOTH EXTRACTION  04/12/2015    SOCIAL HISTORY: Social History   Tobacco Use  . Smoking status: Never Smoker  . Smokeless tobacco: Never Used  Substance Use Topics  . Alcohol use: No  . Drug use: No    FAMILY HISTORY: Family History  Problem Relation Age of Onset  . Kidney disease Father   . COPD Father   . Hypertension Father   . Depression Father   . Sleep apnea Father   . Obesity Father   . Kidney disease Sister   . Asthma Sister   . Early death Mother   . Stroke Mother   . Heart disease Mother   . Hypertension Mother   . Alcoholism Mother   . Asthma Brother     ROS: Review of Systems  Constitutional: Negative for weight loss.  Gastrointestinal: Negative for nausea and vomiting.  Endo/Heme/Allergies:       Negative for hypoglycemia.    PHYSICAL EXAM: Blood pressure 133/88, pulse 80, height 5\' 4"  (1.626 m), weight 270 lb (122.5 kg), SpO2 97 %. Body mass index is 46.35 kg/m. Physical Exam Vitals signs reviewed.  Constitutional:      Appearance: Normal appearance. She is obese.  Cardiovascular:     Rate and Rhythm: Normal rate.  Pulmonary:     Effort: Pulmonary effort is normal.  Musculoskeletal: Normal range of motion.  Skin:    General: Skin is warm and dry.  Neurological:     Mental Status: She is alert and oriented to person, place, and time.  Psychiatric:        Mood and Affect: Mood normal.        Behavior: Behavior normal.    RECENT LABS AND TESTS: BMET    Component Value Date/Time   NA 142 03/05/2018 1127   K 4.1 03/05/2018 1127   CL 102 03/05/2018 1127   CO2 19 (L) 03/05/2018 1127   GLUCOSE 80 03/05/2018 1127   GLUCOSE 91 02/19/2017 0935   GLUCOSE 72 08/30/2013 1342   BUN 11 03/05/2018  1127   CREATININE 0.94 03/05/2018 1127   CREATININE 0.73 11/09/2013 1509   CALCIUM 9.3 03/05/2018 1127   GFRNONAA 82 03/05/2018 1127   GFRAA 94 03/05/2018 1127   Lab Results  Component Value Date   HGBA1C 5.1 03/05/2018   HGBA1C 5.0 12/02/2017   HGBA1C 5.2 09/02/2017   HGBA1C 5.0 04/18/2016   Lab Results  Component Value Date   INSULIN 19.7 03/05/2018   INSULIN 20.3 12/02/2017   INSULIN 42.7 (H) 09/02/2017   CBC    Component Value Date/Time   WBC 7.3 09/02/2017 0942   WBC 7.6 02/19/2017 0935   RBC 5.18 09/02/2017 0942   RBC 4.95 02/19/2017 0935   HGB 15.3  09/02/2017 0942   HCT 45.5 09/02/2017 0942   PLT 247 02/19/2017 0935   PLT 240 04/18/2016 0832   MCV 88 09/02/2017 0942   MCH 29.5 09/02/2017 0942   MCH 28.5 02/19/2017 0935   MCHC 33.6 09/02/2017 0942   MCHC 32.6 02/19/2017 0935   RDW 13.3 09/02/2017 0942   LYMPHSABS 1.5 09/02/2017 0942   MONOABS 0.4 06/01/2013 0956   EOSABS 0.1 09/02/2017 0942   BASOSABS 0.0 09/02/2017 0942   Iron/TIBC/Ferritin/ %Sat    Component Value Date/Time   IRON 101 06/30/2015 1007   TIBC 360 06/30/2015 1007   FERRITIN 56 06/30/2015 1007   IRONPCTSAT 28 06/30/2015 1007   Lipid Panel     Component Value Date/Time   CHOL 165 03/05/2018 1127   TRIG 74 03/05/2018 1127   HDL 43 03/05/2018 1127   CHOLHDL 3.5 06/30/2015 1007   LDLCALC 107 (H) 03/05/2018 1127   LDLDIRECT 118 (H) 06/24/2017 1633   Hepatic Function Panel     Component Value Date/Time   PROT 7.2 03/05/2018 1127   ALBUMIN 4.4 03/05/2018 1127   AST 16 03/05/2018 1127   ALT 17 03/05/2018 1127   ALKPHOS 95 03/05/2018 1127   BILITOT 0.3 03/05/2018 1127      Component Value Date/Time   TSH 2.760 09/02/2017 0942   TSH 2.880 06/24/2017 1633   TSH 2.740 06/30/2015 1007   Results for Kelly Fry, Kelly Fry (MRN [id number]) as of 04/20/2018 08:28  Ref. Range 03/05/2018 11:27  Vitamin D, 25-Hydroxy Latest Ref Range: 30.0 - 100.0 ng/mL 48.4   OBESITY BEHAVIORAL INTERVENTION  VISIT  Today's visit was # 12  Starting weight: 293 lbs Starting date: 09/02/17 Today's weight : Weight: 270 lb (122.5 kg)  Today's date: 04/16/2018 Total lbs lost to date: 23   04/16/2018  Height 5\' 4"  (1.626 m)  Weight 270 lb (122.5 kg)  BMI (Calculated) 46.32  BLOOD PRESSURE - SYSTOLIC 854  BLOOD PRESSURE - DIASTOLIC 88   Body Fat % 62.7 %  Total Body Water (lbs) 94.4 lbs    ASK: We discussed the diagnosis of obesity with Kelly Fry today and Kelly Fry agreed to give [place] permission to discuss obesity behavioral modification therapy today.  ASSESS: Kelly Fry has the diagnosis of obesity and her BMI today is 46.32. Kelly Fry is in the action stage of change.   ADVISE: Kelly Fry was educated on the multiple health risks of obesity as well as the benefit of weight loss to improve her health. She was advised of the need for long term treatment and the importance of lifestyle modifications to improve her current health and to decrease her risk of future health problems.  AGREE: Multiple dietary modification options and treatment options were discussed and Jnai agreed to follow the recommendations documented in the above note.  ARRANGE: Terri was educated on the importance of frequent visits to treat obesity as outlined per CMS and USPSTF guidelines and agreed to schedule her next follow up appointment today.  IMarcille Blanco, CMA, am acting as transcriptionist for Starlyn Skeans, MD  I have reviewed the above documentation for accuracy and completeness, and I agree with the above. -Dennard Nip, MD

## 2018-04-24 ENCOUNTER — Ambulatory Visit (INDEPENDENT_AMBULATORY_CARE_PROVIDER_SITE_OTHER): Payer: Medicaid Other | Admitting: Family Medicine

## 2018-04-24 ENCOUNTER — Encounter: Payer: Self-pay | Admitting: Family Medicine

## 2018-04-24 ENCOUNTER — Ambulatory Visit (INDEPENDENT_AMBULATORY_CARE_PROVIDER_SITE_OTHER): Payer: Medicaid Other

## 2018-04-24 ENCOUNTER — Other Ambulatory Visit: Payer: Self-pay

## 2018-04-24 VITALS — BP 135/85 | HR 93 | Temp 99.3°F | Ht 64.0 in | Wt 277.0 lb

## 2018-04-24 DIAGNOSIS — R3129 Other microscopic hematuria: Secondary | ICD-10-CM | POA: Diagnosis not present

## 2018-04-24 DIAGNOSIS — K5909 Other constipation: Secondary | ICD-10-CM | POA: Diagnosis not present

## 2018-04-24 DIAGNOSIS — R109 Unspecified abdominal pain: Secondary | ICD-10-CM

## 2018-04-24 LAB — URINALYSIS, COMPLETE
Bilirubin, UA: NEGATIVE
Glucose, UA: NEGATIVE
Ketones, UA: NEGATIVE
Nitrite, UA: NEGATIVE
Protein, UA: NEGATIVE
Specific Gravity, UA: 1.025 (ref 1.005–1.030)
Urobilinogen, Ur: 0.2 mg/dL (ref 0.2–1.0)
pH, UA: 7 (ref 5.0–7.5)

## 2018-04-24 LAB — MICROSCOPIC EXAMINATION
Bacteria, UA: NONE SEEN
Renal Epithel, UA: NONE SEEN /hpf

## 2018-04-24 LAB — PREGNANCY, URINE: Preg Test, Ur: NEGATIVE

## 2018-04-24 NOTE — Patient Instructions (Signed)
Thank you for coming in to clinic today.  1. Your symptoms are consistent with Constipation, likely cause of your General Abdominal Pain / Cramping. 2. Start with Miralax, prescription was sent to pharmacy. First dose 68g (4 capfuls) in 32oz water over 1 to 2 hours for clean out. Next day start 17g or 1 capful daily, may adjust dose up or down by half a capful every few days. Recommend to take this medicine daily for next 1-2 weeks, you may need to use it longer if needed. - Goal is to have soft regular bowel movement 1-3x daily, if too runny or diarrhea, then reduce dose of the medicine to every other day.  Improve water intake, hydration will help Also recommend increased vegetables, fruits, fiber intake Can try daily Metamucil or Fiber supplement at pharmacy over the counter  Follow-up if symptoms are not improving with bowel movements, or if pain worsens, develop fevers, nausea, vomiting.  Please schedule a follow-up appointment with Michelle Rakes, FNP, in 1 month to follow-up Constipation  If you have any other questions or concerns, please feel free to call the clinic to contact me. You may also schedule an earlier appointment if necessary.  However, if your symptoms get significantly worse, please go to the Emergency Department to seek immediate medical attention.  

## 2018-04-24 NOTE — Progress Notes (Signed)
Subjective:  Patient ID: Kelly Fry, female    DOB: August 07, 1985, 33 y.o.   MRN: [id number]  Chief Complaint:  LUQ pain radiating into side   HPI: Kelly Fry is a 33 y.o. female presenting on 04/24/2018 for LUQ pain radiating into side   Pt presents today with left flank pain that radiates to her left lower abdomen. Pt states this pain started 3 days ago. States it initially started in the flank area and then radiated into her left lower abdomen yesterday. She states the pain is aching and sharp at times, 4/10 at worst. She denies fever, chills, n/v/d, dyspareunia, vaginal symptoms, urinary symptoms, or chest pain. Pts LMP was over a year ago, she has a Nexplanon.   Relevant past medical, surgical, family, and social history reviewed and updated as indicated.  Allergies and medications reviewed and updated.   Past Medical History:  Diagnosis Date  . Ankle instability, right   . Anxiety   . Back pain   . Bipolar disorder ([place])   . Complication of anesthesia   . Depression   . Dyspnea   . Family history of adverse reaction to anesthesia    Dad had n/v post anesthesia  . GERD (gastroesophageal reflux disease)   . Leg edema   . Medical history non-contributory   . Miscarriage   . PONV (postoperative nausea and vomiting)   . Vitamin D deficiency     Past Surgical History:  Procedure Laterality Date  . ANKLE RECONSTRUCTION Right 02/21/2017   Procedure: RIGHT LATERAL ANKLE RECONSTRUCTION WITH SPLIT PERONEAL LONGUS TENDON;  Surgeon: Marybelle Killings, MD;  Location: [facility];  Service: Orthopedics;  Laterality: Right;  . NO PAST SURGERIES    . WISDOM TOOTH EXTRACTION  04/12/2015    Social History   Socioeconomic History  . Marital status: Married    Spouse name: Kimber Relic  . Number of children: 2  . Years of education: Not on file  . Highest education level: Not on file  Occupational History  . Occupation: Stay at [employer]  . Financial resource  strain: Not on file  . Food insecurity:    Worry: Not on file    Inability: Not on file  . Transportation needs:    Medical: Not on file    Non-medical: Not on file  Tobacco Use  . Smoking status: Never Smoker  . Smokeless tobacco: Never Used  Substance and Sexual Activity  . Alcohol use: No  . Drug use: No  . Sexual activity: Yes    Birth control/protection: I.U.D.  Lifestyle  . Physical activity:    Days per week: Not on file    Minutes per session: Not on file  . Stress: Not on file  Relationships  . Social connections:    Talks on phone: Not on file    Gets together: Not on file    Attends religious service: Not on file    Active member of club or organization: Not on file    Attends meetings of clubs or organizations: Not on file    Relationship status: Not on file  . Intimate partner violence:    Fear of current or ex partner: Not on file    Emotionally abused: Not on file    Physically abused: Not on file    Forced sexual activity: Not on file  Other Topics Concern  . Not on file  Social History Narrative  . Not on file  Outpatient Encounter Medications as of 04/24/2018  Medication Sig  . albuterol (PROVENTIL HFA;VENTOLIN HFA) 108 (90 Base) MCG/ACT inhaler Inhale 2 puffs into the lungs every 6 (six) hours as needed for wheezing or shortness of breath.  [name] buPROPion (WELLBUTRIN SR) 150 MG 12 hr tablet Take 1 tablet (150 mg total) by mouth daily.  . Cholecalciferol (VITAMIN D-3) 125 MCG (5000 UT) TABS Take 5,000 Units by mouth daily.  . hydrochlorothiazide (MICROZIDE) 12.5 MG capsule Take 1 capsule (12.5 mg total) by mouth daily.  . metFORMIN (GLUCOPHAGE) 500 MG tablet Take 1 tablet (500 mg total) by mouth daily with breakfast.   No facility-administered encounter medications on file as of 04/24/2018.     Allergies  Allergen Reactions  . Latuda [Lurasidone] Other (See Comments)    Shaky; worsened mood    Review of Systems  Constitutional: Negative for  activity change, chills, fatigue and fever.  Respiratory: Negative for cough, chest tightness and shortness of breath.   Cardiovascular: Negative for chest pain, palpitations and leg swelling.  Gastrointestinal: Positive for abdominal pain (LLQ). Negative for abdominal distention, anal bleeding, blood in stool, constipation, diarrhea, nausea, rectal pain and vomiting.  Genitourinary: Positive for flank pain (LLQ). Negative for decreased urine volume, difficulty urinating, dyspareunia, dysuria, enuresis, frequency, genital sores, hematuria, menstrual problem, pelvic pain, urgency, vaginal bleeding, vaginal discharge and vaginal pain.  Skin: Negative for rash.  Neurological: Negative for dizziness, weakness, light-headedness and headaches.  Psychiatric/Behavioral: Negative for confusion.  All other systems reviewed and are negative.       Objective:  BP 135/85   Pulse 93   Temp 99.3 F (37.4 C) (Oral)   Ht 5\' 4"  (1.626 m)   Wt 277 lb (125.6 kg)   BMI 47.55 kg/m    Wt Readings from Last 3 Encounters:  04/24/18 277 lb (125.6 kg)  04/16/18 270 lb (122.5 kg)  03/26/18 270 lb (122.5 kg)    Physical Exam Vitals signs and nursing note reviewed.  Constitutional:      General: She is not in acute distress.    Appearance: She is well-developed and well-groomed. She is obese. She is not ill-appearing or toxic-appearing.  HENT:     Head: Normocephalic and atraumatic.     Right Ear: Tympanic membrane, ear canal and external ear normal.     Left Ear: Tympanic membrane, ear canal and external ear normal.     Nose: Nose normal.     Mouth/Throat:     Mouth: Mucous membranes are moist.     Pharynx: Oropharynx is clear.  Eyes:     Extraocular Movements: Extraocular movements intact.     Conjunctiva/sclera: Conjunctivae normal.     Pupils: Pupils are equal, round, and reactive to light.  Cardiovascular:     Rate and Rhythm: Normal rate and regular rhythm.     Heart sounds: Normal heart  sounds. No murmur. No friction rub. No gallop.   Pulmonary:     Effort: Pulmonary effort is normal.     Breath sounds: Normal breath sounds.  Abdominal:     General: Abdomen is protuberant. Bowel sounds are normal. There is no distension or abdominal bruit. There are no signs of injury.     Palpations: Abdomen is soft. There is no hepatomegaly or splenomegaly.     Tenderness: There is abdominal tenderness (left mid to left lower abdomen). There is no right CVA tenderness, left CVA tenderness, guarding or rebound. Negative signs include Murphy's sign and McBurney's sign.  Hernia: No hernia is present.  Skin:    General: Skin is warm and dry.     Capillary Refill: Capillary refill takes less than 2 seconds.  Neurological:     General: No focal deficit present.     Mental Status: She is alert and oriented to person, place, and time.  Psychiatric:        Mood and Affect: Mood normal.        Behavior: Behavior normal. Behavior is cooperative.        Thought Content: Thought content normal.        Judgment: Judgment normal.     Results for orders placed or performed in visit on 03/05/18  Comprehensive metabolic panel  Result Value Ref Range   Glucose 80 65 - 99 mg/dL   BUN 11 6 - 20 mg/dL   Creatinine, Ser 0.94 0.57 - 1.00 mg/dL   GFR calc non Af Amer 82 >59 mL/min/1.73   GFR calc Af Amer 94 >59 mL/min/1.73   BUN/Creatinine Ratio 12 9 - 23   Sodium 142 134 - 144 mmol/L   Potassium 4.1 3.5 - 5.2 mmol/L   Chloride 102 96 - 106 mmol/L   CO2 19 (L) 20 - 29 mmol/L   Calcium 9.3 8.7 - 10.2 mg/dL   Total Protein 7.2 6.0 - 8.5 g/dL   Albumin 4.4 3.9 - 5.0 g/dL   Globulin, Total 2.8 1.5 - 4.5 g/dL   Albumin/Globulin Ratio 1.6 1.2 - 2.2   Bilirubin Total 0.3 0.0 - 1.2 mg/dL   Alkaline Phosphatase 95 39 - 117 IU/L   AST 16 0 - 40 IU/L   ALT 17 0 - 32 IU/L  Hemoglobin A1c  Result Value Ref Range   Hgb A1c MFr Bld 5.1 4.8 - 5.6 %   Est. average glucose Bld gHb Est-mCnc 100 mg/dL   Insulin, random  Result Value Ref Range   INSULIN 19.7 2.6 - 24.9 uIU/mL  VITAMIN D 25 Hydroxy (Vit-D Deficiency, Fractures)  Result Value Ref Range   Vit D, 25-Hydroxy 48.4 30.0 - 100.0 ng/mL  Lipid Panel With LDL/HDL Ratio  Result Value Ref Range   Cholesterol, Total 165 100 - 199 mg/dL   Triglycerides 74 0 - 149 mg/dL   HDL 43 >39 mg/dL   VLDL Cholesterol Cal 15 5 - 40 mg/dL   LDL Calculated 107 (H) 0 - 99 mg/dL   LDl/HDL Ratio 2.5 0.0 - 3.2 ratio     Urine pregnancy negative.  Urinalysis in office: 1+ leukocytes, trace blood, negative nitirtes X-Ray: KUB: no obvious renal stones or bowel obstruction. Stool burden. Preliminary x-ray reading by Monia Pouch, FNP-C, [facility].   Pertinent labs & imaging results that were available during my care of the patient were reviewed by me and considered in my medical decision making.  Assessment & Plan:  Edward was seen today for luq pain radiating into side.  Diagnoses and all orders for this visit:  Flank pain Trace microscopic blood in urine. No indications of infection. Pt could have passed a small kidney stone. Xray shows stool burden, will notify pt if radiology reading differs. miralax clean out discussed. Report any new or worsening symptoms.  -     Urinalysis, Complete -     Pregnancy, urine -     DG Abd 1 View; Future  Other microscopic hematuria Report any new or worsening symptoms. Will recheck urine in 4 weeks for resolution of hematuria.  -     Urinalysis,  Complete -     Pregnancy, urine -     DG Abd 1 View; Future  Other constipation Increase water and fiber intake. Miralax clean out discussed.  -     DG Abd 1 View; Future     Continue all other maintenance medications.  Follow up plan: Return in about 4 weeks (around 05/22/2018) for urine recheck.  Educational handout given for constipation   The above assessment and management plan was discussed with the patient. The patient verbalized understanding of and  has agreed to the management plan. Patient is aware to call the clinic if symptoms persist or worsen. Patient is aware when to return to the clinic for a follow-up visit. Patient educated on when it is appropriate to go to the emergency department.   Monia Pouch, FNP-C [facility] [phone]

## 2018-05-07 ENCOUNTER — Ambulatory Visit (INDEPENDENT_AMBULATORY_CARE_PROVIDER_SITE_OTHER): Payer: Medicaid Other | Admitting: Family Medicine

## 2018-05-07 ENCOUNTER — Encounter (INDEPENDENT_AMBULATORY_CARE_PROVIDER_SITE_OTHER): Payer: Self-pay | Admitting: Family Medicine

## 2018-05-07 ENCOUNTER — Encounter (INDEPENDENT_AMBULATORY_CARE_PROVIDER_SITE_OTHER): Payer: Self-pay

## 2018-05-07 ENCOUNTER — Other Ambulatory Visit: Payer: Self-pay

## 2018-05-07 DIAGNOSIS — R7303 Prediabetes: Secondary | ICD-10-CM

## 2018-05-07 DIAGNOSIS — F3289 Other specified depressive episodes: Secondary | ICD-10-CM | POA: Diagnosis not present

## 2018-05-07 DIAGNOSIS — E8881 Metabolic syndrome: Secondary | ICD-10-CM

## 2018-05-07 DIAGNOSIS — Z6841 Body Mass Index (BMI) 40.0 and over, adult: Secondary | ICD-10-CM | POA: Diagnosis not present

## 2018-05-07 MED ORDER — BUPROPION HCL ER (SR) 150 MG PO TB12: 150.0000 mg | ORAL_TABLET | Freq: Every day | ORAL | 0 refills | Status: DC

## 2018-05-07 MED ORDER — METFORMIN HCL 500 MG PO TABS: 500.0000 mg | ORAL_TABLET | Freq: Two times a day (BID) | ORAL | 0 refills | Status: DC

## 2018-05-07 NOTE — Progress Notes (Signed)
Office: [phone]  /  Fax: [phone] TeleHealth Visit:  Kelly Fry has consented to this TeleHealth visit today via Skype. The patient is located at home, the provider is located at the [facility] office. The participants in this visit include the listed provider and patient.  HPI:   Chief Complaint: OBESITY Kelly Fry is here to discuss her progress with her obesity treatment plan. She is on the Category 2 plan and is following her eating plan approximately 50 % of the time. She states she is more active since her last visit with yard work and yard clean up. Kelly Fry recently moved and is trying to socially isolate with her family. She is struggling to stay on track with her diet prescription, but is still being mindful and trying to portion control.  We were unable to weight the patient today for this TeleHealth visit.She feels as if she has maintained weight since her last visit. She has lost 23 lbs since starting treatment with [place].  Insulin Resistance Kelly Fry has a diagnosis of insulin resistance based on her elevated fasting insulin level >5. Although Kelly Fry's blood glucose readings are still under good control, insulin resistance puts her at greater risk of metabolic syndrome and diabetes. She is taking metformin and doing well, but notes increased afternoon polyphagia especially with increased simple carbs. She continues to work on diet and exercise to decrease risk of diabetes.  Depression with emotional eating behaviors Kelly Fry's mood is stable on Wellbutrin. She notes increased stress eating using food for comfort to the extent that it is negatively impacting her health, but she is trying to be mindful. She has been working on behavior modification techniques to help reduce her emotional eating and has been somewhat successful. She denies insomnia and palpitations.  ASSESSMENT AND PLAN:  Insulin resistance  Other depression - with emotional eating - Plan:  buPROPion (WELLBUTRIN SR) 150 MG 12 hr tablet  Class 3 severe obesity with serious comorbidity and body mass index (BMI) of 45.0 to 49.9 in adult, unspecified obesity type ([place])  Prediabetes - Plan: metFORMIN (GLUCOPHAGE) 500 MG tablet  Other depression - with emotional eating  - Plan: buPROPion (WELLBUTRIN SR) 150 MG 12 hr tablet  PLAN:  Insulin Resistance Kelly Fry will continue to work on weight loss, exercise, and decreasing simple carbohydrates in her diet to help decrease the risk of diabetes. She was informed that eating too many simple carbohydrates or too many calories at one sitting increases the likelihood of GI side effects. Kelly Fry agreed to get back to her diet prescription and to increase her metformin 500 mg BID #60 with no refills and prescription was written today. Kelly Fry agreed to follow up with [place] as directed to monitor her progress in 3 weeks.  Depression with Emotional Eating Behaviors We discussed behavior modification techniques today to help Kelly Fry deal with her emotional eating and depression. She has agreed to take Wellbutrin SR 150 mg qd #30 with no refills and agreed to follow up as directed.  Obesity Jinelle is currently in the action stage of change. As such, her goal is to continue with weight loss efforts. She has agreed to follow the Category 2 plan. Carlisha has been instructed to work up to a goal of 150 minutes of combined cardio and strengthening exercise per week for weight loss and overall health benefits. We discussed the following Behavioral Modification Strategies today: decreasing simple carbohydrates, work on meal planning and easy cooking plans, emotional eating strategies, ways to avoid  boredom eating, and ways to avoid night time snacking.  Mary has agreed to follow up with our clinic in 3 weeks. She was informed of the importance of frequent follow up visits to maximize her success with intensive lifestyle modifications for her multiple health  conditions.  ALLERGIES: Allergies  Allergen Reactions  . Latuda [Lurasidone] Other (See Comments)    Shaky; worsened mood    MEDICATIONS: Current Outpatient Medications on File Prior to Visit  Medication Sig Dispense Refill  . albuterol (PROVENTIL HFA;VENTOLIN HFA) 108 (90 Base) MCG/ACT inhaler Inhale 2 puffs into the lungs every 6 (six) hours as needed for wheezing or shortness of breath. 1 Inhaler 2  . Cholecalciferol (VITAMIN D-3) 125 MCG (5000 UT) TABS Take 5,000 Units by mouth daily. 30 tablet 0  . hydrochlorothiazide (MICROZIDE) 12.5 MG capsule Take 1 capsule (12.5 mg total) by mouth daily. 90 capsule 1   No current facility-administered medications on file prior to visit.     PAST MEDICAL HISTORY: Past Medical History:  Diagnosis Date  . Ankle instability, right   . Anxiety   . Back pain   . Bipolar disorder ([place])   . Complication of anesthesia   . Depression   . Dyspnea   . Family history of adverse reaction to anesthesia    Dad had n/v post anesthesia  . GERD (gastroesophageal reflux disease)   . Leg edema   . Medical history non-contributory   . Miscarriage   . PONV (postoperative nausea and vomiting)   . Vitamin D deficiency     PAST SURGICAL HISTORY: Past Surgical History:  Procedure Laterality Date  . ANKLE RECONSTRUCTION Right 02/21/2017   Procedure: RIGHT LATERAL ANKLE RECONSTRUCTION WITH SPLIT PERONEAL LONGUS TENDON;  Surgeon: Kelly Killings, MD;  Location: [place];  Service: Orthopedics;  Laterality: Right;  . NO PAST SURGERIES    . WISDOM TOOTH EXTRACTION  04/12/2015    SOCIAL HISTORY: Social History   Tobacco Use  . Smoking status: Never Smoker  . Smokeless tobacco: Never Used  Substance Use Topics  . Alcohol use: No  . Drug use: No    FAMILY HISTORY: Family History  Problem Relation Age of Onset  . Kidney disease Father   . COPD Father   . Hypertension Father   . Depression Father   . Sleep apnea Father   . Obesity Father   . Kidney  disease Sister   . Asthma Sister   . Early death Mother   . Stroke Mother   . Heart disease Mother   . Hypertension Mother   . Alcoholism Mother   . Asthma Brother     ROS: Review of Systems  Cardiovascular: Negative for palpitations.  Endo/Heme/Allergies:       Positive for polyphagia.  Psychiatric/Behavioral: Positive for depression. The patient does not have insomnia.     PHYSICAL EXAM: Pt in no acute distress  RECENT LABS AND TESTS: BMET    Component Value Date/Time   NA 142 03/05/2018 1127   K 4.1 03/05/2018 1127   CL 102 03/05/2018 1127   CO2 19 (L) 03/05/2018 1127   GLUCOSE 80 03/05/2018 1127   GLUCOSE 91 02/19/2017 0935   GLUCOSE 72 08/30/2013 1342   BUN 11 03/05/2018 1127   CREATININE 0.94 03/05/2018 1127   CREATININE 0.73 11/09/2013 1509   CALCIUM 9.3 03/05/2018 1127   GFRNONAA 82 03/05/2018 1127   GFRAA 94 03/05/2018 1127   Lab Results  Component Value Date   HGBA1C 5.1  03/05/2018   HGBA1C 5.0 12/02/2017   HGBA1C 5.2 09/02/2017   HGBA1C 5.0 04/18/2016   Lab Results  Component Value Date   INSULIN 19.7 03/05/2018   INSULIN 20.3 12/02/2017   INSULIN 42.7 (H) 09/02/2017   CBC    Component Value Date/Time   WBC 7.3 09/02/2017 0942   WBC 7.6 02/19/2017 0935   RBC 5.18 09/02/2017 0942   RBC 4.95 02/19/2017 0935   HGB 15.3 09/02/2017 0942   HCT 45.5 09/02/2017 0942   PLT 247 02/19/2017 0935   PLT 240 04/18/2016 0832   MCV 88 09/02/2017 0942   MCH 29.5 09/02/2017 0942   MCH 28.5 02/19/2017 0935   MCHC 33.6 09/02/2017 0942   MCHC 32.6 02/19/2017 0935   RDW 13.3 09/02/2017 0942   LYMPHSABS 1.5 09/02/2017 0942   MONOABS 0.4 06/01/2013 0956   EOSABS 0.1 09/02/2017 0942   BASOSABS 0.0 09/02/2017 0942   Iron/TIBC/Ferritin/ %Sat    Component Value Date/Time   IRON 101 06/30/2015 1007   TIBC 360 06/30/2015 1007   FERRITIN 56 06/30/2015 1007   IRONPCTSAT 28 06/30/2015 1007   Lipid Panel     Component Value Date/Time   CHOL 165 03/05/2018  1127   TRIG 74 03/05/2018 1127   HDL 43 03/05/2018 1127   CHOLHDL 3.5 06/30/2015 1007   LDLCALC 107 (H) 03/05/2018 1127   LDLDIRECT 118 (H) 06/24/2017 1633   Hepatic Function Panel     Component Value Date/Time   PROT 7.2 03/05/2018 1127   ALBUMIN 4.4 03/05/2018 1127   AST 16 03/05/2018 1127   ALT 17 03/05/2018 1127   ALKPHOS 95 03/05/2018 1127   BILITOT 0.3 03/05/2018 1127      Component Value Date/Time   TSH 2.760 09/02/2017 0942   TSH 2.880 06/24/2017 1633   TSH 2.740 06/30/2015 1007   Results for BENA, KOBEL (MRN [id number]) as of 05/07/2018 15:19  Ref. Range 03/05/2018 11:27  Vitamin D, 25-Hydroxy Latest Ref Range: 30.0 - 100.0 ng/mL 48.4     I, Marcille Blanco, CMA, am acting as transcriptionist for Starlyn Skeans, MD I have reviewed the above documentation for accuracy and completeness, and I agree with the above. -Dennard Nip, MD

## 2018-05-14 IMAGING — DX DG ANKLE COMPLETE 3+V*R*
3 series · 3 of 3 positions shown · non-contrast
Comparison: 07/05/2015

CLINICAL DATA: Twisted ankle.  No time course given.

EXAM:
RIGHT ANKLE - COMPLETE 3+ VIEW

[ankle ap]
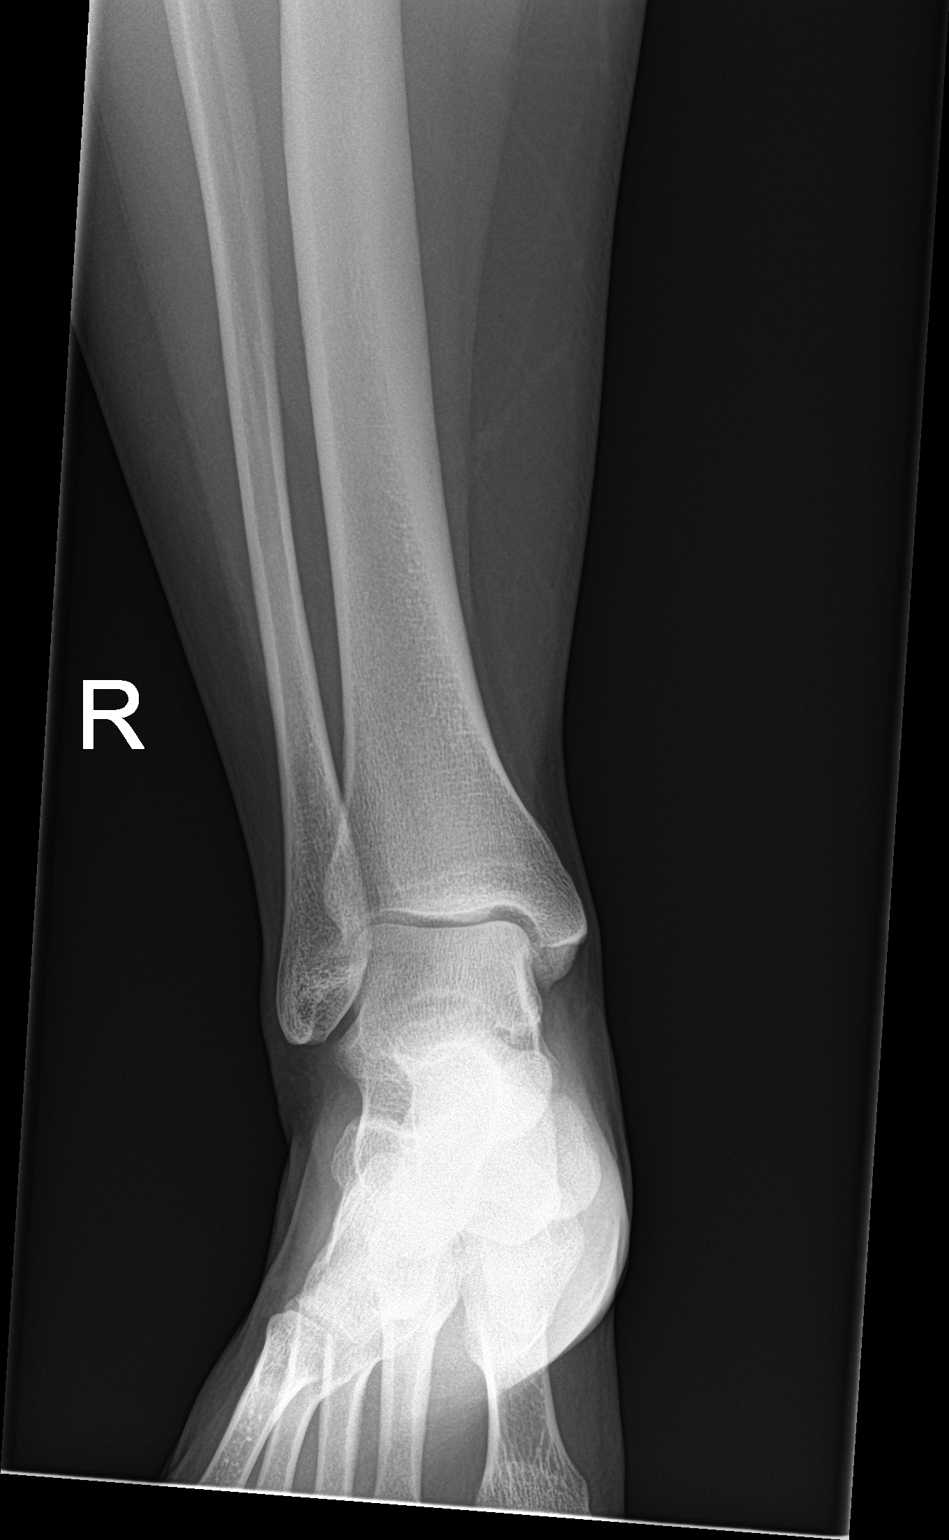

[ankle obl]
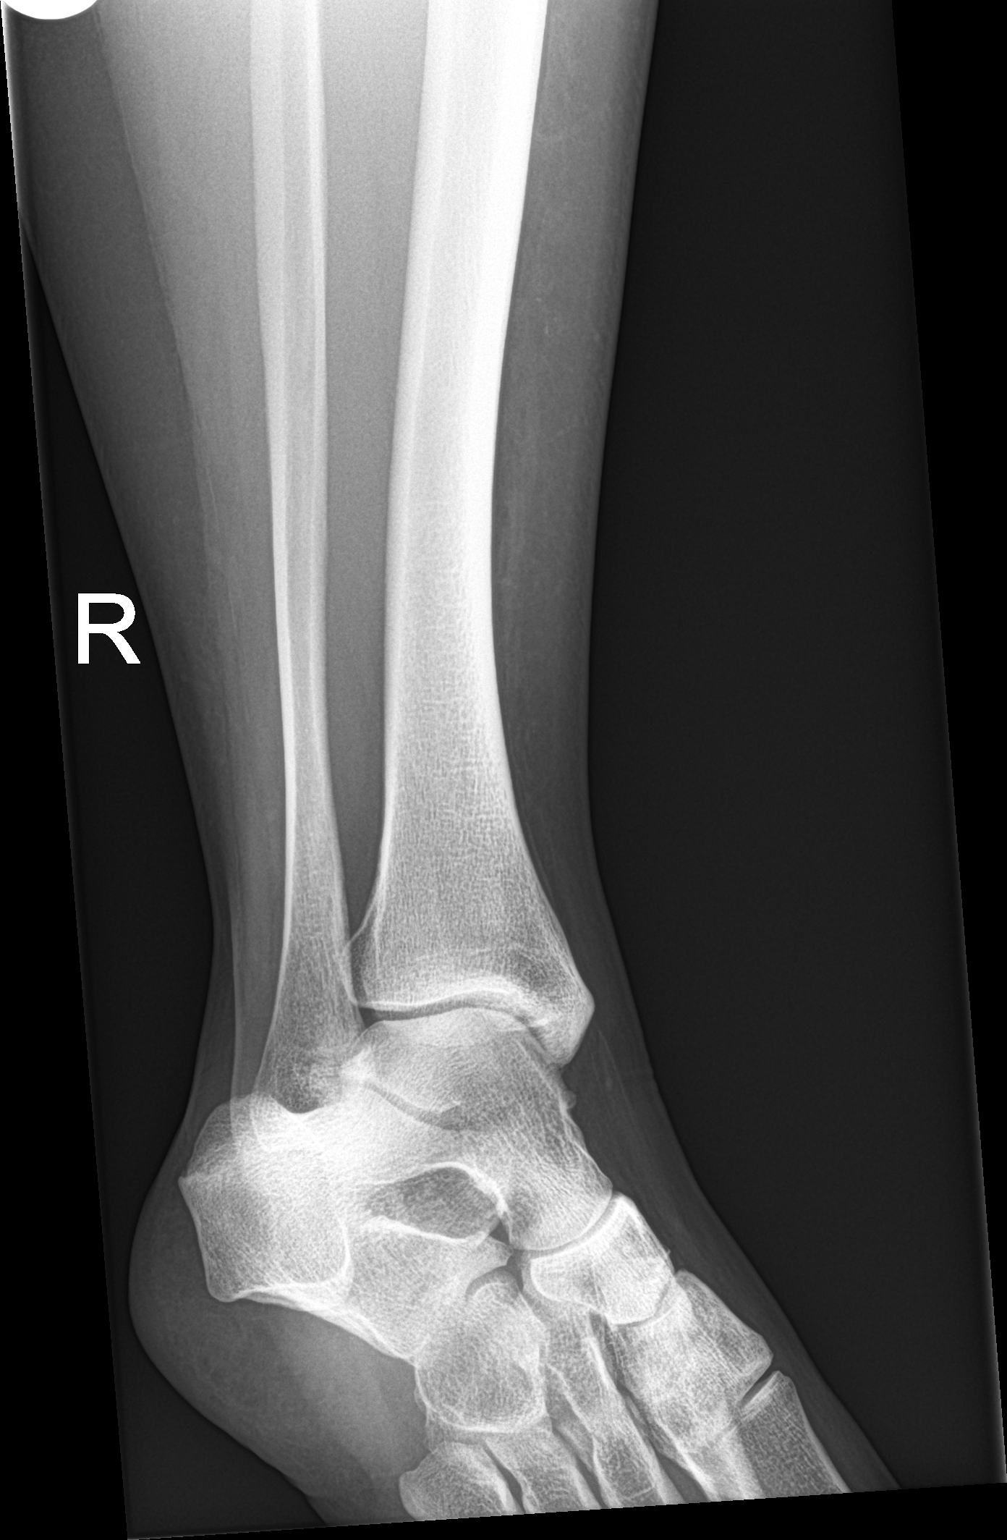

[ankle lat]
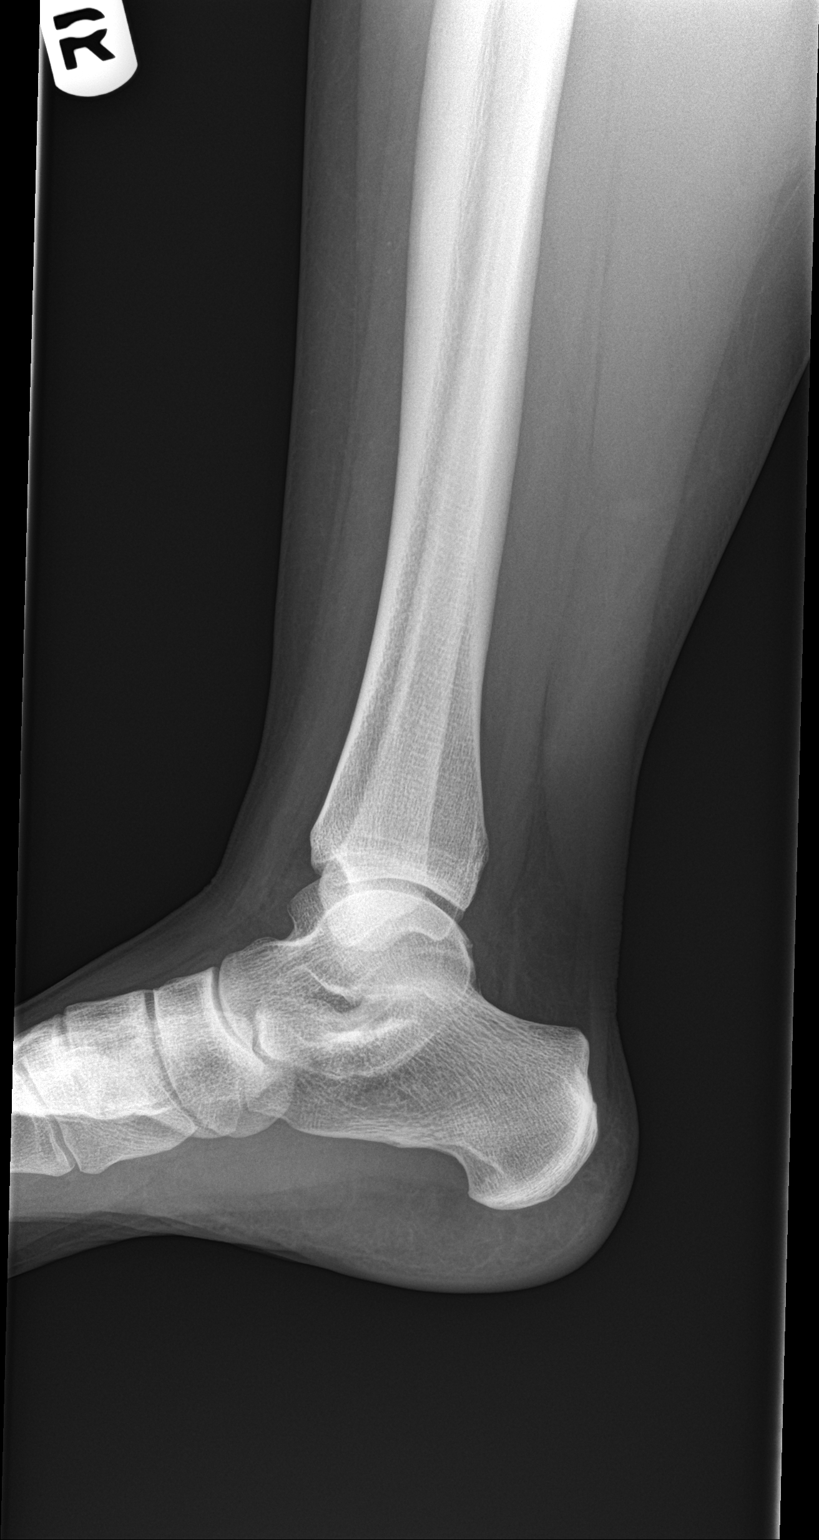

[3 of 3 positions shown; findings below may reference images not displayed]

FINDINGS: The ankle mortise is maintained. No acute ankle fracture or
osteochondral abnormality. The mid and hindfoot bony structures are
intact.
IMPRESSION: No acute bony findings.

## 2018-05-26 ENCOUNTER — Encounter (INDEPENDENT_AMBULATORY_CARE_PROVIDER_SITE_OTHER): Payer: Self-pay | Admitting: Family Medicine

## 2018-05-26 ENCOUNTER — Ambulatory Visit (INDEPENDENT_AMBULATORY_CARE_PROVIDER_SITE_OTHER): Payer: Medicaid Other | Admitting: Family Medicine

## 2018-05-26 ENCOUNTER — Other Ambulatory Visit: Payer: Self-pay

## 2018-05-26 DIAGNOSIS — R7303 Prediabetes: Secondary | ICD-10-CM | POA: Diagnosis not present

## 2018-05-26 DIAGNOSIS — F3289 Other specified depressive episodes: Secondary | ICD-10-CM

## 2018-05-26 DIAGNOSIS — Z6841 Body Mass Index (BMI) 40.0 and over, adult: Secondary | ICD-10-CM | POA: Diagnosis not present

## 2018-05-26 MED ORDER — BUPROPION HCL ER (SR) 150 MG PO TB12: 150.0000 mg | ORAL_TABLET | Freq: Every day | ORAL | 0 refills | Status: DC

## 2018-05-26 MED ORDER — METFORMIN HCL 500 MG PO TABS: 500.0000 mg | ORAL_TABLET | Freq: Two times a day (BID) | ORAL | 0 refills | Status: DC

## 2018-05-26 NOTE — Progress Notes (Signed)
Office: [phone]  /  Fax: [phone] TeleHealth Visit:  Kelly Fry has verbally consented to this TeleHealth visit today. The patient is located at home, the provider is located at the [facility] office. The participants in this visit include the listed provider and patient. The visit was conducted today via Skype.  HPI:   Chief Complaint: OBESITY Kelly Fry is here to discuss her progress with her obesity treatment plan. She is on the Category 2 plan and is following her eating plan approximately 50 % of the time. She states she is walking 20 minutes 3 times per week. Kelly Fry feels that she has done better with her new plan and may have lost another 1 to 2 pounds in the last 2 weeks. She is doing better finding groceries and meal planning with her decreased stress eating.  We were unable to weigh the patient today for this TeleHealth visit. She feels as if she has lost weight since her last visit. She has lost 23 lbs since starting treatment with [place].  Pre-Diabetes Kelly Fry has a diagnosis of pre-diabetes based on her elevated Hgb A1c and was informed this puts her at greater risk of developing diabetes. She is taking metformin and is currently well controlled and she is doing better on her diet prescription. Kelly Fry notes decreased polyphagia and continues to work on diet and exercise to decrease risk of diabetes. She denies nausea, vomiting, or hypoglycemia.   Depression with emotional eating behaviors Kelly Fry's mood has improved since her last TeleHealth visit. She is doing more gardening and staying occupied. She has decreased stress eating and is sleeping well overall. She is struggling with emotional eating and using food for comfort to the extent that it is negatively impacting her health. She often snacks when she is not hungry. Kelly Fry sometimes feels she is out of control and then feels guilty that she made poor food choices. She has been working on behavior  modification techniques to help reduce her emotional eating and has been somewhat successful. She shows no sign of suicidal or homicidal ideations.  ASSESSMENT AND PLAN:  Prediabetes - Plan: metFORMIN (GLUCOPHAGE) 500 MG tablet  Other depression - with emotional eating  - Plan: buPROPion (WELLBUTRIN SR) 150 MG 12 hr tablet  Class 3 severe obesity with serious comorbidity and body mass index (BMI) of 45.0 to 49.9 in adult, unspecified obesity type [facility])  PLAN:  Pre-Diabetes Kelly Fry will continue to work on weight loss, exercise, and decreasing simple carbohydrates in her diet to help decrease the risk of diabetes. She was informed that eating too many simple carbohydrates or too many calories at one sitting increases the likelihood of GI side effects. Kelly Fry agreed to continue metformin 500 mg BID #60 with no refills and a prescription was written today. Kelly Fry agreed to follow up with [place] as directed to monitor her progress in 3 weeks.   Depression with Emotional Eating Behaviors We discussed behavior modification techniques today to help Kelly Fry deal with her emotional eating and depression. She has agreed to take Wellbutrin SR 150 mg qd #30 with no refills and agreed to follow up as directed.  Obesity Kelly Fry is currently in the action stage of change. As such, her goal is to continue with weight loss efforts. She has agreed to follow the Category 2 plan. Kelly Fry has been instructed to work up to a goal of 150 minutes of combined cardio and strengthening exercise per week for weight loss and overall health benefits. We discussed  the following Behavioral Modification Strategies today: emotional eating strategies, ways to avoid boredom eating, and ways to avoid night time snacking.  Kelly Fry has agreed to follow up with our clinic in 3 weeks. She was informed of the importance of frequent follow up visits to maximize her success with intensive lifestyle modifications for her multiple health  conditions.  ALLERGIES: Allergies  Allergen Reactions  . Latuda [Lurasidone] Other (See Comments)    Shaky; worsened mood    MEDICATIONS: Current Outpatient Medications on File Prior to Visit  Medication Sig Dispense Refill  . albuterol (PROVENTIL HFA;VENTOLIN HFA) 108 (90 Base) MCG/ACT inhaler Inhale 2 puffs into the lungs every 6 (six) hours as needed for wheezing or shortness of breath. 1 Inhaler 2  . buPROPion (WELLBUTRIN SR) 150 MG 12 hr tablet Take 1 tablet (150 mg total) by mouth daily. 30 tablet 0  . Cholecalciferol (VITAMIN D-3) 125 MCG (5000 UT) TABS Take 5,000 Units by mouth daily. 30 tablet 0  . hydrochlorothiazide (MICROZIDE) 12.5 MG capsule Take 1 capsule (12.5 mg total) by mouth daily. 90 capsule 1  . metFORMIN (GLUCOPHAGE) 500 MG tablet Take 1 tablet (500 mg total) by mouth 2 (two) times daily with a meal. 60 tablet 0   No current facility-administered medications on file prior to visit.     PAST MEDICAL HISTORY: Past Medical History:  Diagnosis Date  . Ankle instability, right   . Anxiety   . Back pain   . Bipolar disorder ([place])   . Complication of anesthesia   . Depression   . Dyspnea   . Family history of adverse reaction to anesthesia    Dad had n/v post anesthesia  . GERD (gastroesophageal reflux disease)   . Leg edema   . Medical history non-contributory   . Miscarriage   . PONV (postoperative nausea and vomiting)   . Vitamin D deficiency     PAST SURGICAL HISTORY: Past Surgical History:  Procedure Laterality Date  . ANKLE RECONSTRUCTION Right 02/21/2017   Procedure: RIGHT LATERAL ANKLE RECONSTRUCTION WITH SPLIT PERONEAL LONGUS TENDON;  Surgeon: Marybelle Killings, MD;  Location: [facility];  Service: Orthopedics;  Laterality: Right;  . NO PAST SURGERIES    . WISDOM TOOTH EXTRACTION  04/12/2015    SOCIAL HISTORY: Social History   Tobacco Use  . Smoking status: Never Smoker  . Smokeless tobacco: Never Used  Substance Use Topics  . Alcohol use: No  .  Drug use: No    FAMILY HISTORY: Family History  Problem Relation Age of Onset  . Kidney disease Father   . COPD Father   . Hypertension Father   . Depression Father   . Sleep apnea Father   . Obesity Father   . Kidney disease Sister   . Asthma Sister   . Early death Mother   . Stroke Mother   . Heart disease Mother   . Hypertension Mother   . Alcoholism Mother   . Asthma Brother     ROS: ROS  PHYSICAL EXAM: Pt in no acute distress  RECENT LABS AND TESTS: BMET    Component Value Date/Time   NA 142 03/05/2018 1127   K 4.1 03/05/2018 1127   CL 102 03/05/2018 1127   CO2 19 (L) 03/05/2018 1127   GLUCOSE 80 03/05/2018 1127   GLUCOSE 91 02/19/2017 0935   GLUCOSE 72 08/30/2013 1342   BUN 11 03/05/2018 1127   CREATININE 0.94 03/05/2018 1127   CREATININE 0.73 11/09/2013 1509   CALCIUM  9.3 03/05/2018 1127   GFRNONAA 82 03/05/2018 1127   GFRAA 94 03/05/2018 1127   Lab Results  Component Value Date   HGBA1C 5.1 03/05/2018   HGBA1C 5.0 12/02/2017   HGBA1C 5.2 09/02/2017   HGBA1C 5.0 04/18/2016   Lab Results  Component Value Date   INSULIN 19.7 03/05/2018   INSULIN 20.3 12/02/2017   INSULIN 42.7 (H) 09/02/2017   CBC    Component Value Date/Time   WBC 7.3 09/02/2017 0942   WBC 7.6 02/19/2017 0935   RBC 5.18 09/02/2017 0942   RBC 4.95 02/19/2017 0935   HGB 15.3 09/02/2017 0942   HCT 45.5 09/02/2017 0942   PLT 247 02/19/2017 0935   PLT 240 04/18/2016 0832   MCV 88 09/02/2017 0942   MCH 29.5 09/02/2017 0942   MCH 28.5 02/19/2017 0935   MCHC 33.6 09/02/2017 0942   MCHC 32.6 02/19/2017 0935   RDW 13.3 09/02/2017 0942   LYMPHSABS 1.5 09/02/2017 0942   MONOABS 0.4 06/01/2013 0956   EOSABS 0.1 09/02/2017 0942   BASOSABS 0.0 09/02/2017 0942   Iron/TIBC/Ferritin/ %Sat    Component Value Date/Time   IRON 101 06/30/2015 1007   TIBC 360 06/30/2015 1007   FERRITIN 56 06/30/2015 1007   IRONPCTSAT 28 06/30/2015 1007   Lipid Panel     Component Value  Date/Time   CHOL 165 03/05/2018 1127   TRIG 74 03/05/2018 1127   HDL 43 03/05/2018 1127   CHOLHDL 3.5 06/30/2015 1007   LDLCALC 107 (H) 03/05/2018 1127   LDLDIRECT 118 (H) 06/24/2017 1633   Hepatic Function Panel     Component Value Date/Time   PROT 7.2 03/05/2018 1127   ALBUMIN 4.4 03/05/2018 1127   AST 16 03/05/2018 1127   ALT 17 03/05/2018 1127   ALKPHOS 95 03/05/2018 1127   BILITOT 0.3 03/05/2018 1127      Component Value Date/Time   TSH 2.760 09/02/2017 0942   TSH 2.880 06/24/2017 1633   TSH 2.740 06/30/2015 1007    Results for AZALEAH, USMAN (MRN [id number]) as of 05/26/2018 11:20  Ref. Range 03/05/2018 11:27  Vitamin D, 25-Hydroxy Latest Ref Range: 30.0 - 100.0 ng/mL 48.4    I, Marcille Blanco, CMA, am acting as transcriptionist for Starlyn Skeans, MD I have reviewed the above documentation for accuracy and completeness, and I agree with the above. -Dennard Nip, MD

## 2018-05-27 ENCOUNTER — Ambulatory Visit: Payer: Medicaid Other | Admitting: Family Medicine

## 2018-06-15 ENCOUNTER — Ambulatory Visit (INDEPENDENT_AMBULATORY_CARE_PROVIDER_SITE_OTHER): Payer: Medicaid Other | Admitting: Family Medicine

## 2018-06-15 ENCOUNTER — Other Ambulatory Visit: Payer: Self-pay

## 2018-06-15 ENCOUNTER — Encounter (INDEPENDENT_AMBULATORY_CARE_PROVIDER_SITE_OTHER): Payer: Self-pay | Admitting: Family Medicine

## 2018-06-15 DIAGNOSIS — Z6841 Body Mass Index (BMI) 40.0 and over, adult: Secondary | ICD-10-CM

## 2018-06-15 DIAGNOSIS — R5383 Other fatigue: Secondary | ICD-10-CM | POA: Diagnosis not present

## 2018-06-16 NOTE — Progress Notes (Signed)
Office: [phone]  /  Fax: [phone] TeleHealth Visit:  Kelly Fry has verbally consented to this TeleHealth visit today. The patient is located at home, the provider is located at the [facility] office. The participants in this visit include the listed provider, and patient. The visit was conducted today via [email or web address].  HPI:   Chief Complaint: OBESITY Kelly Fry is here to discuss her progress with her obesity treatment plan. She is on the Category 2 plan and is following her eating plan approximately 75 % of the time. She states she is doing low impact cardio video for 20 minutes 3 times per week. Kelly Fry states that she is doing well maintaining her weight. She is avoiding snacking and is keeping herself occupied, sometimes forgetting to eat at lunchtime. Her hunger is controlled and she has added in exercise.  We were unable to weigh the patient today for this TeleHealth visit. She feels unsure if she has lost weight since her last visit. She has lost 23 lbs since starting treatment with Kelly Fry.  Fatigue Kelly Fry's children are with her full time due to [place] and she has been busier and not sleeping as well recently. She notes an increase in fatigue, but feels that she is handling this well.   ASSESSMENT AND PLAN:  Other fatigue  Class 3 severe obesity with serious comorbidity and body mass index (BMI) of 45.0 to 49.9 in adult, unspecified obesity type (Kelly Fry)  PLAN:  Fatigue Kelly Fry was informed that her fatigue may be related to obesity, depression, or many other causes. Kelly Fry has agreed to work on diet, exercise, and weight loss to help with fatigue. Proper sleep hygiene was discussed including the need for 7-8 hours of quality sleep each night. We discussed stress reducing techniques and ways to improve sleep quality as well as continues weight loss efforts.  Kelly Fry spent > than 50% of the 15 minute visit on counseling as documented in the note.  Obesity Kelly Fry is  currently in the action stage of change. As such, her goal is to continue with weight loss efforts. She has agreed to follow the Category 2 plan. Kelly Fry has been instructed to work up to a goal of 150 minutes of combined cardio and strengthening exercise per week for weight loss and overall health benefits. We discussed the following Behavioral Modification Strategies today: emotional eating strategies, better snacking choices, and ways to avoid boredom eating.  Kelly Fry has agreed to follow up with our clinic in 2 weeks. She was informed of the importance of frequent follow up visits to maximize her success with intensive lifestyle modifications for her multiple health conditions.  ALLERGIES: Allergies  Allergen Reactions  . Latuda [Lurasidone] Other (See Comments)    Shaky; worsened mood    MEDICATIONS: Current Outpatient Medications on File Prior to Visit  Medication Sig Dispense Refill  . albuterol (PROVENTIL HFA;VENTOLIN HFA) 108 (90 Base) MCG/ACT inhaler Inhale 2 puffs into the lungs every 6 (six) hours as needed for wheezing or shortness of breath. 1 Inhaler 2  . buPROPion (WELLBUTRIN SR) 150 MG 12 hr tablet Take 1 tablet (150 mg total) by mouth daily. 30 tablet 0  . Cholecalciferol (VITAMIN D-3) 125 MCG (5000 UT) TABS Take 5,000 Units by mouth daily. 30 tablet 0  . hydrochlorothiazide (MICROZIDE) 12.5 MG capsule Take 1 capsule (12.5 mg total) by mouth daily. 90 capsule 1  . metFORMIN (GLUCOPHAGE) 500 MG tablet Take 1 tablet (500 mg total) by mouth 2 (two) times daily  with a meal. 60 tablet 0   No current facility-administered medications on file prior to visit.     PAST MEDICAL HISTORY: Past Medical History:  Diagnosis Date  . Ankle instability, right   . Anxiety   . Back pain   . Bipolar disorder ([place])   . Complication of anesthesia   . Depression   . Dyspnea   . Family history of adverse reaction to anesthesia    Dad had n/v post anesthesia  . GERD (gastroesophageal  reflux disease)   . Leg edema   . Medical history non-contributory   . Miscarriage   . PONV (postoperative nausea and vomiting)   . Vitamin D deficiency     PAST SURGICAL HISTORY: Past Surgical History:  Procedure Laterality Date  . ANKLE RECONSTRUCTION Right 02/21/2017   Procedure: RIGHT LATERAL ANKLE RECONSTRUCTION WITH SPLIT PERONEAL LONGUS TENDON;  Surgeon: Marybelle Killings, MD;  Location: [facility];  Service: Orthopedics;  Laterality: Right;  . NO PAST SURGERIES    . WISDOM TOOTH EXTRACTION  04/12/2015    SOCIAL HISTORY: Social History   Tobacco Use  . Smoking status: Never Smoker  . Smokeless tobacco: Never Used  Substance Use Topics  . Alcohol use: No  . Drug use: No    FAMILY HISTORY: Family History  Problem Relation Age of Onset  . Kidney disease Father   . COPD Father   . Hypertension Father   . Depression Father   . Sleep apnea Father   . Obesity Father   . Kidney disease Sister   . Asthma Sister   . Early death Mother   . Stroke Mother   . Heart disease Mother   . Hypertension Mother   . Alcoholism Mother   . Asthma Brother     ROS: Review of Systems  Constitutional: Positive for malaise/fatigue.    PHYSICAL EXAM: Pt in no acute distress  RECENT LABS AND TESTS: BMET    Component Value Date/Time   NA 142 03/05/2018 1127   K 4.1 03/05/2018 1127   CL 102 03/05/2018 1127   CO2 19 (L) 03/05/2018 1127   GLUCOSE 80 03/05/2018 1127   GLUCOSE 91 02/19/2017 0935   GLUCOSE 72 08/30/2013 1342   BUN 11 03/05/2018 1127   CREATININE 0.94 03/05/2018 1127   CREATININE 0.73 11/09/2013 1509   CALCIUM 9.3 03/05/2018 1127   GFRNONAA 82 03/05/2018 1127   GFRAA 94 03/05/2018 1127   Lab Results  Component Value Date   HGBA1C 5.1 03/05/2018   HGBA1C 5.0 12/02/2017   HGBA1C 5.2 09/02/2017   HGBA1C 5.0 04/18/2016   Lab Results  Component Value Date   INSULIN 19.7 03/05/2018   INSULIN 20.3 12/02/2017   INSULIN 42.7 (H) 09/02/2017   CBC    Component  Value Date/Time   WBC 7.3 09/02/2017 0942   WBC 7.6 02/19/2017 0935   RBC 5.18 09/02/2017 0942   RBC 4.95 02/19/2017 0935   HGB 15.3 09/02/2017 0942   HCT 45.5 09/02/2017 0942   PLT 247 02/19/2017 0935   PLT 240 04/18/2016 0832   MCV 88 09/02/2017 0942   MCH 29.5 09/02/2017 0942   MCH 28.5 02/19/2017 0935   MCHC 33.6 09/02/2017 0942   MCHC 32.6 02/19/2017 0935   RDW 13.3 09/02/2017 0942   LYMPHSABS 1.5 09/02/2017 0942   MONOABS 0.4 06/01/2013 0956   EOSABS 0.1 09/02/2017 0942   BASOSABS 0.0 09/02/2017 0942   Iron/TIBC/Ferritin/ %Sat    Component Value Date/Time  IRON 101 06/30/2015 1007   TIBC 360 06/30/2015 1007   FERRITIN 56 06/30/2015 1007   IRONPCTSAT 28 06/30/2015 1007   Lipid Panel     Component Value Date/Time   CHOL 165 03/05/2018 1127   TRIG 74 03/05/2018 1127   HDL 43 03/05/2018 1127   CHOLHDL 3.5 06/30/2015 1007   LDLCALC 107 (H) 03/05/2018 1127   LDLDIRECT 118 (H) 06/24/2017 1633   Hepatic Function Panel     Component Value Date/Time   PROT 7.2 03/05/2018 1127   ALBUMIN 4.4 03/05/2018 1127   AST 16 03/05/2018 1127   ALT 17 03/05/2018 1127   ALKPHOS 95 03/05/2018 1127   BILITOT 0.3 03/05/2018 1127      Component Value Date/Time   TSH 2.760 09/02/2017 0942   TSH 2.880 06/24/2017 1633   TSH 2.740 06/30/2015 1007   Results for KAYLANY, TESORIERO (MRN [id number]) as of 06/16/2018 09:41  Ref. Range 03/05/2018 11:27  Vitamin D, 25-Hydroxy Latest Ref Range: 30.0 - 100.0 ng/mL 48.4    Kelly Fry, Marcille Blanco, CMA, am acting as transcriptionist for Starlyn Skeans, MD Kelly Fry have reviewed the above documentation for accuracy and completeness, and Kelly Fry agree with the above. -Dennard Nip, MD

## 2018-06-22 ENCOUNTER — Ambulatory Visit (INDEPENDENT_AMBULATORY_CARE_PROVIDER_SITE_OTHER): Payer: Medicaid Other

## 2018-06-22 ENCOUNTER — Ambulatory Visit: Payer: Medicaid Other | Admitting: Family Medicine

## 2018-06-22 ENCOUNTER — Other Ambulatory Visit: Payer: Self-pay

## 2018-06-22 VITALS — BP 133/85 | HR 86 | Temp 98.9°F | Ht 64.0 in | Wt 279.0 lb

## 2018-06-22 DIAGNOSIS — S8012XA Contusion of left lower leg, initial encounter: Secondary | ICD-10-CM | POA: Diagnosis not present

## 2018-06-22 DIAGNOSIS — W19XXXA Unspecified fall, initial encounter: Secondary | ICD-10-CM | POA: Diagnosis not present

## 2018-06-22 DIAGNOSIS — M25562 Pain in left knee: Secondary | ICD-10-CM | POA: Diagnosis not present

## 2018-06-22 MED ORDER — NAPROXEN 500 MG PO TABS: 500.0000 mg | ORAL_TABLET | Freq: Two times a day (BID) | ORAL | 0 refills | Status: DC

## 2018-06-22 NOTE — Progress Notes (Signed)
Subjective: CC: fall PCP: Kelly Gouty, FNP Kelly Fry is a 33 y.o. female presenting to clinic today for:  1. Knee pain Patient reports that she fell 6 days ago when she was standing on some furniture and fell where her left lower extremity was wedged between 2 pieces of furniture.  She notes that she had pain and swelling following the fall.  She has had ongoing intermittent burning shooting pain down the anterior aspect of left lower leg.  This typically occurs when she has been standing for a while and then goes to sit down.  She has been applying ice and elevating the lower extremity but this does not seem to be helping.  She is worried that she had fractured something and therefore came into the office today.   ROS: Per HPI  Allergies  Allergen Reactions  . Latuda [Lurasidone] Other (See Comments)    Shaky; worsened mood   Past Medical History:  Diagnosis Date  . Ankle instability, right   . Anxiety   . Back pain   . Bipolar disorder ([place])   . Complication of anesthesia   . Depression   . Dyspnea   . Family history of adverse reaction to anesthesia    Dad had n/v post anesthesia  . GERD (gastroesophageal reflux disease)   . Leg edema   . Medical history non-contributory   . Miscarriage   . PONV (postoperative nausea and vomiting)   . Vitamin D deficiency     Current Outpatient Medications:  .  albuterol (PROVENTIL HFA;VENTOLIN HFA) 108 (90 Base) MCG/ACT inhaler, Inhale 2 puffs into the lungs every 6 (six) hours as needed for wheezing or shortness of breath., Disp: 1 Inhaler, Rfl: 2 .  buPROPion (WELLBUTRIN SR) 150 MG 12 hr tablet, Take 1 tablet (150 mg total) by mouth daily., Disp: 30 tablet, Rfl: 0 .  hydrochlorothiazide (MICROZIDE) 12.5 MG capsule, Take 1 capsule (12.5 mg total) by mouth daily., Disp: 90 capsule, Rfl: 1 .  metFORMIN (GLUCOPHAGE) 500 MG tablet, Take 1 tablet (500 mg total) by mouth 2 (two) times daily with a meal., Disp: 60 tablet, Rfl: 0  Social History   Socioeconomic History  . Marital status: Married    Spouse name: Kelly Fry  . Number of children: 2  . Years of education: Not on file  . Highest education level: Not on file  Occupational History  . Occupation: Stay at [employer]  . Financial resource strain: Not on file  . Food insecurity:    Worry: Not on file    Inability: Not on file  . Transportation needs:    Medical: Not on file    Non-medical: Not on file  Tobacco Use  . Smoking status: Never Smoker  . Smokeless tobacco: Never Used  Substance and Sexual Activity  . Alcohol use: No  . Drug use: No  . Sexual activity: Yes    Birth control/protection: I.U.D.  Lifestyle  . Physical activity:    Days per week: Not on file    Minutes per session: Not on file  . Stress: Not on file  Relationships  . Social connections:    Talks on phone: Not on file    Gets together: Not on file    Attends religious service: Not on file    Active member of club or organization: Not on file    Attends meetings of clubs or organizations: Not on file    Relationship status: Not on  file  . Intimate partner violence:    Fear of current or ex partner: Not on file    Emotionally abused: Not on file    Physically abused: Not on file    Forced sexual activity: Not on file  Other Topics Concern  . Not on file  Social History Narrative  . Not on file   Family History  Problem Relation Age of Onset  . Kidney disease Father   . COPD Father   . Hypertension Father   . Depression Father   . Sleep apnea Father   . Obesity Father   . Kidney disease Sister   . Asthma Sister   . Early death Mother   . Stroke Mother   . Heart disease Mother   . Hypertension Mother   . Alcoholism Mother   . Asthma Brother     Objective: Office vital signs reviewed. BP 133/85   Pulse 86   Temp 98.9 F (37.2 C) (Oral)   Ht 5\' 4"  (1.626 m)   Wt 279 lb (126.6 kg)   BMI 47.89 kg/m   Physical Examination:  General:  Awake, alert, obese, No acute distress Extremities: warm, well perfused, No cyanosis or clubbing; +2 pulses bilaterally MSK: antalgic gait and station  Left lower extremity: Ecchymosis and soft tissue swelling noted along the medial aspect of the anterior left lower extremity extending to about mid shin from the knee.  She has no tenderness to palpation to the patella, patellar tendon, quad tendon or joint line.  No ligamentous laxity.  No crepitus.  She has full active range of motion.  She is able to ambulate independently Skin: Ecchymosis as above Neuro: Light touch and station grossly intact  Dg Knee 1-2 Views Left  Result Date: 06/22/2018 CLINICAL DATA:  33 year old female with a history of fall and knee pain EXAM: LEFT KNEE - 1-2 VIEW COMPARISON:  None. FINDINGS: No evidence of fracture, dislocation, or joint effusion. No evidence of arthropathy or other focal bone abnormality. Soft tissues are unremarkable. IMPRESSION: Negative. Electronically Signed   By: Kelly Fry D.O.   On: 06/22/2018 11:15    Assessment/ Plan: 33 y.o. female   1. Traumatic hematoma of left lower leg, initial encounter She likely has inflammation/mild nerve damage resulting in the burning sensation she is experiencing.  I suspect that much of this is mediated by the hematoma noted on exam.  X-ray was obtained of the patella given fall from height and there were no appreciable fractures or dislocations contributing.  Radiologist review confirms this as well.  I have advised her to continue ice, elevation.  Have added Naprosyn to take twice daily as needed pain and swelling.  If she has persistent burning pain despite resolution of swelling, could consider adding gabapentin.  She will follow-up in 3 to 4 weeks if needed for this. - naproxen (NAPROSYN) 500 MG tablet; Take 1 tablet (500 mg total) by mouth 2 (two) times daily with a meal. (if needed for pain/ swelling)  Dispense: 30 tablet; Refill: 0  2. Fall, initial  encounter As above - DG Knee 1-2 Views Left; Future   Orders Placed This Encounter  Procedures  . DG Knee 1-2 Views Left    Standing Status:   Future    Number of Occurrences:   1    Standing Expiration Date:   08/22/2019    Order Specific Question:   Reason for Exam (SYMPTOM  OR DIAGNOSIS REQUIRED)    Answer:   fall  Order Specific Question:   Is the patient pregnant?    Answer:   No    Order Specific Question:   Preferred imaging location?    Answer:   Internal   No orders of the defined types were placed in this encounter.    Janora Norlander, DO [place] [phone]

## 2018-06-22 NOTE — Patient Instructions (Signed)
X-ray did not demonstrate any fractures or dislocations.  The sensation you are experiencing in the leg is likely nerve damage related to the hematoma from the fall.  We discussed applying ice to the affected area.  I have ordered Naprosyn for you to take twice daily with a meal if needed for pain and swelling.  Keep leg elevated as much as possible.  If the burning sensation continues despite the leg going back to its normal self we can add something called gabapentin for nerve pain.  Follow-up with either myself or Sharyn Lull rakes in 3 to 4 weeks if you have ongoing symptoms.   Hematoma A hematoma is a collection of blood. A hematoma can happen:  Under the skin.  In an organ.  In a body space.  In a joint space.  In other tissues. The blood can thicken (clot) to form a lump that you can see and feel. The lump is often hard and may become sore and tender. The lump can be very small or very big. Most hematomas get better in a few days to weeks. However, some hematomas may be serious and need medical care. What are the causes? This condition is caused by:  An injury.  Blood that leaks under the skin.  Problems from surgeries.  Medical conditions that cause bleeding or bruising. What increases the risk? You are more likely to develop this condition if:  You are an older adult.  You use medicines that thin your blood. What are the signs or symptoms? Symptoms depend on where the hematoma is in your body.  If the hematoma is under the skin, there is: ? A firm lump on the body. ? Pain and tenderness in the area. ? Bruising. The skin above the lump may be blue, dark blue, purple-red, or yellowish.  If the hematoma is deep in the tissues or body spaces, there may be: ? Blood in the stomach. This may cause pain in the belly (abdomen), weakness, passing out (fainting), and shortness of breath. ? Blood in the head. This may cause a headache, weakness, trouble speaking or understanding  speech, or passing out. How is this diagnosed? This condition is diagnosed based on:  Your medical history.  A physical exam.  Imaging tests, such as ultrasound or CT scan.  Blood tests. How is this treated? Treatment depends on the cause, size, and location of the hematoma. Treatment may include:  Doing nothing. Many hematomas go away on their own without treatment.  Surgery or close monitoring. This may be needed for large hematomas or hematomas that affect the body's organs.  Medicines. These may be given if a medical condition caused the hematoma. Follow these instructions at home: Managing pain, stiffness, and swelling   If told, put ice on the area. ? Put ice in a plastic bag. ? Place a towel between your skin and the bag. ? Leave the ice on for 20 minutes, 2-3 times a day for the first two days.  If told, put heat on the affected area after putting ice on the area for two days. Use the heat source that your doctor tells you to use. This could be a moist heat pack or a heating pad. To do this: ? Place a towel between your skin and the heat source. ? Leave the heat on for 20-30 minutes. ? Remove the heat if your skin turns bright red. This is very important if you are unable to feel pain, heat, or cold. You may  have a greater risk of getting burned.  Raise (elevate) the affected area above the level of your heart while you are sitting or lying down.  Wrap the affected area with an elastic bandage, if told by your doctor. Do not wrap the bandage too tightly.  If your hematoma is on a leg or foot and is painful, your doctor may give you crutches. Use them as told by your doctor. General instructions  Take over-the-counter and prescription medicines only as told by your doctor.  Keep all follow-up visits as told by your doctor. This is important. Contact a doctor if:  You have a fever.  The swelling or bruising gets worse.  You start to get more hematomas. Get help  right away if:  Your pain gets worse.  Your pain is not getting better with medicine.  Your skin over the hematoma breaks or starts to bleed.  Your hematoma is in your chest or belly and you: ? Pass out. ? Feel weak. ? Become short of breath.  You have a hematoma on your scalp that is caused by a fall or injury, and you: ? Have a headache that gets worse. ? Have trouble speaking or understanding speech. ? Become less alert or you pass out. Summary  A hematoma is a collection of blood in any part of your body.  Most hematomas get better on their own in a few days to weeks. Some may need medical care.  Follow instructions from your doctor about how to care for your hematoma.  Contact a doctor if the swelling or bruising gets worse, or if you are short of breath. This information is not intended to replace advice given to you by your health care provider. Make sure you discuss any questions you have with your health care provider. Document Released: 03/07/2004 Document Revised: 07/03/2017 Document Reviewed: 07/03/2017 Elsevier Interactive Patient Education  2019 [employer].

## 2018-06-30 ENCOUNTER — Encounter (INDEPENDENT_AMBULATORY_CARE_PROVIDER_SITE_OTHER): Payer: Self-pay | Admitting: Family Medicine

## 2018-06-30 ENCOUNTER — Other Ambulatory Visit: Payer: Self-pay

## 2018-06-30 ENCOUNTER — Ambulatory Visit (INDEPENDENT_AMBULATORY_CARE_PROVIDER_SITE_OTHER): Payer: Medicaid Other | Admitting: Family Medicine

## 2018-06-30 DIAGNOSIS — F3289 Other specified depressive episodes: Secondary | ICD-10-CM

## 2018-06-30 DIAGNOSIS — Z6841 Body Mass Index (BMI) 40.0 and over, adult: Secondary | ICD-10-CM | POA: Diagnosis not present

## 2018-06-30 MED ORDER — BUPROPION HCL ER (SR) 150 MG PO TB12: 150.0000 mg | ORAL_TABLET | Freq: Every day | ORAL | 0 refills | Status: DC

## 2018-06-30 NOTE — Progress Notes (Signed)
Office: [phone]  /  Fax: [phone] TeleHealth Visit:  Kelly Fry has verbally consented to this TeleHealth visit today. The patient is located at home, the provider is located at the [employer] [facility] office. The participants in this visit include the listed provider and patient. The visit was conducted today via [email or web address].  HPI:  Chief Complaint: OBESITY Kelly Fry is here to discuss her progress with her obesity treatment plan. She is on the  follow the Category 2 plan and is following her eating plan approximately 60 % of the time. She states she is exercising 0 minutes 0 times per week. Kelly Fry continues to do well maintaining her weight. She is getting bored with breakfast and not eating all the food on her plan for dinner.    We were unable to weigh the patient today for this TeleHealth visit. She feels as if she has maintained weight since her last visit. She has lost 23 lbs since starting treatment with [place].  Depression with emotional eating behaviors Kelly Fry is struggling with emotional eating and using food for comfort to the extent that it is negatively impacting her health. She often snacks when she is not hungry. Kelly Fry sometimes feels she is out of control and then feels guilty that she made poor food choices. She has been working on behavior modification techniques to help reduce her emotional eating and has been somewhat successful. Her mood is stable on Wellbutrin, she feels it is helping somewhat with her boredom eating. She shows no sign of suicidal or homicidal ideations. She denies insomnia and palpitations.   Depression screen [facility] 2/9 06/22/2018 04/24/2018 09/02/2017 08/28/2017 07/15/2017  Decreased Interest 0 0 3 3 0  Down, Depressed, Hopeless 0 0 3 2 0  PHQ - 2 Score 0 0 6 5 0  Altered sleeping 0 - 3 2 -  Tired, decreased energy 0 - 3 3 -  Change in appetite 0 - 3 3 -  Feeling bad or failure about yourself  0 - 3 3 -  Trouble concentrating 0 - 3 2 -  Moving  slowly or fidgety/restless 0 - 3 2 -  Suicidal thoughts 0 - 1 0 -  PHQ-9 Score 0 - 25 20 -  Difficult doing work/chores - - Very difficult - -  Some recent data might be hidden    ASSESSMENT AND PLAN:  Other depression - with emotional eating  - Plan: buPROPion (WELLBUTRIN SR) 150 MG 12 hr tablet  Class 3 severe obesity with serious comorbidity and body mass index (BMI) of 45.0 to 49.9 in adult, unspecified obesity type (HCC)  PLAN: Depression with Emotional Eating Behaviors We discussed behavior modification techniques today to help Kelly Fry deal with her emotional eating and depression. She has agreed to continue to take Wellbutrin SR 150 mg qd #30 with no refills and agreed to follow up as directed.  Obesity Kelly Fry is currently in the action stage of change. As such, her goal is to continue with weight loss efforts She has agreed to follow the Category 2 plan Kelly Fry has been instructed to work up to a goal of 150 minutes of combined cardio and strengthening exercise per week for weight loss and overall health benefits. We discussed the following Behavioral Modification Stratagies today: increasing lean protein intake, work on meal planning and easy cooking plans, no skipping meals, and ways to avoid boredom eating   Kelly Fry has agreed to follow up with our clinic in 2 weeks. She was informed of  the importance of frequent follow up visits to maximize her success with intensive lifestyle modifications for her multiple health conditions.  ALLERGIES: Allergies  Allergen Reactions  . Latuda [Lurasidone] Other (See Comments)    Shaky; worsened mood    MEDICATIONS: Current Outpatient Medications on File Prior to Visit  Medication Sig Dispense Refill  . albuterol (PROVENTIL HFA;VENTOLIN HFA) 108 (90 Base) MCG/ACT inhaler Inhale 2 puffs into the lungs every 6 (six) hours as needed for wheezing or shortness of breath. 1 Inhaler 2  . hydrochlorothiazide (MICROZIDE) 12.5 MG capsule Take 1  capsule (12.5 mg total) by mouth daily. 90 capsule 1  . metFORMIN (GLUCOPHAGE) 500 MG tablet Take 1 tablet (500 mg total) by mouth 2 (two) times daily with a meal. 60 tablet 0  . naproxen (NAPROSYN) 500 MG tablet Take 1 tablet (500 mg total) by mouth 2 (two) times daily with a meal. (if needed for pain/ swelling) 30 tablet 0   No current facility-administered medications on file prior to visit.     PAST MEDICAL HISTORY: Past Medical History:  Diagnosis Date  . Ankle instability, right   . Anxiety   . Back pain   . Bipolar disorder ([place])   . Complication of anesthesia   . Depression   . Dyspnea   . Family history of adverse reaction to anesthesia    Dad had n/v post anesthesia  . GERD (gastroesophageal reflux disease)   . Leg edema   . Medical history non-contributory   . Miscarriage   . PONV (postoperative nausea and vomiting)   . Vitamin D deficiency     PAST SURGICAL HISTORY: Past Surgical History:  Procedure Laterality Date  . ANKLE RECONSTRUCTION Right 02/21/2017   Procedure: RIGHT LATERAL ANKLE RECONSTRUCTION WITH SPLIT PERONEAL LONGUS TENDON;  Surgeon: Marybelle Killings, MD;  Location: [place];  Service: Orthopedics;  Laterality: Right;  . NO PAST SURGERIES    . WISDOM TOOTH EXTRACTION  04/12/2015    SOCIAL HISTORY: Social History   Tobacco Use  . Smoking status: Never Smoker  . Smokeless tobacco: Never Used  Substance Use Topics  . Alcohol use: No  . Drug use: No    FAMILY HISTORY: Family History  Problem Relation Age of Onset  . Kidney disease Father   . COPD Father   . Hypertension Father   . Depression Father   . Sleep apnea Father   . Obesity Father   . Kidney disease Sister   . Asthma Sister   . Early death Mother   . Stroke Mother   . Heart disease Mother   . Hypertension Mother   . Alcoholism Mother   . Asthma Brother     ROS: Review of Systems  Cardiovascular: Negative for palpitations.  Psychiatric/Behavioral: Positive for depression.  Negative for suicidal ideas. The patient does not have insomnia.        Negative for homicidal ideations     PHYSICAL EXAM: Pt in no acute distress  RECENT LABS AND TESTS: BMET    Component Value Date/Time   NA 142 03/05/2018 1127   K 4.1 03/05/2018 1127   CL 102 03/05/2018 1127   CO2 19 (L) 03/05/2018 1127   GLUCOSE 80 03/05/2018 1127   GLUCOSE 91 02/19/2017 0935   GLUCOSE 72 08/30/2013 1342   BUN 11 03/05/2018 1127   CREATININE 0.94 03/05/2018 1127   CREATININE 0.73 11/09/2013 1509   CALCIUM 9.3 03/05/2018 1127   GFRNONAA 82 03/05/2018 1127   GFRAA 94  03/05/2018 1127   Lab Results  Component Value Date   HGBA1C 5.1 03/05/2018   HGBA1C 5.0 12/02/2017   HGBA1C 5.2 09/02/2017   HGBA1C 5.0 04/18/2016   Lab Results  Component Value Date   INSULIN 19.7 03/05/2018   INSULIN 20.3 12/02/2017   INSULIN 42.7 (H) 09/02/2017   CBC    Component Value Date/Time   WBC 7.3 09/02/2017 0942   WBC 7.6 02/19/2017 0935   RBC 5.18 09/02/2017 0942   RBC 4.95 02/19/2017 0935   HGB 15.3 09/02/2017 0942   HCT 45.5 09/02/2017 0942   PLT 247 02/19/2017 0935   PLT 240 04/18/2016 0832   MCV 88 09/02/2017 0942   MCH 29.5 09/02/2017 0942   MCH 28.5 02/19/2017 0935   MCHC 33.6 09/02/2017 0942   MCHC 32.6 02/19/2017 0935   RDW 13.3 09/02/2017 0942   LYMPHSABS 1.5 09/02/2017 0942   MONOABS 0.4 06/01/2013 0956   EOSABS 0.1 09/02/2017 0942   BASOSABS 0.0 09/02/2017 0942   Iron/TIBC/Ferritin/ %Sat    Component Value Date/Time   IRON 101 06/30/2015 1007   TIBC 360 06/30/2015 1007   FERRITIN 56 06/30/2015 1007   IRONPCTSAT 28 06/30/2015 1007   Lipid Panel     Component Value Date/Time   CHOL 165 03/05/2018 1127   TRIG 74 03/05/2018 1127   HDL 43 03/05/2018 1127   CHOLHDL 3.5 06/30/2015 1007   LDLCALC 107 (H) 03/05/2018 1127   LDLDIRECT 118 (H) 06/24/2017 1633   Hepatic Function Panel     Component Value Date/Time   PROT 7.2 03/05/2018 1127   ALBUMIN 4.4 03/05/2018 1127    AST 16 03/05/2018 1127   ALT 17 03/05/2018 1127   ALKPHOS 95 03/05/2018 1127   BILITOT 0.3 03/05/2018 1127      Component Value Date/Time   TSH 2.760 09/02/2017 0942   TSH 2.880 06/24/2017 1633   TSH 2.740 06/30/2015 1007      I, Renee Ramus, am acting as Location manager for Dennard Nip, MD  I have reviewed the above documentation for accuracy and completeness, and I agree with the above. -Dennard Nip, MD

## 2018-07-03 DIAGNOSIS — L03114 Cellulitis of left upper limb: Secondary | ICD-10-CM | POA: Diagnosis not present

## 2018-07-15 ENCOUNTER — Ambulatory Visit (INDEPENDENT_AMBULATORY_CARE_PROVIDER_SITE_OTHER): Payer: Medicaid Other | Admitting: Family Medicine

## 2018-07-15 ENCOUNTER — Other Ambulatory Visit: Payer: Self-pay

## 2018-07-15 ENCOUNTER — Encounter (INDEPENDENT_AMBULATORY_CARE_PROVIDER_SITE_OTHER): Payer: Self-pay | Admitting: Family Medicine

## 2018-07-15 DIAGNOSIS — Z6841 Body Mass Index (BMI) 40.0 and over, adult: Secondary | ICD-10-CM | POA: Diagnosis not present

## 2018-07-15 DIAGNOSIS — R7303 Prediabetes: Secondary | ICD-10-CM

## 2018-07-15 MED ORDER — METFORMIN HCL 500 MG PO TABS: 500.0000 mg | ORAL_TABLET | Freq: Two times a day (BID) | ORAL | 0 refills | Status: DC

## 2018-07-15 NOTE — Progress Notes (Signed)
Office: [phone]  /  Fax: [phone] TeleHealth Visit:  Kelly Fry has verbally consented to this TeleHealth visit today. The patient is located at home, the provider is located at the [employer] [facility] office. The participants in this visit include the listed provider and patient. Kelly Fry was unable to use realtime audiovisual technology today and the telehealth visit was conducted via telephone.   HPI:   Chief Complaint: OBESITY Kelly Fry is here to discuss her progress with her obesity treatment plan. She is on the Category 2 plan and is following her eating plan approximately 100 % of the time. She states she is exercising 0 minutes 0 times per week. Kelly Fry feels she has done much better following her Category 2 plan, but doesn't feel any lighter for the last 2 weeks. She does not have a scale at home. She states her hunger is controlled. She has not done any exercise in the last 2 weeks due to knee injury.  We were unable to weigh the patient today for this TeleHealth visit. She feels as if she has gained weight since her last visit. She has lost 23 lbs since starting treatment with [place].  Insulin Resistance Kelly Fry has a diagnosis of insulin resistance based on her elevated fasting insulin level >5. Although Kelly Fry's blood glucose readings are still under good control, insulin resistance puts her at greater risk of metabolic syndrome and diabetes. She is stable on metformin and denies nausea, vomiting, or hypoglycemia. She is working on diet and exercise to decrease risk of diabetes. Last A1c was well controlled.  ASSESSMENT AND PLAN:  Class 3 severe obesity with serious comorbidity and body mass index (BMI) of 45.0 to 49.9 in adult, unspecified obesity type ([place])  Prediabetes - Plan: metFORMIN (GLUCOPHAGE) 500 MG tablet  PLAN:  Insulin Resistance Kelly Fry will continue to work on weight loss, diet, exercise, and decreasing simple carbohydrates in her diet to help  decrease the risk of diabetes. We dicussed metformin including benefits and risks. She was informed that eating too many simple carbohydrates or too many calories at one sitting increases the likelihood of GI side effects. Kelly Fry agrees to continue taking metformin 500 mg BID #60 and we will refill for 1 month. Kelly Fry agrees to follow up with our clinic in 2 weeks as directed to monitor her progress.  I spent > than 50% of the 30 minute visit on counseling as documented in the note.  Obesity Kelly Fry is currently in the action stage of change. As such, her goal is to maintain weight for now, we will recheck her RMR at next visit in the office. She has agreed to follow the Category 2 plan Kelly Fry has been instructed to work up to a goal of 150 minutes of combined cardio and strengthening exercise per week for weight loss and overall health benefits. We discussed the following Behavioral Modification Strategies today: increasing lean protein intake, no skipping meals, and work on meal planning and easy cooking plans   [name] has agreed to follow up with our clinic in 2 weeks. She was informed of the importance of frequent follow up visits to maximize her success with intensive lifestyle modifications for her multiple health conditions.  ALLERGIES: Allergies  Allergen Reactions  . Latuda [Lurasidone] Other (See Comments)    Shaky; worsened mood    MEDICATIONS: Current Outpatient Medications on File Prior to Visit  Medication Sig Dispense Refill  . albuterol (PROVENTIL HFA;VENTOLIN HFA) 108 (90 Base) MCG/ACT inhaler Inhale 2 puffs  into the lungs every 6 (six) hours as needed for wheezing or shortness of breath. 1 Inhaler 2  . buPROPion (WELLBUTRIN SR) 150 MG 12 hr tablet Take 1 tablet (150 mg total) by mouth daily. 30 tablet 0  . hydrochlorothiazide (MICROZIDE) 12.5 MG capsule Take 1 capsule (12.5 mg total) by mouth daily. 90 capsule 1  . naproxen (NAPROSYN) 500 MG tablet Take 1 tablet (500 mg  total) by mouth 2 (two) times daily with a meal. (if needed for pain/ swelling) 30 tablet 0   No current facility-administered medications on file prior to visit.     PAST MEDICAL HISTORY: Past Medical History:  Diagnosis Date  . Ankle instability, right   . Anxiety   . Back pain   . Bipolar disorder ([place])   . Complication of anesthesia   . Depression   . Dyspnea   . Family history of adverse reaction to anesthesia    Dad had n/v post anesthesia  . GERD (gastroesophageal reflux disease)   . Leg edema   . Medical history non-contributory   . Miscarriage   . PONV (postoperative nausea and vomiting)   . Vitamin D deficiency     PAST SURGICAL HISTORY: Past Surgical History:  Procedure Laterality Date  . ANKLE RECONSTRUCTION Right 02/21/2017   Procedure: RIGHT LATERAL ANKLE RECONSTRUCTION WITH SPLIT PERONEAL LONGUS TENDON;  Surgeon: Marybelle Killings, MD;  Location: [place] [facility];  Service: Orthopedics;  Laterality: Right;  . NO PAST SURGERIES    . WISDOM TOOTH EXTRACTION  04/12/2015    SOCIAL HISTORY: Social History   Tobacco Use  . Smoking status: Never Smoker  . Smokeless tobacco: Never Used  Substance Use Topics  . Alcohol use: No  . Drug use: No    FAMILY HISTORY: Family History  Problem Relation Age of Onset  . Kidney disease Father   . COPD Father   . Hypertension Father   . Depression Father   . Sleep apnea Father   . Obesity Father   . Kidney disease Sister   . Asthma Sister   . Early death Mother   . Stroke Mother   . Heart disease Mother   . Hypertension Mother   . Alcoholism Mother   . Asthma Brother     ROS: Review of Systems  Constitutional: Negative for weight loss.  Gastrointestinal: Negative for nausea and vomiting.  Endo/Heme/Allergies:       Negative hypoglycemia    PHYSICAL EXAM: Pt in no acute distress  RECENT LABS AND TESTS: BMET    Component Value Date/Time   NA 142 03/05/2018 1127   K 4.1 03/05/2018 1127   CL 102 03/05/2018 1127    CO2 19 (L) 03/05/2018 1127   GLUCOSE 80 03/05/2018 1127   GLUCOSE 91 02/19/2017 0935   GLUCOSE 72 08/30/2013 1342   BUN 11 03/05/2018 1127   CREATININE 0.94 03/05/2018 1127   CREATININE 0.73 11/09/2013 1509   CALCIUM 9.3 03/05/2018 1127   GFRNONAA 82 03/05/2018 1127   GFRAA 94 03/05/2018 1127   Lab Results  Component Value Date   HGBA1C 5.1 03/05/2018   HGBA1C 5.0 12/02/2017   HGBA1C 5.2 09/02/2017   HGBA1C 5.0 04/18/2016   Lab Results  Component Value Date   INSULIN 19.7 03/05/2018   INSULIN 20.3 12/02/2017   INSULIN 42.7 (H) 09/02/2017   CBC    Component Value Date/Time   WBC 7.3 09/02/2017 0942   WBC 7.6 02/19/2017 0935   RBC 5.18 09/02/2017 0942  RBC 4.95 02/19/2017 0935   HGB 15.3 09/02/2017 0942   HCT 45.5 09/02/2017 0942   PLT 247 02/19/2017 0935   PLT 240 04/18/2016 0832   MCV 88 09/02/2017 0942   MCH 29.5 09/02/2017 0942   MCH 28.5 02/19/2017 0935   MCHC 33.6 09/02/2017 0942   MCHC 32.6 02/19/2017 0935   RDW 13.3 09/02/2017 0942   LYMPHSABS 1.5 09/02/2017 0942   MONOABS 0.4 06/01/2013 0956   EOSABS 0.1 09/02/2017 0942   BASOSABS 0.0 09/02/2017 0942   Iron/TIBC/Ferritin/ %Sat    Component Value Date/Time   IRON 101 06/30/2015 1007   TIBC 360 06/30/2015 1007   FERRITIN 56 06/30/2015 1007   IRONPCTSAT 28 06/30/2015 1007   Lipid Panel     Component Value Date/Time   CHOL 165 03/05/2018 1127   TRIG 74 03/05/2018 1127   HDL 43 03/05/2018 1127   CHOLHDL 3.5 06/30/2015 1007   LDLCALC 107 (H) 03/05/2018 1127   LDLDIRECT 118 (H) 06/24/2017 1633   Hepatic Function Panel     Component Value Date/Time   PROT 7.2 03/05/2018 1127   ALBUMIN 4.4 03/05/2018 1127   AST 16 03/05/2018 1127   ALT 17 03/05/2018 1127   ALKPHOS 95 03/05/2018 1127   BILITOT 0.3 03/05/2018 1127      Component Value Date/Time   TSH 2.760 09/02/2017 0942   TSH 2.880 06/24/2017 1633   TSH 2.740 06/30/2015 1007      I, Trixie Dredge, am acting as Location manager for  Dennard Nip, MD I have reviewed the above documentation for accuracy and completeness, and I agree with the above. -Dennard Nip, MD

## 2018-07-28 ENCOUNTER — Encounter (INDEPENDENT_AMBULATORY_CARE_PROVIDER_SITE_OTHER): Payer: Self-pay | Admitting: Family Medicine

## 2018-07-28 ENCOUNTER — Ambulatory Visit (INDEPENDENT_AMBULATORY_CARE_PROVIDER_SITE_OTHER): Payer: Medicaid Other | Admitting: Family Medicine

## 2018-07-28 ENCOUNTER — Other Ambulatory Visit: Payer: Self-pay

## 2018-07-28 VITALS — BP 127/83 | HR 85 | Temp 98.6°F | Ht 64.0 in | Wt 276.0 lb

## 2018-07-28 DIAGNOSIS — I10 Essential (primary) hypertension: Secondary | ICD-10-CM | POA: Diagnosis not present

## 2018-07-28 DIAGNOSIS — F418 Other specified anxiety disorders: Secondary | ICD-10-CM | POA: Diagnosis not present

## 2018-07-28 DIAGNOSIS — Z6841 Body Mass Index (BMI) 40.0 and over, adult: Secondary | ICD-10-CM | POA: Diagnosis not present

## 2018-07-28 MED ORDER — BUPROPION HCL ER (SR) 200 MG PO TB12: 200.0000 mg | ORAL_TABLET | Freq: Every day | ORAL | 0 refills | Status: DC

## 2018-07-29 NOTE — Progress Notes (Signed)
Office: [phone]  /  Fax: [phone]   HPI:   Chief Complaint: OBESITY Kelly Fry is here to discuss her progress with her obesity treatment plan. She is on the  follow the Category 2 plan and is following her eating plan approximately 80 % of the time. She states she is exercising 0 minutes 0 times per week. Kelly Fry last in office visit was approximately 3 months ago. [name]  Her weight is 276 lb (125.2 kg) today and has had a weight loss of 6 pounds since her last in office visit. She has lost 17 lbs since starting treatment with [place].  Hypertension Kelly Fry is a 33 y.o. female with hypertension.  Kelly Fry denies chest pain, headache, dizziness,  or shortness of breath on exertion. She is working weight loss to help control her blood pressure with the goal of decreasing her risk of heart attack and stroke. Kelly Fry blood pressure is currently controlled on HCTZ.   Depression with emotional eating behaviors Kelly Fry is struggling with emotional eating and using food for comfort to the extent that it is negatively impacting her health. She often snacks when she is not hungry. Kelly Fry sometimes feels she is out of control and then feels guilty that she made poor food choices. She has been working on behavior modification techniques to help reduce her emotional eating and has been somewhat successful. She shows no sign of suicidal or homicidal ideations. She is on Wellbutrin but still struggling with emotional eating worse since the COVID-19 isolation.   Depression screen [facility] 2/9 06/22/2018 04/24/2018 09/02/2017 08/28/2017 07/15/2017  Decreased Interest 0 0 3 3 0  Down, Depressed, Hopeless 0 0 3 2 0  PHQ - 2 Score 0 0 6 5 0  Altered sleeping 0 - 3 2 -  Tired, decreased energy 0 - 3 3 -  Change in appetite 0 - 3 3 -  Feeling bad or failure about yourself  0 - 3 3 -  Trouble concentrating 0 - 3 2 -  Moving slowly or fidgety/restless 0 - 3 2 -  Suicidal thoughts 0 - 1 0 -  PHQ-9 Score 0 -  25 20 -  Difficult doing work/chores - - Very difficult - -  Some recent data might be hidden        ASSESSMENT AND PLAN:  Depression with anxiety - Plan: buPROPion (WELLBUTRIN SR) 200 MG 12 hr tablet  Essential hypertension  Class 3 severe obesity with serious comorbidity and body mass index (BMI) of 45.0 to 49.9 in adult, unspecified obesity type (HCC)  PLAN: Hypertension We discussed sodium restriction, working on healthy weight loss, and a regular exercise program as the means to achieve improved blood pressure control. Kelly Fry with this plan and Fry to follow up as directed. We will continue to monitor her blood pressure as well as her progress with the above lifestyle modifications. She will continue her medications as prescribed and will watch for signs of hypotension as she continues her lifestyle modifications.  Depression with Emotional Eating Behaviors We discussed behavior modification techniques today to help Kelly Fry deal with her emotional eating and depression. She has Fry to increase Wellbutrin SR 200 mg qd #30 with no refills and Fry to follow up as directed.  Obesity Kelly Fry is currently in the action stage of change. As such, her goal is to continue with weight loss efforts She has Fry to follow the Category 2 plan or pescatarian plan  Kelly Fry has been instructed to  work up to a goal of 150 minutes of combined cardio and strengthening exercise per week for weight loss and overall health benefits. We discussed the following Behavioral Modification Stratagies today: keeping healthy foods in the home, increasing lean protein intake, decreasing simple carbohydrates , decreasing sodium intake and work on meal planning and easy cooking plans  Kelly Fry has Fry to follow up with our clinic in 2-3 weeks. She was informed of the importance of frequent follow up visits to maximize her success with intensive lifestyle modifications for her multiple health  conditions.  ALLERGIES: Allergies  Allergen Reactions  . Latuda [Lurasidone] Other (See Comments)    Shaky; worsened mood    MEDICATIONS: Current Outpatient Medications on File Prior to Visit  Medication Sig Dispense Refill  . albuterol (PROVENTIL HFA;VENTOLIN HFA) 108 (90 Base) MCG/ACT inhaler Inhale 2 puffs into the lungs every 6 (six) hours as needed for wheezing or shortness of breath. 1 Inhaler 2  . hydrochlorothiazide (MICROZIDE) 12.5 MG capsule Take 1 capsule (12.5 mg total) by mouth daily. 90 capsule 1  . metFORMIN (GLUCOPHAGE) 500 MG tablet Take 1 tablet (500 mg total) by mouth 2 (two) times daily with a meal. 60 tablet 0  . naproxen (NAPROSYN) 500 MG tablet Take 1 tablet (500 mg total) by mouth 2 (two) times daily with a meal. (if needed for pain/ swelling) 30 tablet 0   No current facility-administered medications on file prior to visit.     PAST MEDICAL HISTORY: Past Medical History:  Diagnosis Date  . Ankle instability, right   . Anxiety   . Back pain   . Bipolar disorder ([place])   . Complication of anesthesia   . Depression   . Dyspnea   . Family history of adverse reaction to anesthesia    Dad had n/v post anesthesia  . GERD (gastroesophageal reflux disease)   . Leg edema   . Medical history non-contributory   . Miscarriage   . PONV (postoperative nausea and vomiting)   . Vitamin D deficiency     PAST SURGICAL HISTORY: Past Surgical History:  Procedure Laterality Date  . ANKLE RECONSTRUCTION Right 02/21/2017   Procedure: RIGHT LATERAL ANKLE RECONSTRUCTION WITH SPLIT PERONEAL LONGUS TENDON;  Surgeon: Marybelle Killings, MD;  Location: [place];  Service: Orthopedics;  Laterality: Right;  . NO PAST SURGERIES    . WISDOM TOOTH EXTRACTION  04/12/2015    SOCIAL HISTORY: Social History   Tobacco Use  . Smoking status: Never Smoker  . Smokeless tobacco: Never Used  Substance Use Topics  . Alcohol use: No  . Drug use: No    FAMILY HISTORY: Family History   Problem Relation Age of Onset  . Kidney disease Father   . COPD Father   . Hypertension Father   . Depression Father   . Sleep apnea Father   . Obesity Father   . Kidney disease Sister   . Asthma Sister   . Early death Mother   . Stroke Mother   . Heart disease Mother   . Hypertension Mother   . Alcoholism Mother   . Asthma Brother     ROS: Review of Systems  Constitutional: Negative for weight loss.  Respiratory: Negative for shortness of breath.   Cardiovascular: Negative for chest pain.  Neurological: Negative for dizziness and headaches.  Psychiatric/Behavioral: Positive for depression. Negative for suicidal ideas.    PHYSICAL EXAM: Blood pressure 127/83, pulse 85, temperature 98.6 F (37 C), temperature source Oral, height 5\' 4"  (  1.626 m), weight 276 lb (125.2 kg), SpO2 99 %. Body mass index is 47.38 kg/m. Physical Exam Vitals signs reviewed.  Constitutional:      Appearance: Normal appearance. She is obese.  HENT:     Head: Normocephalic.  Neck:     Musculoskeletal: Normal range of motion.  Cardiovascular:     Rate and Rhythm: Normal rate.  Pulmonary:     Effort: Pulmonary effort is normal.  Musculoskeletal: Normal range of motion.  Skin:    General: Skin is warm and dry.  Neurological:     Mental Status: She is alert and oriented to person, place, and time.  Psychiatric:        Mood and Affect: Mood normal.        Behavior: Behavior normal.     RECENT LABS AND TESTS: BMET    Component Value Date/Time   NA 142 03/05/2018 1127   K 4.1 03/05/2018 1127   CL 102 03/05/2018 1127   CO2 19 (L) 03/05/2018 1127   GLUCOSE 80 03/05/2018 1127   GLUCOSE 91 02/19/2017 0935   GLUCOSE 72 08/30/2013 1342   BUN 11 03/05/2018 1127   CREATININE 0.94 03/05/2018 1127   CREATININE 0.73 11/09/2013 1509   CALCIUM 9.3 03/05/2018 1127   GFRNONAA 82 03/05/2018 1127   GFRAA 94 03/05/2018 1127   Lab Results  Component Value Date   HGBA1C 5.1 03/05/2018   HGBA1C 5.0  12/02/2017   HGBA1C 5.2 09/02/2017   HGBA1C 5.0 04/18/2016   Lab Results  Component Value Date   INSULIN 19.7 03/05/2018   INSULIN 20.3 12/02/2017   INSULIN 42.7 (H) 09/02/2017   CBC    Component Value Date/Time   WBC 7.3 09/02/2017 0942   WBC 7.6 02/19/2017 0935   RBC 5.18 09/02/2017 0942   RBC 4.95 02/19/2017 0935   HGB 15.3 09/02/2017 0942   HCT 45.5 09/02/2017 0942   PLT 247 02/19/2017 0935   PLT 240 04/18/2016 0832   MCV 88 09/02/2017 0942   MCH 29.5 09/02/2017 0942   MCH 28.5 02/19/2017 0935   MCHC 33.6 09/02/2017 0942   MCHC 32.6 02/19/2017 0935   RDW 13.3 09/02/2017 0942   LYMPHSABS 1.5 09/02/2017 0942   MONOABS 0.4 06/01/2013 0956   EOSABS 0.1 09/02/2017 0942   BASOSABS 0.0 09/02/2017 0942   Iron/TIBC/Ferritin/ %Sat    Component Value Date/Time   IRON 101 06/30/2015 1007   TIBC 360 06/30/2015 1007   FERRITIN 56 06/30/2015 1007   IRONPCTSAT 28 06/30/2015 1007   Lipid Panel     Component Value Date/Time   CHOL 165 03/05/2018 1127   TRIG 74 03/05/2018 1127   HDL 43 03/05/2018 1127   CHOLHDL 3.5 06/30/2015 1007   LDLCALC 107 (H) 03/05/2018 1127   LDLDIRECT 118 (H) 06/24/2017 1633   Hepatic Function Panel     Component Value Date/Time   PROT 7.2 03/05/2018 1127   ALBUMIN 4.4 03/05/2018 1127   AST 16 03/05/2018 1127   ALT 17 03/05/2018 1127   ALKPHOS 95 03/05/2018 1127   BILITOT 0.3 03/05/2018 1127      Component Value Date/Time   TSH 2.760 09/02/2017 0942   TSH 2.880 06/24/2017 1633   TSH 2.740 06/30/2015 1007      OBESITY BEHAVIORAL INTERVENTION VISIT  Today's visit was # 18   Starting weight: 293 lb Starting date: 09/02/17 Today's weight : Weight: 276 lb (125.2 kg)  Today's date: 07/28/18 Total lbs lost to date: 17 lbs At least  15 minutes were spent on discussing the following behavioral intervention visit.   ASK: We discussed the diagnosis of obesity with Kelly Fry today and Kelly Fry Fry to give [place] permission to discuss  obesity behavioral modification therapy today.  ASSESS: Kelly Fry has the diagnosis of obesity and her BMI today is 47.35 Kelly Fry is in the action stage of change   ADVISE: Kelly Fry was educated on the multiple health risks of obesity as well as the benefit of weight loss to improve her health. She was advised of the need for long term treatment and the importance of lifestyle modifications to improve her current health and to decrease her risk of future health problems.  AGREE: Multiple dietary modification options and treatment options were discussed and  Kelly Fry to follow the recommendations documented in the above note.  ARRANGE: Kelly Fry was educated on the importance of frequent visits to treat obesity as outlined per CMS and USPSTF guidelines and Fry to schedule her next follow up appointment today.  I, Renee Ramus, am acting as transcriptionist for Dennard Nip, MD   I have reviewed the above documentation for accuracy and completeness, and I agree with the above. -Dennard Nip, MD

## 2018-08-04 ENCOUNTER — Other Ambulatory Visit: Payer: Self-pay | Admitting: *Deleted

## 2018-08-04 DIAGNOSIS — M25473 Effusion, unspecified ankle: Secondary | ICD-10-CM

## 2018-08-04 MED ORDER — HYDROCHLOROTHIAZIDE 12.5 MG PO CAPS: 12.5000 mg | ORAL_CAPSULE | Freq: Every day | ORAL | 1 refills | Status: DC

## 2018-08-18 ENCOUNTER — Ambulatory Visit (INDEPENDENT_AMBULATORY_CARE_PROVIDER_SITE_OTHER): Payer: Medicaid Other | Admitting: Family Medicine

## 2018-08-18 ENCOUNTER — Other Ambulatory Visit: Payer: Self-pay

## 2018-08-18 ENCOUNTER — Encounter (INDEPENDENT_AMBULATORY_CARE_PROVIDER_SITE_OTHER): Payer: Self-pay | Admitting: Family Medicine

## 2018-08-18 VITALS — BP 130/80 | HR 92 | Temp 98.4°F | Ht 64.0 in | Wt 275.0 lb

## 2018-08-18 DIAGNOSIS — Z6841 Body Mass Index (BMI) 40.0 and over, adult: Secondary | ICD-10-CM

## 2018-08-18 DIAGNOSIS — S335XXA Sprain of ligaments of lumbar spine, initial encounter: Secondary | ICD-10-CM

## 2018-08-18 DIAGNOSIS — F418 Other specified anxiety disorders: Secondary | ICD-10-CM | POA: Diagnosis not present

## 2018-08-18 MED ORDER — BUPROPION HCL ER (SR) 200 MG PO TB12: 200.0000 mg | ORAL_TABLET | Freq: Every day | ORAL | 0 refills | Status: DC

## 2018-08-18 MED ORDER — CYCLOBENZAPRINE HCL 10 MG PO TABS: 10.0000 mg | ORAL_TABLET | Freq: Two times a day (BID) | ORAL | 0 refills | Status: DC

## 2018-08-19 NOTE — Progress Notes (Signed)
Office: [phone]  /  Fax: [phone]   HPI:   Chief Complaint: OBESITY Kelly Fry is here to discuss her progress with her obesity treatment plan. She is on the  follow the Category 2 plan and is following her eating plan approximately 40 % of the time. She states she is exercising 0 minutes 0 times per week. Kelly Fry is currently struggling to stay on track over the last few weeks but was still able to lose 1 lb during that time. She is getting bored with her plan and would like to look at other options. Kelly Fry  Her weight is 275 lb (124.7 kg) today and has had a weight loss of 1 pounds over a period of 2 weeks since her last visit. She has lost 18 lbs since starting treatment with [place].  Chronic Low Back Strain [name] has a history of low back pain with lumbar lordosis and a heavy chest. The pain is the worst standing and improves with sitting. No fever, no history of cancer, no loss of bowel or bladder.   Depression with emotional eating behaviors [name] is struggling with emotional eating and using food for comfort to the extent that it is negatively impacting her health. She often snacks when she is not hungry. [name] sometimes feels she is out of control and then feels guilty that she made poor food choices. She has been working on behavior modification techniques to help reduce her emotional eating and has been somewhat successful. Her mood is stable on Wellbutrin. Her blood pressure is controlled and she denies insomnia.  She shows no sign of suicidal or homicidal ideations.  Depression screen [facility] 2/9 06/22/2018 04/24/2018 09/02/2017 08/28/2017 07/15/2017  Decreased Interest 0 0 3 3 0  Down, Depressed, Hopeless 0 0 3 2 0  PHQ - 2 Score 0 0 6 5 0  Altered sleeping 0 - 3 2 -  Tired, decreased energy 0 - 3 3 -  Change in appetite 0 - 3 3 -  Feeling bad or failure about yourself  0 - 3 3 -  Trouble concentrating 0 - 3 2 -  Moving slowly or fidgety/restless 0 - 3 2 -  Suicidal thoughts 0 - 1 0 -   PHQ-9 Score 0 - 25 20 -  Difficult doing work/chores - - Very difficult - -  Some recent data might be hidden    ASSESSMENT AND PLAN:  Sprain of low back, initial encounter - Plan: cyclobenzaprine (FLEXERIL) 10 MG tablet  Depression with anxiety - Plan: buPROPion (WELLBUTRIN SR) 200 MG 12 hr tablet  Class 3 severe obesity with serious comorbidity and body mass index (BMI) of 45.0 to 49.9 in adult, unspecified obesity type (HCC)  PLAN:  Chronic Low Back Strain Kelly Fry agrees to start flexeril 10 mg BID #30 with no refills and continue OTC NSAIDS PRN with food and will follow up in 2-3 weeks to discuss back exercises. Agrees to follow up with our clinic as directed.   Depression with Emotional Eating Behaviors We discussed behavior modification techniques today to help Kelly Fry deal with her emotional eating and depression. She has agreed to continue to take Wellbutrin SR 150 mg qd #30 with no refills and agreed to follow up as directed.  Obesity Kelly Fry is currently in the action stage of change. As such, her goal is to continue with weight loss efforts She has agreed to change plan and follow the pescatarian plan.  Kelly Fry has been instructed to work up to a goal  of 150 minutes of combined cardio and strengthening exercise per week for weight loss and overall health benefits. We discussed the following Behavioral Modification Stratagies today: increasing lean protein intake, increasing vegetables, work on meal planning and easy cooking plans and emotional eating strategies  Kelly Fry has agreed to follow up with our clinic in 2-3 weeks. She was informed of the importance of frequent follow up visits to maximize her success with intensive lifestyle modifications for her multiple health conditions.   ALLERGIES: Allergies  Allergen Reactions  . Latuda [Lurasidone] Other (See Comments)    Shaky; worsened mood    MEDICATIONS: Current Outpatient Medications on File Prior to Visit   Medication Sig Dispense Refill  . albuterol (PROVENTIL HFA;VENTOLIN HFA) 108 (90 Base) MCG/ACT inhaler Inhale 2 puffs into the lungs every 6 (six) hours as needed for wheezing or shortness of breath. 1 Inhaler 2  . hydrochlorothiazide (MICROZIDE) 12.5 MG capsule Take 1 capsule (12.5 mg total) by mouth daily. 90 capsule 1  . metFORMIN (GLUCOPHAGE) 500 MG tablet Take 1 tablet (500 mg total) by mouth 2 (two) times daily with a meal. 60 tablet 0  . naproxen (NAPROSYN) 500 MG tablet Take 1 tablet (500 mg total) by mouth 2 (two) times daily with a meal. (if needed for pain/ swelling) 30 tablet 0   No current facility-administered medications on file prior to visit.     PAST MEDICAL HISTORY: Past Medical History:  Diagnosis Date  . Ankle instability, right   . Anxiety   . Back pain   . Bipolar disorder ([place])   . Complication of anesthesia   . Depression   . Dyspnea   . Family history of adverse reaction to anesthesia    Dad had n/v post anesthesia  . GERD (gastroesophageal reflux disease)   . Leg edema   . Medical history non-contributory   . Miscarriage   . PONV (postoperative nausea and vomiting)   . Vitamin D deficiency     PAST SURGICAL HISTORY: Past Surgical History:  Procedure Laterality Date  . ANKLE RECONSTRUCTION Right 02/21/2017   Procedure: RIGHT LATERAL ANKLE RECONSTRUCTION WITH SPLIT PERONEAL LONGUS TENDON;  Surgeon: Marybelle Killings, MD;  Location: [place];  Service: Orthopedics;  Laterality: Right;  . NO PAST SURGERIES    . WISDOM TOOTH EXTRACTION  04/12/2015    SOCIAL HISTORY: Social History   Tobacco Use  . Smoking status: Never Smoker  . Smokeless tobacco: Never Used  Substance Use Topics  . Alcohol use: No  . Drug use: No    FAMILY HISTORY: Family History  Problem Relation Age of Onset  . Kidney disease Father   . COPD Father   . Hypertension Father   . Depression Father   . Sleep apnea Father   . Obesity Father   . Kidney disease Sister   . Asthma  Sister   . Early death Mother   . Stroke Mother   . Heart disease Mother   . Hypertension Mother   . Alcoholism Mother   . Asthma Brother     ROS: Review of Systems  Constitutional: Positive for weight loss. Negative for fever.  Musculoskeletal: Positive for back pain.  Psychiatric/Behavioral: Positive for depression. Negative for suicidal ideas.    PHYSICAL EXAM: Blood pressure 130/80, pulse 92, temperature 98.4 F (36.9 C), temperature source Oral, height 5\' 4"  (1.626 m), weight 275 lb (124.7 kg), SpO2 98 %. Body mass index is 47.2 kg/m. Physical Exam Vitals signs reviewed.  Constitutional:  Appearance: Normal appearance. She is obese.  HENT:     Head: Normocephalic.  Neck:     Musculoskeletal: Normal range of motion.  Cardiovascular:     Rate and Rhythm: Normal rate.  Pulmonary:     Effort: Pulmonary effort is normal.  Musculoskeletal: Normal range of motion.  Skin:    General: Skin is warm and dry.  Neurological:     Mental Status: She is alert and oriented to person, place, and time.  Psychiatric:        Mood and Affect: Mood normal.        Behavior: Behavior normal.     RECENT LABS AND TESTS: BMET    Component Value Date/Time   NA 142 03/05/2018 1127   K 4.1 03/05/2018 1127   CL 102 03/05/2018 1127   CO2 19 (L) 03/05/2018 1127   GLUCOSE 80 03/05/2018 1127   GLUCOSE 91 02/19/2017 0935   GLUCOSE 72 08/30/2013 1342   BUN 11 03/05/2018 1127   CREATININE 0.94 03/05/2018 1127   CREATININE 0.73 11/09/2013 1509   CALCIUM 9.3 03/05/2018 1127   GFRNONAA 82 03/05/2018 1127   GFRAA 94 03/05/2018 1127   Lab Results  Component Value Date   HGBA1C 5.1 03/05/2018   HGBA1C 5.0 12/02/2017   HGBA1C 5.2 09/02/2017   HGBA1C 5.0 04/18/2016   Lab Results  Component Value Date   INSULIN 19.7 03/05/2018   INSULIN 20.3 12/02/2017   INSULIN 42.7 (H) 09/02/2017   CBC    Component Value Date/Time   WBC 7.3 09/02/2017 0942   WBC 7.6 02/19/2017 0935   RBC  5.18 09/02/2017 0942   RBC 4.95 02/19/2017 0935   HGB 15.3 09/02/2017 0942   HCT 45.5 09/02/2017 0942   PLT 247 02/19/2017 0935   PLT 240 04/18/2016 0832   MCV 88 09/02/2017 0942   MCH 29.5 09/02/2017 0942   MCH 28.5 02/19/2017 0935   MCHC 33.6 09/02/2017 0942   MCHC 32.6 02/19/2017 0935   RDW 13.3 09/02/2017 0942   LYMPHSABS 1.5 09/02/2017 0942   MONOABS 0.4 06/01/2013 0956   EOSABS 0.1 09/02/2017 0942   BASOSABS 0.0 09/02/2017 0942   Iron/TIBC/Ferritin/ %Sat    Component Value Date/Time   IRON 101 06/30/2015 1007   TIBC 360 06/30/2015 1007   FERRITIN 56 06/30/2015 1007   IRONPCTSAT 28 06/30/2015 1007   Lipid Panel     Component Value Date/Time   CHOL 165 03/05/2018 1127   TRIG 74 03/05/2018 1127   HDL 43 03/05/2018 1127   CHOLHDL 3.5 06/30/2015 1007   LDLCALC 107 (H) 03/05/2018 1127   LDLDIRECT 118 (H) 06/24/2017 1633   Hepatic Function Panel     Component Value Date/Time   PROT 7.2 03/05/2018 1127   ALBUMIN 4.4 03/05/2018 1127   AST 16 03/05/2018 1127   ALT 17 03/05/2018 1127   ALKPHOS 95 03/05/2018 1127   BILITOT 0.3 03/05/2018 1127      Component Value Date/Time   TSH 2.760 09/02/2017 0942   TSH 2.880 06/24/2017 1633   TSH 2.740 06/30/2015 1007      OBESITY BEHAVIORAL INTERVENTION VISIT  Today's visit was # 19   Starting weight: 293 lb Starting date: 09/02/17 Today's weight : Weight: 275 lb (124.7 kg)  Today's date: 08/18/18 Total lbs lost to date: 18 At least 15 minutes were spent on discussing the following behavioral intervention visit.   ASK: We discussed the diagnosis of obesity with [name] today and Aldona Lento agreed to  give [place] permission to discuss obesity behavioral modification therapy today.  ASSESS: Nazyia has the diagnosis of obesity and her BMI today is 47.18 Saesha is in the action stage of change   ADVISE: Graylee was educated on the multiple health risks of obesity as well as the benefit of weight loss to improve her  health. She was advised of the need for long term treatment and the importance of lifestyle modifications to improve her current health and to decrease her risk of future health problems.  AGREE: Multiple dietary modification options and treatment options were discussed and  Kambryn agreed to follow the recommendations documented in the above note.  ARRANGE: Kabria was educated on the importance of frequent visits to treat obesity as outlined per CMS and USPSTF guidelines and agreed to schedule her next follow up appointment today.  I, Renee Ramus, am acting as transcriptionist for Ilene Qua, MD   I have reviewed the above documentation for accuracy and completeness, and I agree with the above. -Dennard Nip, MD

## 2018-09-08 ENCOUNTER — Encounter (INDEPENDENT_AMBULATORY_CARE_PROVIDER_SITE_OTHER): Payer: Self-pay | Admitting: Family Medicine

## 2018-09-08 ENCOUNTER — Ambulatory Visit (INDEPENDENT_AMBULATORY_CARE_PROVIDER_SITE_OTHER): Payer: Medicaid Other | Admitting: Family Medicine

## 2018-09-08 ENCOUNTER — Other Ambulatory Visit: Payer: Self-pay

## 2018-09-08 ENCOUNTER — Telehealth: Payer: Self-pay | Admitting: Family Medicine

## 2018-09-08 VITALS — BP 112/74 | HR 98 | Temp 98.4°F | Ht 64.0 in | Wt 272.0 lb

## 2018-09-08 DIAGNOSIS — Z6841 Body Mass Index (BMI) 40.0 and over, adult: Secondary | ICD-10-CM

## 2018-09-08 DIAGNOSIS — E8881 Metabolic syndrome: Secondary | ICD-10-CM

## 2018-09-08 DIAGNOSIS — F3289 Other specified depressive episodes: Secondary | ICD-10-CM

## 2018-09-08 DIAGNOSIS — E559 Vitamin D deficiency, unspecified: Secondary | ICD-10-CM

## 2018-09-08 DIAGNOSIS — R7303 Prediabetes: Secondary | ICD-10-CM

## 2018-09-08 DIAGNOSIS — R5383 Other fatigue: Secondary | ICD-10-CM

## 2018-09-08 MED ORDER — BUPROPION HCL ER (SR) 150 MG PO TB12: 150.0000 mg | ORAL_TABLET | Freq: Two times a day (BID) | ORAL | 0 refills | Status: DC

## 2018-09-08 MED ORDER — METFORMIN HCL 500 MG PO TABS: 500.0000 mg | ORAL_TABLET | Freq: Two times a day (BID) | ORAL | 0 refills | Status: DC

## 2018-09-08 NOTE — Telephone Encounter (Signed)
appt made for 09/17/18

## 2018-09-08 NOTE — Progress Notes (Signed)
Office: [phone]  /  Fax: [phone]   HPI:   Chief Complaint: OBESITY Kelly Fry is here to discuss her progress with her obesity treatment plan. She is on the Pescatarian eating plan and is following her eating plan approximately 75% of the time. She states she is exercising 0 minutes 0 times per week. Kelly Fry continues to do well with weight loss on her Pescatarian plan. She states hunger is controlled, but she struggles with nighttime cravings. Her weight is 272 lb (123.4 kg) today and has had a weight loss of 3 pounds over a period of 3 weeks since her last visit. She has lost 21 lbs since starting treatment with [place].   Depression with emotional eating behaviors Kelly Fry is struggling with emotional eating and using food for comfort to the extent that it is negatively impacting her health. She often snacks when she is not hungry. Kelly Fry sometimes feels she is out of control and then feels guilty that she made poor food choices. She has been working on behavior modification techniques to help reduce her emotional eating and has been somewhat successful. Kelly Fry feels her Wellbutrin is helping with emotional eating, but still struggles between dinner and bedtime. She shows no sign of suicidal or homicidal ideations.  Depression screen Kelly Fry 2/9 06/22/2018 04/24/2018 09/02/2017 08/28/2017 07/15/2017  Decreased Interest 0 0 3 3 0  Down, Depressed, Hopeless 0 0 3 2 0  PHQ - 2 Score 0 0 6 5 0  Altered sleeping 0 - 3 2 -  Tired, decreased energy 0 - 3 3 -  Change in appetite 0 - 3 3 -  Feeling bad or failure about yourself  0 - 3 3 -  Trouble concentrating 0 - 3 2 -  Moving slowly or fidgety/restless 0 - 3 2 -  Suicidal thoughts 0 - 1 0 -  PHQ-9 Score 0 - 25 20 -  Difficult doing work/chores - - Very difficult - -  Some recent data might be hidden   Insulin Resistance Kelly Fry has a diagnosis of insulin resistance based on her elevated fasting insulin level >5. Although Kelly Fry's blood glucose  readings are still under good control, insulin resistance puts her at greater risk of metabolic syndrome and diabetes. She is stable on metformin currently and continues to work on diet and exercise to decrease risk of diabetes. No nausea or vomiting.  Vitamin D deficiency Kelly Fry has a diagnosis of Vitamin D deficiency, which is almost at goal. She denies nausea, vomiting or muscle weakness. She is due for labs.  Fatigue Kelly Fry is still noting fatigue, although she states it is better. She does have a family history of hypothyroidism.  ASSESSMENT AND PLAN:  Insulin resistance - Plan: Insulin, random, Hemoglobin A1c, Lipid Panel With LDL/HDL Ratio, Comprehensive metabolic panel  Other depression - Plan: buPROPion (WELLBUTRIN SR) 150 MG 12 hr tablet  Vitamin D deficiency - Plan: VITAMIN D 25 Hydroxy (Vit-D Deficiency, Fractures)  Other fatigue - Plan: T4, free, T3, TSH, Lipid Panel With LDL/HDL Ratio  Prediabetes - Plan: metFORMIN (GLUCOPHAGE) 500 MG tablet  Class 3 severe obesity with serious comorbidity and body mass index (BMI) of 45.0 to 49.9 in adult, unspecified obesity type (HCC)  PLAN:  Depression with Emotional Eating Behaviors We discussed behavior modification techniques today to help Kelly Fry deal with her emotional eating and depression. Kelly Fry will increase Wellbutrin SR to 150 mg BID and will have labs checked. She agrees to follow-up with our clinic in 2-3 weeks.  Insulin  Resistance Kelly Fry will continue to work on weight loss, exercise, and decreasing simple carbohydrates in her diet to help decrease the risk of diabetes. We discussed metformin including benefits and risks. She was informed that eating too many simple carbohydrates or too many calories at one sitting increases the likelihood of GI side effects. Kelly Fry was given a refill on her metformin 500 mg #60 with 0 refills and agrees to follow-up with our clinic in 2-3 weeks. She will have labs checked.  Vitamin D  Deficiency Kelly Fry was informed that low Vitamin D levels contributes to fatigue and are associated with obesity, breast, and colon cancer. She will have routine testing of Vitamin D and agrees to follow-up with our clinic in 2-3 weeks.  Fatigue Kelly Fry was informed that her fatigue may be related to obesity, depression or many other causes. Labs will be ordered, and in the meanwhile Kelly Fry has agreed to work on diet, exercise and weight loss to help with fatigue. Proper sleep hygiene was discussed including the need for 7-8 hours of quality sleep each night. She will have labs checked and follow-up with [place] as directed to monitor her progress.  Obesity Kelly Fry is currently in the action stage of change. As such, her goal is to continue with weight loss efforts. She has agreed to follow the Category 2 plan or the Pescatarian plan. Kelly Fry has been instructed to work up to a goal of 150 minutes of combined cardio and strengthening exercise per week for weight loss and overall health benefits. We discussed the following Behavioral Modification Strategies today: increasing lean protein intake, decreasing simple carbohydrates, and no skipping meals.  Kelly Fry has agreed to follow-up with our clinic in 2-3 weeks. She was informed of the importance of frequent follow-up visits to maximize her success with intensive lifestyle modifications for her multiple health conditions.  ALLERGIES: Allergies  Allergen Reactions  . Latuda [Lurasidone] Other (See Comments)    Shaky; worsened mood    MEDICATIONS: Current Outpatient Medications on File Prior to Visit  Medication Sig Dispense Refill  . albuterol (PROVENTIL HFA;VENTOLIN HFA) 108 (90 Base) MCG/ACT inhaler Inhale 2 puffs into the lungs every 6 (six) hours as needed for wheezing or shortness of breath. 1 Inhaler 2  . cyclobenzaprine (FLEXERIL) 10 MG tablet Take 1 tablet (10 mg total) by mouth 2 (two) times a day. 30 tablet 0  . hydrochlorothiazide  (MICROZIDE) 12.5 MG capsule Take 1 capsule (12.5 mg total) by mouth daily. 90 capsule 1  . metFORMIN (GLUCOPHAGE) 500 MG tablet Take 1 tablet (500 mg total) by mouth 2 (two) times daily with a meal. 60 tablet 0  . naproxen (NAPROSYN) 500 MG tablet Take 1 tablet (500 mg total) by mouth 2 (two) times daily with a meal. (if needed for pain/ swelling) 30 tablet 0   No current facility-administered medications on file prior to visit.     PAST MEDICAL HISTORY: Past Medical History:  Diagnosis Date  . Ankle instability, right   . Anxiety   . Back pain   . Bipolar disorder ([place])   . Complication of anesthesia   . Depression   . Dyspnea   . Family history of adverse reaction to anesthesia    Dad had n/v post anesthesia  . GERD (gastroesophageal reflux disease)   . Leg edema   . Medical history non-contributory   . Miscarriage   . PONV (postoperative nausea and vomiting)   . Vitamin D deficiency     PAST SURGICAL HISTORY: Past  Surgical History:  Procedure Laterality Date  . ANKLE RECONSTRUCTION Right 02/21/2017   Procedure: RIGHT LATERAL ANKLE RECONSTRUCTION WITH SPLIT PERONEAL LONGUS TENDON;  Surgeon: Marybelle Killings, MD;  Location: [facility];  Service: Orthopedics;  Laterality: Right;  . NO PAST SURGERIES    . WISDOM TOOTH EXTRACTION  04/12/2015    SOCIAL HISTORY: Social History   Tobacco Use  . Smoking status: Never Smoker  . Smokeless tobacco: Never Used  Substance Use Topics  . Alcohol use: No  . Drug use: No    FAMILY HISTORY: Family History  Problem Relation Age of Onset  . Kidney disease Father   . COPD Father   . Hypertension Father   . Depression Father   . Sleep apnea Father   . Obesity Father   . Kidney disease Sister   . Asthma Sister   . Early death Mother   . Stroke Mother   . Heart disease Mother   . Hypertension Mother   . Alcoholism Mother   . Asthma Brother    ROS: Review of Systems  Constitutional: Positive for malaise/fatigue.  Gastrointestinal:  Negative for nausea and vomiting.  Musculoskeletal:       Negative for muscle weakness.  Psychiatric/Behavioral: Positive for depression (emotional eating). Negative for suicidal ideas.       Negative for homicidal ideas.   PHYSICAL EXAM: Blood pressure 112/74, pulse 98, temperature 98.4 F (36.9 C), temperature source Oral, height 5\' 4"  (1.626 m), weight 272 lb (123.4 kg), SpO2 98 %. Body mass index is 46.69 kg/m. Physical Exam Vitals signs reviewed.  Constitutional:      Appearance: Normal appearance. She is obese.  Cardiovascular:     Rate and Rhythm: Normal rate.     Pulses: Normal pulses.  Pulmonary:     Effort: Pulmonary effort is normal.     Breath sounds: Normal breath sounds.  Musculoskeletal: Normal range of motion.  Skin:    General: Skin is warm and dry.  Neurological:     Mental Status: She is alert and oriented to person, place, and time.  Psychiatric:        Behavior: Behavior normal.   RECENT LABS AND TESTS: BMET    Component Value Date/Time   NA 142 03/05/2018 1127   K 4.1 03/05/2018 1127   CL 102 03/05/2018 1127   CO2 19 (L) 03/05/2018 1127   GLUCOSE 80 03/05/2018 1127   GLUCOSE 91 02/19/2017 0935   GLUCOSE 72 08/30/2013 1342   BUN 11 03/05/2018 1127   CREATININE 0.94 03/05/2018 1127   CREATININE 0.73 11/09/2013 1509   CALCIUM 9.3 03/05/2018 1127   GFRNONAA 82 03/05/2018 1127   GFRAA 94 03/05/2018 1127   Lab Results  Component Value Date   HGBA1C 5.1 03/05/2018   HGBA1C 5.0 12/02/2017   HGBA1C 5.2 09/02/2017   HGBA1C 5.0 04/18/2016   Lab Results  Component Value Date   INSULIN 19.7 03/05/2018   INSULIN 20.3 12/02/2017   INSULIN 42.7 (H) 09/02/2017   CBC    Component Value Date/Time   WBC 7.3 09/02/2017 0942   WBC 7.6 02/19/2017 0935   RBC 5.18 09/02/2017 0942   RBC 4.95 02/19/2017 0935   HGB 15.3 09/02/2017 0942   HCT 45.5 09/02/2017 0942   PLT 247 02/19/2017 0935   PLT 240 04/18/2016 0832   MCV 88 09/02/2017 0942   MCH 29.5  09/02/2017 0942   MCH 28.5 02/19/2017 0935   MCHC 33.6 09/02/2017 0942   MCHC 32.6  02/19/2017 0935   RDW 13.3 09/02/2017 0942   LYMPHSABS 1.5 09/02/2017 0942   MONOABS 0.4 06/01/2013 0956   EOSABS 0.1 09/02/2017 0942   BASOSABS 0.0 09/02/2017 0942   Iron/TIBC/Ferritin/ %Sat    Component Value Date/Time   IRON 101 06/30/2015 1007   TIBC 360 06/30/2015 1007   FERRITIN 56 06/30/2015 1007   IRONPCTSAT 28 06/30/2015 1007   Lipid Panel     Component Value Date/Time   CHOL 165 03/05/2018 1127   TRIG 74 03/05/2018 1127   HDL 43 03/05/2018 1127   CHOLHDL 3.5 06/30/2015 1007   LDLCALC 107 (H) 03/05/2018 1127   LDLDIRECT 118 (H) 06/24/2017 1633   Hepatic Function Panel     Component Value Date/Time   PROT 7.2 03/05/2018 1127   ALBUMIN 4.4 03/05/2018 1127   AST 16 03/05/2018 1127   ALT 17 03/05/2018 1127   ALKPHOS 95 03/05/2018 1127   BILITOT 0.3 03/05/2018 1127      Component Value Date/Time   TSH 2.760 09/02/2017 0942   TSH 2.880 06/24/2017 1633   TSH 2.740 06/30/2015 1007   Results for Kelly Fry, Kelly Fry (MRN [id number]) as of 09/08/2018 12:02  Ref. Range 03/05/2018 11:27  Vitamin D, 25-Hydroxy Latest Ref Range: 30.0 - 100.0 ng/mL 48.4   OBESITY BEHAVIORAL INTERVENTION VISIT  Today's visit was #20  Starting weight: 293 lbs Starting date: 09/02/2017 Today's weight: 272 lbs  Today's date: 09/08/2018 Total lbs lost to date: 21   09/08/2018  Height 5\' 4"  (1.626 m)  Weight 272 lb (123.4 kg)  BMI (Calculated) 46.67  BLOOD PRESSURE - SYSTOLIC 450  BLOOD PRESSURE - DIASTOLIC 74   Body Fat % 38.8 %  Total Body Water (lbs) 92.8 lbs   ASK: We discussed the diagnosis of obesity with Kelly Fry today and Kelly Fry agreed to give [place] permission to discuss obesity behavioral modification therapy today.  ASSESS: Kelly Fry has the diagnosis of obesity and her BMI today is 46.8. Kelly Fry is in the action stage of change.   ADVISE: Kelly Fry was educated on the multiple health  risks of obesity as well as the benefit of weight loss to improve her health. She was advised of the need for long term treatment and the importance of lifestyle modifications to improve her current health and to decrease her risk of future health problems.  AGREE: Multiple dietary modification options and treatment options were discussed and  Kelly Fry agreed to follow the recommendations documented in the above note.  ARRANGE: Kelly Fry was educated on the importance of frequent visits to treat obesity as outlined per CMS and USPSTF guidelines and agreed to schedule her next follow up appointment today.  I, Michaelene Song, am acting as Location manager for Dennard Nip, MD  I have reviewed the above documentation for accuracy and completeness, and I agree with the above. -Dennard Nip, MD

## 2018-09-09 LAB — COMPREHENSIVE METABOLIC PANEL
ALT: 17 IU/L (ref 0–32)
AST: 14 IU/L (ref 0–40)
Albumin/Globulin Ratio: 1.5 (ref 1.2–2.2)
Albumin: 4.3 g/dL (ref 3.8–4.8)
Alkaline Phosphatase: 84 IU/L (ref 39–117)
BUN/Creatinine Ratio: 14 (ref 9–23)
BUN: 14 mg/dL (ref 6–20)
Bilirubin Total: 0.5 mg/dL (ref 0.0–1.2)
CO2: 21 mmol/L (ref 20–29)
Calcium: 9.4 mg/dL (ref 8.7–10.2)
Chloride: 102 mmol/L (ref 96–106)
Creatinine, Ser: 0.99 mg/dL (ref 0.57–1.00)
GFR calc Af Amer: 88 mL/min/{1.73_m2} (ref >59)
GFR calc non Af Amer: 76 mL/min/{1.73_m2} (ref >59)
Globulin, Total: 2.9 g/dL (ref 1.5–4.5)
Glucose: 79 mg/dL (ref 65–99)
Potassium: 3.8 mmol/L (ref 3.5–5.2)
Sodium: 139 mmol/L (ref 134–144)
Total Protein: 7.2 g/dL (ref 6.0–8.5)

## 2018-09-09 LAB — LIPID PANEL WITH LDL/HDL RATIO
Cholesterol, Total: 148 mg/dL (ref 100–199)
HDL: 36 mg/dL — ABNORMAL LOW (ref >39)
LDL Calculated: 93 mg/dL (ref 0–99)
LDl/HDL Ratio: 2.6 ratio (ref 0.0–3.2)
Triglycerides: 94 mg/dL (ref 0–149)
VLDL Cholesterol Cal: 19 mg/dL (ref 5–40)

## 2018-09-09 LAB — VITAMIN D 25 HYDROXY (VIT D DEFICIENCY, FRACTURES): Vit D, 25-Hydroxy: 42.8 ng/mL (ref 30.0–100.0)

## 2018-09-09 LAB — T3: T3, Total: 124 ng/dL (ref 71–180)

## 2018-09-09 LAB — INSULIN, RANDOM: INSULIN: 20 u[IU]/mL (ref 2.6–24.9)

## 2018-09-09 LAB — T4, FREE: Free T4: 1.33 ng/dL (ref 0.82–1.77)

## 2018-09-09 LAB — HEMOGLOBIN A1C
Est. average glucose Bld gHb Est-mCnc: 94 mg/dL
Hgb A1c MFr Bld: 4.9 % (ref 4.8–5.6)

## 2018-09-09 LAB — TSH: TSH: 2.32 u[IU]/mL (ref 0.450–4.500)

## 2018-09-16 ENCOUNTER — Other Ambulatory Visit: Payer: Self-pay

## 2018-09-17 ENCOUNTER — Ambulatory Visit: Payer: Medicaid Other | Admitting: Family Medicine

## 2018-09-17 ENCOUNTER — Ambulatory Visit (INDEPENDENT_AMBULATORY_CARE_PROVIDER_SITE_OTHER): Payer: Medicaid Other

## 2018-09-17 ENCOUNTER — Encounter: Payer: Self-pay | Admitting: Family Medicine

## 2018-09-17 ENCOUNTER — Other Ambulatory Visit: Payer: Self-pay

## 2018-09-17 VITALS — BP 117/81 | HR 102 | Temp 96.6°F | Ht 64.0 in | Wt 278.0 lb

## 2018-09-17 DIAGNOSIS — G8929 Other chronic pain: Secondary | ICD-10-CM

## 2018-09-17 DIAGNOSIS — M5442 Lumbago with sciatica, left side: Secondary | ICD-10-CM

## 2018-09-17 DIAGNOSIS — M546 Pain in thoracic spine: Secondary | ICD-10-CM | POA: Diagnosis not present

## 2018-09-17 DIAGNOSIS — M549 Dorsalgia, unspecified: Secondary | ICD-10-CM | POA: Diagnosis not present

## 2018-09-17 MED ORDER — KETOROLAC TROMETHAMINE 60 MG/2ML IM SOLN
60.0000 mg | Freq: Once | INTRAMUSCULAR | Status: AC
  Administered 2018-09-17: 60 mg via INTRAMUSCULAR

## 2018-09-17 MED ORDER — PREDNISONE 20 MG PO TABS: ORAL_TABLET | ORAL | 0 refills | Status: DC

## 2018-09-17 MED ORDER — NAPROXEN 500 MG PO TABS: 500.0000 mg | ORAL_TABLET | Freq: Two times a day (BID) | ORAL | 1 refills | Status: DC

## 2018-09-17 NOTE — Patient Instructions (Signed)
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
## 2018-09-21 ENCOUNTER — Encounter (INDEPENDENT_AMBULATORY_CARE_PROVIDER_SITE_OTHER): Payer: Self-pay | Admitting: Family Medicine

## 2018-09-21 NOTE — Telephone Encounter (Signed)
Please advise 

## 2018-09-21 NOTE — Telephone Encounter (Signed)
Lets change to telehealth and schedule an in office visit for her next visit.

## 2018-09-22 ENCOUNTER — Ambulatory Visit: Payer: Medicaid Other | Attending: Family Medicine | Admitting: Physical Therapy

## 2018-09-22 ENCOUNTER — Other Ambulatory Visit: Payer: Self-pay

## 2018-09-22 ENCOUNTER — Encounter (INDEPENDENT_AMBULATORY_CARE_PROVIDER_SITE_OTHER): Payer: Self-pay | Admitting: Family Medicine

## 2018-09-22 ENCOUNTER — Ambulatory Visit (INDEPENDENT_AMBULATORY_CARE_PROVIDER_SITE_OTHER): Payer: Medicaid Other | Admitting: Family Medicine

## 2018-09-22 VITALS — BP 136/86 | HR 111 | Temp 98.1°F | Ht 64.0 in | Wt 275.0 lb

## 2018-09-22 DIAGNOSIS — Z6841 Body Mass Index (BMI) 40.0 and over, adult: Secondary | ICD-10-CM | POA: Diagnosis not present

## 2018-09-22 DIAGNOSIS — M546 Pain in thoracic spine: Secondary | ICD-10-CM | POA: Insufficient documentation

## 2018-09-22 DIAGNOSIS — F418 Other specified anxiety disorders: Secondary | ICD-10-CM | POA: Diagnosis not present

## 2018-09-22 DIAGNOSIS — R293 Abnormal posture: Secondary | ICD-10-CM | POA: Insufficient documentation

## 2018-09-22 DIAGNOSIS — E559 Vitamin D deficiency, unspecified: Secondary | ICD-10-CM

## 2018-09-22 DIAGNOSIS — G8929 Other chronic pain: Secondary | ICD-10-CM | POA: Diagnosis not present

## 2018-09-22 DIAGNOSIS — M5442 Lumbago with sciatica, left side: Secondary | ICD-10-CM | POA: Insufficient documentation

## 2018-09-22 MED ORDER — VITAMIN D (ERGOCALCIFEROL) 1.25 MG (50000 UNIT) PO CAPS: 50000.0000 [IU] | ORAL_CAPSULE | ORAL | 0 refills | Status: DC
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
## 2018-09-24 NOTE — Progress Notes (Signed)
Office: [phone]  /  Fax: [phone]   HPI:   Chief Complaint: OBESITY Kelly Fry is here to discuss her progress with her obesity treatment plan. She is on the Pescatarian eating plan and is following her eating plan approximately 10 % of the time. She states she is exercising 0 minutes 0 times per week. Kelly Fry has been off track with weight loss. She changed to the [place], but she didn't follow it closely. She notes increased stress with her family lately, which has increased emotional eating. Her weight is 275 lb (124.7 kg) today and has had a weight gain of 3 pounds over a period of 2 weeks since her last visit. She has lost 18 lbs since starting treatment with [place].  Vitamin D deficiency Kelly Fry has a diagnosis of vitamin D deficiency. Kelly Fry is stable on vit D and she is almost at goal. She denies nausea, vomiting or muscle weakness.  Depression with anxiety Kelly Fry mood has gone down in the last two weeks. She is now on prednisone for her back, and that my be contributing. Kelly Fry struggles with emotional eating and using food for comfort to the extent that it is negatively impacting her health. She often snacks when she is not hungry. Kelly Fry sometimes feels she is out of control and then feels guilty that she made poor food choices. She has been working on behavior modification techniques to help reduce her emotional eating and has been somewhat successful. She shows no sign of suicidal or homicidal ideations.  ASSESSMENT AND PLAN:  Vitamin D deficiency - Plan: Vitamin D, Ergocalciferol, (DRISDOL) 1.25 MG (50000 UT) CAPS capsule  Depression with anxiety  Class 3 severe obesity with serious comorbidity and body mass index (BMI) of 45.0 to 49.9 in adult, unspecified obesity type ([place])  PLAN:  Vitamin D Deficiency Kelly Fry was informed that low vitamin D levels contributes to fatigue and are associated with obesity, breast, and colon cancer. Kelly Fry agrees to continue to take  prescription Vit D @50 ,000 IU every week #4 with no refills and she will follow up for routine testing of vitamin D, at least 2-3 times per year. She was informed of the risk of over-replacement of vitamin D and agrees to not increase her dose unless she discusses this with [place] first. Kelly Fry agrees to follow up with our clinic in 2 to 3 weeks.  Depression with anxiety We discussed behavior modification techniques today to help Kelly Fry deal with her emotional eating and depression. She will work on decreasing emotional eating and she will continue to follow the Category 2 plan. Kelly Fry will follow up as directed.  Obesity Kelly Fry is currently in the action stage of change. As such, her goal is to continue with weight loss efforts She has agreed to change to the Category 2 plan Kelly Fry has been instructed to work up to a goal of 150 minutes of combined cardio and strengthening exercise per week for weight loss and overall health benefits. We discussed the following Behavioral Modification Strategies today: increasing lean protein intake, decreasing simple carbohydrates and emotional eating strategies  Kelly Fry has agreed to follow up with our clinic in 2 to 3 weeks. She was informed of the importance of frequent follow up visits to maximize her success with intensive lifestyle modifications for her multiple health conditions.  ALLERGIES: Allergies  Allergen Reactions  . Latuda [Lurasidone] Other (See Comments)    Shaky; worsened mood    MEDICATIONS: Current Outpatient Medications on File Prior to Visit  Medication  Sig Dispense Refill  . albuterol (PROVENTIL HFA;VENTOLIN HFA) 108 (90 Base) MCG/ACT inhaler Inhale 2 puffs into the lungs every 6 (six) hours as needed for wheezing or shortness of breath. 1 Inhaler 2  . buPROPion (WELLBUTRIN SR) 150 MG 12 hr tablet Take 1 tablet (150 mg total) by mouth 2 (two) times daily. 60 tablet 0  . hydrochlorothiazide (MICROZIDE) 12.5 MG capsule Take 1 capsule (12.5  mg total) by mouth daily. 90 capsule 1  . metFORMIN (GLUCOPHAGE) 500 MG tablet Take 1 tablet (500 mg total) by mouth 2 (two) times daily with a meal. 60 tablet 0  . naproxen (NAPROSYN) 500 MG tablet Take 1 tablet (500 mg total) by mouth 2 (two) times daily with a meal. 60 tablet 1  . predniSONE (DELTASONE) 20 MG tablet 2 po at sametime daily for 5 days 10 tablet 0   No current facility-administered medications on file prior to visit.     PAST MEDICAL HISTORY: Past Medical History:  Diagnosis Date  . Ankle instability, right   . Anxiety   . Back pain   . Bipolar disorder ([place])   . Complication of anesthesia   . Depression   . Dyspnea   . Family history of adverse reaction to anesthesia    Dad had n/v post anesthesia  . GERD (gastroesophageal reflux disease)   . Leg edema   . Medical history non-contributory   . Miscarriage   . PONV (postoperative nausea and vomiting)   . Vitamin D deficiency     PAST SURGICAL HISTORY: Past Surgical History:  Procedure Laterality Date  . ANKLE RECONSTRUCTION Right 02/21/2017   Procedure: RIGHT LATERAL ANKLE RECONSTRUCTION WITH SPLIT PERONEAL LONGUS TENDON;  Surgeon: Marybelle Killings, MD;  Location: [facility];  Service: Orthopedics;  Laterality: Right;  . NO PAST SURGERIES    . WISDOM TOOTH EXTRACTION  04/12/2015    SOCIAL HISTORY: Social History   Tobacco Use  . Smoking status: Never Smoker  . Smokeless tobacco: Never Used  Substance Use Topics  . Alcohol use: No  . Drug use: No    FAMILY HISTORY: Family History  Problem Relation Age of Onset  . Kidney disease Father   . COPD Father   . Hypertension Father   . Depression Father   . Sleep apnea Father   . Obesity Father   . Kidney disease Sister   . Asthma Sister   . Early death Mother   . Stroke Mother   . Heart disease Mother   . Hypertension Mother   . Alcoholism Mother   . Asthma Brother     ROS: Review of Systems  Constitutional: Negative for weight loss.   Gastrointestinal: Negative for nausea and vomiting.  Musculoskeletal:       Negative for muscle weakness  Psychiatric/Behavioral: Positive for depression. Negative for suicidal ideas. The patient is nervous/anxious.     PHYSICAL EXAM: Blood pressure 136/86, pulse (!) 111, temperature 98.1 F (36.7 C), temperature source Oral, height 5\' 4"  (1.626 m), weight 275 lb (124.7 kg), SpO2 98 %. Body mass index is 47.2 kg/m. Physical Exam Vitals signs reviewed.  Constitutional:      Appearance: Normal appearance. She is well-developed. She is obese.  Cardiovascular:     Rate and Rhythm: Normal rate.  Pulmonary:     Effort: Pulmonary effort is normal.  Musculoskeletal: Normal range of motion.  Skin:    General: Skin is warm and dry.  Neurological:     Mental Status: She  is alert and oriented to person, place, and time.  Psychiatric:        Mood and Affect: Mood normal.        Behavior: Behavior normal.        Thought Content: Thought content does not include homicidal or suicidal ideation.     RECENT LABS AND TESTS: BMET    Component Value Date/Time   NA 139 09/08/2018 1422   K 3.8 09/08/2018 1422   CL 102 09/08/2018 1422   CO2 21 09/08/2018 1422   GLUCOSE 79 09/08/2018 1422   GLUCOSE 91 02/19/2017 0935   GLUCOSE 72 08/30/2013 1342   BUN 14 09/08/2018 1422   CREATININE 0.99 09/08/2018 1422   CREATININE 0.73 11/09/2013 1509   CALCIUM 9.4 09/08/2018 1422   GFRNONAA 76 09/08/2018 1422   GFRAA 88 09/08/2018 1422   Lab Results  Component Value Date   HGBA1C 4.9 09/08/2018   HGBA1C 5.1 03/05/2018   HGBA1C 5.0 12/02/2017   HGBA1C 5.2 09/02/2017   HGBA1C 5.0 04/18/2016   Lab Results  Component Value Date   INSULIN 20.0 09/08/2018   INSULIN 19.7 03/05/2018   INSULIN 20.3 12/02/2017   INSULIN 42.7 (H) 09/02/2017   CBC    Component Value Date/Time   WBC 7.3 09/02/2017 0942   WBC 7.6 02/19/2017 0935   RBC 5.18 09/02/2017 0942   RBC 4.95 02/19/2017 0935   HGB 15.3  09/02/2017 0942   HCT 45.5 09/02/2017 0942   PLT 247 02/19/2017 0935   PLT 240 04/18/2016 0832   MCV 88 09/02/2017 0942   MCH 29.5 09/02/2017 0942   MCH 28.5 02/19/2017 0935   MCHC 33.6 09/02/2017 0942   MCHC 32.6 02/19/2017 0935   RDW 13.3 09/02/2017 0942   LYMPHSABS 1.5 09/02/2017 0942   MONOABS 0.4 06/01/2013 0956   EOSABS 0.1 09/02/2017 0942   BASOSABS 0.0 09/02/2017 0942   Iron/TIBC/Ferritin/ %Sat    Component Value Date/Time   IRON 101 06/30/2015 1007   TIBC 360 06/30/2015 1007   FERRITIN 56 06/30/2015 1007   IRONPCTSAT 28 06/30/2015 1007   Lipid Panel     Component Value Date/Time   CHOL 148 09/08/2018 1422   TRIG 94 09/08/2018 1422   HDL 36 (L) 09/08/2018 1422   CHOLHDL 3.5 06/30/2015 1007   LDLCALC 93 09/08/2018 1422   LDLDIRECT 118 (H) 06/24/2017 1633   Hepatic Function Panel     Component Value Date/Time   PROT 7.2 09/08/2018 1422   ALBUMIN 4.3 09/08/2018 1422   AST 14 09/08/2018 1422   ALT 17 09/08/2018 1422   ALKPHOS 84 09/08/2018 1422   BILITOT 0.5 09/08/2018 1422      Component Value Date/Time   TSH 2.320 09/08/2018 1422   TSH 2.760 09/02/2017 0942   TSH 2.880 06/24/2017 1633     Ref. Range 09/08/2018 14:22  Vitamin D, 25-Hydroxy Latest Ref Range: 30.0 - 100.0 ng/mL 42.8    OBESITY BEHAVIORAL INTERVENTION VISIT  Today's visit was # 21   Starting weight: 293 lbs Starting date: 09/02/2017 Today's weight : 275 lbs Today's date: 09/22/2018 Total lbs lost to date: 18    09/22/2018  Height 5\' 4"  (1.626 m)  Weight 275 lb (124.7 kg)  BMI (Calculated) 47.18  BLOOD PRESSURE - SYSTOLIC 841  BLOOD PRESSURE - DIASTOLIC 86   Body Fat % 66.0 %  Total Body Water (lbs) 93.4 lbs    ASK: We discussed the diagnosis of obesity with Kelly Fry today and Kelly Fry  agreed to give [place] permission to discuss obesity behavioral modification therapy today.  ASSESS: Amiyrah has the diagnosis of obesity and her BMI today is 47.18 Wynn is in the action  stage of change   ADVISE: Sydny was educated on the multiple health risks of obesity as well as the benefit of weight loss to improve her health. She was advised of the need for long term treatment and the importance of lifestyle modifications to improve her current health and to decrease her risk of future health problems.  AGREE: Multiple dietary modification options and treatment options were discussed and  Kelly Fry agreed to follow the recommendations documented in the above note.  ARRANGE: Areliz was educated on the importance of frequent visits to treat obesity as outlined per CMS and USPSTF guidelines and agreed to schedule her next follow up appointment today.  Kelly Fry, Doreene Nest, am acting as transcriptionist for Dennard Nip, MD  Kelly Fry have reviewed the above documentation for accuracy and completeness, and Kelly Fry agree with the above. -Dennard Nip, MD

## 2018-09-29 ENCOUNTER — Other Ambulatory Visit: Payer: Self-pay

## 2018-09-29 ENCOUNTER — Ambulatory Visit: Payer: Medicaid Other | Admitting: Physical Therapy

## 2018-09-29 ENCOUNTER — Encounter: Payer: Self-pay | Admitting: Physical Therapy

## 2018-09-29 DIAGNOSIS — G8929 Other chronic pain: Secondary | ICD-10-CM | POA: Diagnosis not present

## 2018-09-29 DIAGNOSIS — M546 Pain in thoracic spine: Secondary | ICD-10-CM

## 2018-09-29 DIAGNOSIS — R293 Abnormal posture: Secondary | ICD-10-CM

## 2018-09-29 DIAGNOSIS — M5442 Lumbago with sciatica, left side: Secondary | ICD-10-CM | POA: Diagnosis not present

## 2018-09-29 NOTE — Therapy (Signed)
[facility] [place], [place], [address] Phone: [phone]   Fax:  [phone]  Physical Therapy Treatment  Patient Details  Name: Kelly Fry MRN: [id number] Date of Birth: 03/30/1985 Referring Provider (PT): Darla Lesches   Encounter Date: 09/29/2018  PT End of Session - 09/29/18 1521    Visit Number  2    Number of Visits  12    Date for PT Re-Evaluation  11/10/18    PT Start Time  7673    PT Stop Time  1606    PT Time Calculation (min)  51 min    Activity Tolerance  Patient tolerated treatment well    Behavior During Therapy  [facility] for tasks assessed/performed       Past Medical History:  Diagnosis Date  . Ankle instability, right   . Anxiety   . Back pain   . Bipolar disorder ([place])   . Complication of anesthesia   . Depression   . Dyspnea   . Family history of adverse reaction to anesthesia    Dad had n/v post anesthesia  . GERD (gastroesophageal reflux disease)   . Leg edema   . Medical history non-contributory   . Miscarriage   . PONV (postoperative nausea and vomiting)   . Vitamin D deficiency     Past Surgical History:  Procedure Laterality Date  . ANKLE RECONSTRUCTION Right 02/21/2017   Procedure: RIGHT LATERAL ANKLE RECONSTRUCTION WITH SPLIT PERONEAL LONGUS TENDON;  Surgeon: Marybelle Killings, MD;  Location: [facility];  Service: Orthopedics;  Laterality: Right;  . NO PAST SURGERIES    . WISDOM TOOTH EXTRACTION  04/12/2015    There were no vitals filed for this visit.  Subjective Assessment - 09/29/18 1520    Subjective  COVID-19 screening performed upon arrival. Patient reports feeling alright. pain is about 4.5/10    Pertinent History  GERD, Bi-polar, right ankle surgery.    How long can you sit comfortably?  ~15 minutes.    How long can you stand comfortably?  ~15 minutes.    Diagnostic tests  X-ray.    Patient Stated Goals  Get out of pain.    Currently in Pain?  Yes    Pain Score  4     Pain  Location  Back    Pain Orientation  Left;Lower;Mid    Pain Descriptors / Indicators  Sore    Pain Type  Chronic pain    Pain Onset  More than a month ago    Pain Frequency  Constant         [facility] PT Assessment - 09/29/18 0001      Assessment   Medical Diagnosis  Chronic low back pain with left-sided sciatica.  Chronic midline thoracic back pain.    Referring Provider (PT)  Darla Lesches      Precautions   Precautions  None      Restrictions   Weight Bearing Restrictions  No                   [facility] Adult PT Treatment/Exercise - 09/29/18 0001      Exercises   Exercises  Lumbar      Lumbar Exercises: Aerobic   Nustep  Level 4 x10 mins      Lumbar Exercises: Standing   Row  Strengthening;Both;20 reps    Shoulder Extension  Strengthening;Both;20 reps      Lumbar Exercises: Supine   Pelvic Tilt  20 reps;5 seconds  Clam  20 reps    Clam Limitations  red theraband      Modalities   Modalities  Electrical Stimulation;Moist Heat      Moist Heat Therapy   Number Minutes Moist Heat  10 Minutes    Moist Heat Location  Lumbar Spine      Electrical Stimulation   Electrical Stimulation Location  mid to low back    Electrical Stimulation Action  IFC    Electrical Stimulation Parameters  80-150 hz x 10 mins    Electrical Stimulation Goals  Pain                  PT Long Term Goals - 09/22/18 1235      PT LONG TERM GOAL #1   Title  Independent with a HEP.    Baseline  No knowledge of appropriate ther ex.    Time  6    Period  Weeks    Status  New      PT LONG TERM GOAL #2   Title  Sit 30 minutes with pain not > 3-4/10.    Baseline  Prolonged sitting can produce pain-levels near 10/10.    Time  6    Period  Weeks    Status  New      PT LONG TERM GOAL #3   Title  Stand 30 minutes with pain not > 3-4/10.    Baseline  Prolonged standing can produce pain-levels near 10/10.    Time  6    Period  Weeks    Status  New      PT LONG TERM GOAL #4    Title  Perform ADL's with pain not > 3-4/10.    Baseline  Performing ADL's can produce pain-levels near 10/10.    Time  6    Period  Weeks    Status  New            Plan - 09/29/18 1608    Clinical Impression Statement  Patient responded well to therapy session with no increase of pain throughout. Patient guided through core stability and postural exercises. Patient demonstrated good form after explanation. Patient provided with HEP and instructed to perform 1-2x per day. Patient reported agreement. Patient noted with normal response to modalities upon removal.    Personal Factors and Comorbidities  Comorbidity 1    Comorbidities  GERD, Bi-polar, right ankle surgery.    Examination-Activity Limitations  Stand;Sit    Examination-Participation Restrictions  Other    Stability/Clinical Decision Making  Stable/Uncomplicated    Clinical Decision Making  Low    Rehab Potential  Good    PT Frequency  2x / week    PT Duration  6 weeks    PT Treatment/Interventions  ADLs/Self Care Home Management;Cryotherapy;Electrical Stimulation;Ultrasound;Moist Heat;Traction;Therapeutic activities;Therapeutic exercise;Manual techniques;Patient/family education;Passive range of motion;Dry needling;Joint Manipulations;Spinal Manipulations    PT Next Visit Plan  Corner stretch, scapular retraction, posterior pelvic tilt instruct to reduce lordosis.  Core exercise progression.  Modalites PRN and STW/M.    Consulted and Agree with Plan of Care  Patient       Patient will benefit from skilled therapeutic intervention in order to improve the following deficits and impairments:  Pain, Decreased activity tolerance, Postural dysfunction  Visit Diagnosis: 1. Pain in thoracic spine   2. Chronic left-sided low back pain with left-sided sciatica   3. Abnormal posture        Problem List Patient Active Problem List   Diagnosis Date  Noted  . Chronic bilateral low back pain with left-sided sciatica 09/17/2018   . Chronic midline thoracic back pain 09/17/2018  . 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 Vitamin D deficiency 07/04/2015  . GAD (generalized anxiety disorder) 06/30/2015  . Mood disorder ([place]) 03/28/2015  . Morbid obesity with BMI of 45.0-49.9, adult ([place]) 12/21/2014  . Rh negative, antepartum 06/03/2013   Gabriela Eves, PT, DPT 09/29/2018, 7:50 PM  [facility] [place], [place], [address] Phone: [phone]   Fax:  [phone]  Name: Kelly Fry MRN: [id number] Date of Birth: 1985/05/16

## 2018-10-01 ENCOUNTER — Encounter: Payer: Self-pay | Admitting: Physical Therapy

## 2018-10-01 ENCOUNTER — Other Ambulatory Visit: Payer: Self-pay

## 2018-10-01 ENCOUNTER — Ambulatory Visit: Payer: Medicaid Other | Admitting: Physical Therapy

## 2018-10-01 DIAGNOSIS — M5442 Lumbago with sciatica, left side: Secondary | ICD-10-CM | POA: Diagnosis not present

## 2018-10-01 DIAGNOSIS — G8929 Other chronic pain: Secondary | ICD-10-CM

## 2018-10-01 DIAGNOSIS — M546 Pain in thoracic spine: Secondary | ICD-10-CM | POA: Diagnosis not present

## 2018-10-01 DIAGNOSIS — R293 Abnormal posture: Secondary | ICD-10-CM | POA: Diagnosis not present

## 2018-10-01 NOTE — Therapy (Signed)
[facility] [place], [place], [address] Phone: [phone]   Fax:  [phone]  Physical Therapy Treatment  Patient Details  Name: Kelly Fry MRN: [id number] Date of Birth: 11-13-1985 Referring Provider (PT): Darla Lesches   Encounter Date: 10/01/2018  PT End of Session - 10/01/18 1519    Visit Number  3    Number of Visits  12    Date for PT Re-Evaluation  11/10/18    PT Start Time  3818    PT Stop Time  1603    PT Time Calculation (min)  48 min    Activity Tolerance  Patient tolerated treatment well    Behavior During Therapy  [facility] for tasks assessed/performed       Past Medical History:  Diagnosis Date  . Ankle instability, right   . Anxiety   . Back pain   . Bipolar disorder ([place])   . Complication of anesthesia   . Depression   . Dyspnea   . Family history of adverse reaction to anesthesia    Dad had n/v post anesthesia  . GERD (gastroesophageal reflux disease)   . Leg edema   . Medical history non-contributory   . Miscarriage   . PONV (postoperative nausea and vomiting)   . Vitamin D deficiency     Past Surgical History:  Procedure Laterality Date  . ANKLE RECONSTRUCTION Right 02/21/2017   Procedure: RIGHT LATERAL ANKLE RECONSTRUCTION WITH SPLIT PERONEAL LONGUS TENDON;  Surgeon: Marybelle Killings, MD;  Location: [facility];  Service: Orthopedics;  Laterality: Right;  . NO PAST SURGERIES    . WISDOM TOOTH EXTRACTION  04/12/2015    There were no vitals filed for this visit.  Subjective Assessment - 10/01/18 1518    Subjective  COVID 19 screening performed on patient upon arrival. Patient reports having more mid back pain here recently.    Pertinent History  GERD, Bi-polar, right ankle surgery.    How long can you sit comfortably?  ~15 minutes.    How long can you stand comfortably?  ~15 minutes.    Diagnostic tests  X-ray.    Patient Stated Goals  Get out of pain.    Currently in Pain?  Yes    Pain  Score  6     Pain Location  Back    Pain Orientation  Lower;Mid    Pain Descriptors / Indicators  Dull;Aching    Pain Type  Chronic pain    Pain Onset  More than a month ago    Pain Frequency  Constant         [facility] PT Assessment - 10/01/18 0001      Assessment   Medical Diagnosis  Chronic low back pain with left-sided sciatica.  Chronic midline thoracic back pain.    Referring Provider (PT)  Darla Lesches    Next MD Visit  PRN in 6 weeks      Precautions   Precautions  None      Restrictions   Weight Bearing Restrictions  No                   [facility] Adult PT Treatment/Exercise - 10/01/18 0001      Lumbar Exercises: Aerobic   Nustep  L4 x15 min      Lumbar Exercises: Standing   Row  Strengthening;Both;20 reps    Row Limitations  green XTS    Shoulder Extension  Strengthening;Both;20 reps    Shoulder Extension Limitations  green XTS      Lumbar Exercises: Supine   Ab Set  10 reps;5 seconds    Clam  20 reps    Clam Limitations  red theraband    Bent Knee Raise  20 reps;2 seconds    Bridge  20 reps;3 seconds   minimal LBP reported     Modalities   Modalities  Electrical Stimulation;Moist Heat      Moist Heat Therapy   Number Minutes Moist Heat  15 Minutes    Moist Heat Location  Lumbar Spine      Electrical Stimulation   Electrical Stimulation Location  B thoracolumbar paraspinals    Electrical Stimulation Action  IFC    Electrical Stimulation Parameters  80-150 hz x15 min    Electrical Stimulation Goals  Pain                  PT Long Term Goals - 09/22/18 1235      PT LONG TERM GOAL #1   Title  Independent with a HEP.    Baseline  No knowledge of appropriate ther ex.    Time  6    Period  Weeks    Status  New      PT LONG TERM GOAL #2   Title  Sit 30 minutes with pain not > 3-4/10.    Baseline  Prolonged sitting can produce pain-levels near 10/10.    Time  6    Period  Weeks    Status  New      PT LONG TERM GOAL #3   Title   Stand 30 minutes with pain not > 3-4/10.    Baseline  Prolonged standing can produce pain-levels near 10/10.    Time  6    Period  Weeks    Status  New      PT LONG TERM GOAL #4   Title  Perform ADL's with pain not > 3-4/10.    Baseline  Performing ADL's can produce pain-levels near 10/10.    Time  6    Period  Weeks    Status  New            Plan - 10/01/18 1554    Clinical Impression Statement  Patient presented in clinic with reports of more midback pain as of recent. Patient unable to recall any worsening activity for back pain during treatment. Patiend understanding of posture effects on LBP and understanding regarding her weight effects of her LBP. Patient limited with walking secondary to previous ankle reconstruction surgery. Patient able to complete all therex with only minimal reports of discomfort in midback and low back with standing exercises and bridges respectively. Patient provided handout regarding TENS unit for home and educated to bring in unit once delivered for education on use. Normal modalities response noted following removal of the modalities.    Personal Factors and Comorbidities  Comorbidity 1    Comorbidities  GERD, Bi-polar, right ankle surgery.    Examination-Activity Limitations  Stand;Sit    Examination-Participation Restrictions  Other    Stability/Clinical Decision Making  Stable/Uncomplicated    Rehab Potential  Good    PT Frequency  2x / week    PT Duration  6 weeks    PT Treatment/Interventions  ADLs/Self Care Home Management;Cryotherapy;Electrical Stimulation;Ultrasound;Moist Heat;Traction;Therapeutic activities;Therapeutic exercise;Manual techniques;Patient/family education;Passive range of motion;Dry needling;Joint Manipulations;Spinal Manipulations    PT Next Visit Plan  Corner stretch, scapular retraction, posterior pelvic tilt instruct to reduce lordosis.  Core exercise progression.  Modalites PRN and STW/M.    Consulted and Agree with Plan  of Care  Patient       Patient will benefit from skilled therapeutic intervention in order to improve the following deficits and impairments:  Pain, Decreased activity tolerance, Postural dysfunction  Visit Diagnosis: Pain in thoracic spine  Chronic left-sided low back pain with left-sided sciatica  Abnormal posture     Problem List Patient Active Problem List   Diagnosis Date Noted  . Chronic bilateral low back pain with left-sided sciatica 09/17/2018  . Chronic midline thoracic back pain 09/17/2018  . 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 Vitamin D deficiency 07/04/2015  . GAD (generalized anxiety disorder) 06/30/2015  . Mood disorder ([place]) 03/28/2015  . Morbid obesity with BMI of 45.0-49.9, adult ([place]) 12/21/2014  . Rh negative, antepartum 06/03/2013    Standley Brooking, PTA 10/01/2018, 4:40 PM  [facility] [address] [place], [place], [address] Phone: [phone]   Fax:  [phone]  Name: Kelly Fry MRN: [id number] Date of Birth: 1985/12/26

## 2018-10-05 ENCOUNTER — Encounter: Payer: Self-pay | Admitting: Physical Therapy

## 2018-10-05 ENCOUNTER — Other Ambulatory Visit: Payer: Self-pay

## 2018-10-05 ENCOUNTER — Ambulatory Visit: Payer: Medicaid Other | Admitting: Physical Therapy

## 2018-10-05 DIAGNOSIS — M546 Pain in thoracic spine: Secondary | ICD-10-CM | POA: Diagnosis not present

## 2018-10-05 DIAGNOSIS — G8929 Other chronic pain: Secondary | ICD-10-CM

## 2018-10-05 DIAGNOSIS — R293 Abnormal posture: Secondary | ICD-10-CM

## 2018-10-05 DIAGNOSIS — M5442 Lumbago with sciatica, left side: Secondary | ICD-10-CM | POA: Diagnosis not present

## 2018-10-05 NOTE — Therapy (Signed)
[facility] [place], [place], [address] Phone: [phone]   Fax:  [phone]  Physical Therapy Treatment  Patient Details  Name: BREHANNA DEVENY MRN: [id number] Date of Birth: November 22, 1985 Referring Provider (PT): Darla Lesches   Encounter Date: 10/05/2018  PT End of Session - 10/05/18 1515    Visit Number  4    Number of Visits  12    Date for PT Re-Evaluation  11/10/18    PT Start Time  1512    PT Stop Time  1603    PT Time Calculation (min)  51 min    Activity Tolerance  Patient tolerated treatment well    Behavior During Therapy  [facility] for tasks assessed/performed       Past Medical History:  Diagnosis Date  . Ankle instability, right   . Anxiety   . Back pain   . Bipolar disorder ([place])   . Complication of anesthesia   . Depression   . Dyspnea   . Family history of adverse reaction to anesthesia    Dad had n/v post anesthesia  . GERD (gastroesophageal reflux disease)   . Leg edema   . Medical history non-contributory   . Miscarriage   . PONV (postoperative nausea and vomiting)   . Vitamin D deficiency     Past Surgical History:  Procedure Laterality Date  . ANKLE RECONSTRUCTION Right 02/21/2017   Procedure: RIGHT LATERAL ANKLE RECONSTRUCTION WITH SPLIT PERONEAL LONGUS TENDON;  Surgeon: Marybelle Killings, MD;  Location: [facility];  Service: Orthopedics;  Laterality: Right;  . NO PAST SURGERIES    . WISDOM TOOTH EXTRACTION  04/12/2015    There were no vitals filed for this visit.  Subjective Assessment - 10/05/18 1514    Subjective  COVID 19 screening performed on patient upon arrival. Patient reports that upon arrival she doesn't have any pain. Did have pain over the weekend but she was busy.    Pertinent History  GERD, Bi-polar, right ankle surgery.    How long can you sit comfortably?  ~15 minutes.    How long can you stand comfortably?  ~15 minutes.    Diagnostic tests  X-ray.    Patient Stated Goals  Get  out of pain.    Currently in Pain?  No/denies         [facility] - 10/05/18 0001      Assessment   Medical Diagnosis  Chronic low back pain with left-sided sciatica.  Chronic midline thoracic back pain.    Referring Provider (PT)  Darla Lesches    Next MD Visit  PRN in 6 weeks      Precautions   Precautions  None      Restrictions   Weight Bearing Restrictions  No                   [facility] Adult PT Treatment/Exercise - 10/05/18 0001      Lumbar Exercises: Aerobic   Nustep  L4 x15 min      Lumbar Exercises: Standing   Row  Strengthening;Both;20 reps    Row Limitations  Green XTS    Shoulder Extension  Strengthening;Both;20 reps    Shoulder Extension Limitations  Green XTS      Lumbar Exercises: Supine   Ab Set  15 reps;5 seconds    Glut Set  15 reps;5 seconds    Clam  20 reps;2 seconds    Clam Limitations  red theraband  Bent Knee Raise  20 reps;2 seconds    Bridge  20 reps;3 seconds      Modalities   Modalities  Teacher, English as a foreign language Location  B lumbar paraspinals    Electrical Stimulation Action  IFC    Electrical Stimulation Parameters  80-150 hz x15 min    Electrical Stimulation Goals  Other (comment)   per patient request for post-therex                 PT Long Term Goals - 10/05/18 1537      PT LONG TERM GOAL #1   Title  Independent with a HEP.    Baseline  No knowledge of appropriate ther ex.    Time  6    Period  Weeks    Status  Achieved      PT LONG TERM GOAL #2   Title  Sit 30 minutes with pain not > 3-4/10.    Baseline  Prolonged sitting can produce pain-levels near 10/10.    Time  6    Period  Weeks    Status  Partially Met   Depends on activity level during the day 10/05/2018     PT LONG TERM GOAL #3   Title  Stand 30 minutes with pain not > 3-4/10.    Baseline  Prolonged standing can produce pain-levels near 10/10.    Time  6    Period  Weeks     Status  On-going      PT LONG TERM GOAL #4   Title  Perform ADL's with pain not > 3-4/10.    Baseline  Performing ADL's can produce pain-levels near 10/10.    Time  6    Period  Weeks    Status  Partially Met            Plan - 10/05/18 1601    Clinical Impression Statement  Patient presented in clinic with reports of no current LBP and has not been affected by midback pain. Patient continues to be affected by LBP although it greatly depends on her activity level during the day. No pain reported during today's therex session and VCs for core activation provided throughout session. Normal stinulation response upon end of treatment per patient request.    Personal Factors and Comorbidities  Comorbidity 1    Comorbidities  GERD, Bi-polar, right ankle surgery.    Examination-Activity Limitations  Stand;Sit    Examination-Participation Restrictions  Other    Stability/Clinical Decision Making  Stable/Uncomplicated    Rehab Potential  Good    PT Frequency  2x / week    PT Duration  6 weeks    PT Treatment/Interventions  ADLs/Self Care Home Management;Cryotherapy;Electrical Stimulation;Ultrasound;Moist Heat;Traction;Therapeutic activities;Therapeutic exercise;Manual techniques;Patient/family education;Passive range of motion;Dry needling;Joint Manipulations;Spinal Manipulations    PT Next Visit Plan  Corner stretch, scapular retraction, posterior pelvic tilt instruct to reduce lordosis.  Core exercise progression.  Modalites PRN and STW/M.    Consulted and Agree with Plan of Care  Patient       Patient will benefit from skilled therapeutic intervention in order to improve the following deficits and impairments:  Pain, Decreased activity tolerance, Postural dysfunction  Visit Diagnosis: Pain in thoracic spine  Chronic left-sided low back pain with left-sided sciatica  Abnormal posture     Problem List Patient Active Problem List   Diagnosis Date Noted  . Chronic bilateral low  back pain with left-sided sciatica  09/17/2018  . Chronic midline thoracic back pain 09/17/2018  . 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 Vitamin D deficiency 07/04/2015  . GAD (generalized anxiety disorder) 06/30/2015  . Mood disorder ([place]) 03/28/2015  . Morbid obesity with BMI of 45.0-49.9, adult ([place]) 12/21/2014  . Rh negative, antepartum 06/03/2013    Standley Brooking, PTA 10/05/2018, 4:12 PM  [facility] [address] [place], [place], [address] Phone: [phone]   Fax:  [phone]  Name: SEINI LANNOM MRN: [id number] Date of Birth: Jun 19, 1985

## 2018-10-13 ENCOUNTER — Telehealth (INDEPENDENT_AMBULATORY_CARE_PROVIDER_SITE_OTHER): Payer: Medicaid Other | Admitting: Family Medicine

## 2018-10-13 ENCOUNTER — Encounter (INDEPENDENT_AMBULATORY_CARE_PROVIDER_SITE_OTHER): Payer: Self-pay | Admitting: Family Medicine

## 2018-10-13 ENCOUNTER — Other Ambulatory Visit: Payer: Self-pay

## 2018-10-13 DIAGNOSIS — E559 Vitamin D deficiency, unspecified: Secondary | ICD-10-CM | POA: Diagnosis not present

## 2018-10-13 DIAGNOSIS — Z6841 Body Mass Index (BMI) 40.0 and over, adult: Secondary | ICD-10-CM

## 2018-10-13 DIAGNOSIS — E8881 Metabolic syndrome: Secondary | ICD-10-CM

## 2018-10-13 DIAGNOSIS — F3289 Other specified depressive episodes: Secondary | ICD-10-CM | POA: Diagnosis not present

## 2018-10-13 MED ORDER — BUPROPION HCL ER (SR) 150 MG PO TB12: 150.0000 mg | ORAL_TABLET | Freq: Two times a day (BID) | ORAL | 0 refills | Status: DC

## 2018-10-13 MED ORDER — METFORMIN HCL 500 MG PO TABS: 500.0000 mg | ORAL_TABLET | ORAL | 0 refills | Status: DC

## 2018-10-13 MED ORDER — VITAMIN D (ERGOCALCIFEROL) 1.25 MG (50000 UNIT) PO CAPS: 50000.0000 [IU] | ORAL_CAPSULE | ORAL | 0 refills | Status: DC

## 2018-10-13 NOTE — Progress Notes (Addendum)
Office: [phone]  /  Fax: [phone] TeleHealth Visit:  Kelly Fry has verbally consented to this TeleHealth visit today. The patient is located at home, the provider is located at the [facility] office. The participants in this visit include the listed provider and patient. The visit was conducted today via telephone call (FaceTime failed - changed to telephone call). I spent > than 50% of the 25 minute visit on counseling as documented in the note.   HPI:   Chief Complaint: OBESITY Kelly Fry is here to discuss her progress with her obesity treatment plan. She is on the Category 2 plan and is following her eating plan approximately 75% of the time. She states she is exercising via low impact You Tube videos 20 minutes 2 times per week. Kelly Fry feels she has maintained her weight. She has not been following the plan closely and reports she is skipping breakfast often due to decreased hunger. We were unable to weigh the patient today for this TeleHealth visit. She feels as if she has maintained her weight since her last visit. She has lost 18 lbs since starting treatment with [place].  Insulin Resistance Kelly Fry has a diagnosis of insulin resistance based on her elevated fasting insulin level >5. Although Jadis's blood glucose readings are still under good control, insulin resistance puts her at greater risk of metabolic syndrome and diabetes. Kelly Fry notes decreased polyphagia and, in fact, struggles to eat all of her food with the increased dose of metformin. She continues to work on diet and exercise to decrease risk of diabetes.  Depression, Other Kelly Fry is struggling with emotional eating and using food for comfort to the extent that it is negatively impacting her health. She often snacks when she is not hungry. Doyce sometimes feels she is out of control and then feels guilty that she made poor food choices. She has been working on behavior modification techniques to help  reduce her emotional eating and has been somewhat successful. Kelly Fry's mood is stable. She reports decreased emotional eating and no insomnia, tremors, or palpitations. She shows no sign of suicidal or homicidal ideations.  Depression screen Kelly Fry  Decreased Interest 0 0 0 3 3  Down, Depressed, Hopeless 0 0 0 3 2  PHQ - 2 Score 0 0 0 6 5  Altered sleeping - 0 - 3 2  Tired, decreased energy - 0 - 3 3  Change in appetite - 0 - 3 3  Feeling bad or failure about yourself  - 0 - 3 3  Trouble concentrating - 0 - 3 2  Moving slowly or fidgety/restless - 0 - 3 2  Suicidal thoughts - 0 - 1 0  PHQ-9 Score - 0 - 25 20  Difficult doing work/chores - - - Very difficult -  Some recent data might be hidden   Vitamin D deficiency Kelly Fry has a diagnosis of Vitamin D deficiency, which is not yet at goal. She is currently stable on prescription Vit D and denies nausea, vomiting or muscle weakness.  ASSESSMENT AND PLAN:  Insulin resistance - Plan: metFORMIN (GLUCOPHAGE) 500 MG tablet  Vitamin D deficiency - Plan: Vitamin D, Ergocalciferol, (DRISDOL) 1.25 MG (50000 UT) CAPS capsule  Other depression - with emotional eating  - Plan: buPROPion (WELLBUTRIN SR) 150 MG 12 hr tablet  Class 3 severe obesity with serious comorbidity and body mass index (BMI) of 45.0 to 49.9 in adult, unspecified obesity type ([place])  PLAN:  Insulin Resistance Kelly Fry will continue to work on weight loss, exercise, and decreasing simple carbohydrates in her diet to help decrease the risk of diabetes. We dicussed metformin including benefits and risks. She was informed that eating too many simple carbohydrates or too many calories at one sitting increases the likelihood of GI side effects. Alaine will decrease metformin to 500 mg QAM #30 and will follow-up with [place] as directed to monitor her progress.  Depression with Emotional Eating Behaviors We discussed behavior modification  techniques today to help Airica deal with her emotional eating and depression. Joanne was given a refill on her Wellbutrin 150 mg #60 with 0 refills and agrees to follow-up with our clinic in 2 weeks.  Vitamin D Deficiency Kelly Fry was informed that low Vitamin D levels contributes to fatigue and are associated with obesity, breast, and colon cancer. She agrees to continue to take prescription Vit D @ 50,000 IU every week #4 with 0 refills and will follow-up for routine testing of Vitamin D, at least 2-3 times per year. She was informed of the risk of over-replacement of Vitamin D and agrees to not increase her dose unless she discusses this with [place] first. Leilanee agrees to follow-up with our clinic in 2 weeks.  Obesity Kelly Fry is currently in the action stage of change. As such, her goal is to continue with weight loss efforts. She has agreed to follow the Category 2 plan with 12-ounces of Fair Life milk for breakfast. Kelly Fry has been instructed to work up to a goal of 150 minutes of combined cardio and strengthening exercise per week for weight loss and overall health benefits. We discussed the following Behavioral Modification Strategies today: increasing lean protein intake and decreasing simple carbohydrates, and no skipping meals.  Kelly Fry has agreed to follow-up with our clinic in 2 weeks. She was informed of the importance of frequent follow-up visits to maximize her success with intensive lifestyle modifications for her multiple health conditions.  ALLERGIES: Allergies  Allergen Reactions  . Latuda [Lurasidone] Other (See Comments)    Shaky; worsened mood    MEDICATIONS: Current Outpatient Medications on File Prior to Visit  Medication Sig Dispense Refill  . albuterol (PROVENTIL HFA;VENTOLIN HFA) 108 (90 Base) MCG/ACT inhaler Inhale 2 puffs into the lungs every 6 (six) hours as needed for wheezing or shortness of breath. 1 Inhaler 2  . hydrochlorothiazide (MICROZIDE) 12.5 MG capsule Take  1 capsule (12.5 mg total) by mouth daily. 90 capsule 1  . naproxen (NAPROSYN) 500 MG tablet Take 1 tablet (500 mg total) by mouth 2 (two) times daily with a meal. 60 tablet 1   No current facility-administered medications on file prior to visit.     PAST MEDICAL HISTORY: Past Medical History:  Diagnosis Date  . Ankle instability, right   . Anxiety   . Back pain   . Bipolar disorder ([place])   . Complication of anesthesia   . Depression   . Dyspnea   . Family history of adverse reaction to anesthesia    Dad had n/v post anesthesia  . GERD (gastroesophageal reflux disease)   . Leg edema   . Medical history non-contributory   . Miscarriage   . PONV (postoperative nausea and vomiting)   . Vitamin D deficiency     PAST SURGICAL HISTORY: Past Surgical History:  Procedure Laterality Date  . ANKLE RECONSTRUCTION Right 02/21/2017   Procedure: RIGHT LATERAL ANKLE RECONSTRUCTION WITH SPLIT PERONEAL LONGUS TENDON;  Surgeon: Marybelle Killings, MD;  Location:  [facility];  Service: Orthopedics;  Laterality: Right;  . NO PAST SURGERIES    . WISDOM TOOTH EXTRACTION  04/12/2015    SOCIAL HISTORY: Social History   Tobacco Use  . Smoking status: Never Smoker  . Smokeless tobacco: Never Used  Substance Use Topics  . Alcohol use: No  . Drug use: No    FAMILY HISTORY: Family History  Problem Relation Age of Onset  . Kidney disease Father   . COPD Father   . Hypertension Father   . Depression Father   . Sleep apnea Father   . Obesity Father   . Kidney disease Sister   . Asthma Sister   . Early death Mother   . Stroke Mother   . Heart disease Mother   . Hypertension Mother   . Alcoholism Mother   . Asthma Brother    ROS: Review of Systems  Cardiovascular: Negative for palpitations.  Gastrointestinal: Negative for nausea and vomiting.  Musculoskeletal:       Negative for muscle weakness.  Neurological: Negative for tremors.  Endo/Heme/Allergies:       Positive for decreased  polyphagia.  Psychiatric/Behavioral: Positive for depression. The patient does not have insomnia.    PHYSICAL EXAM: Pt in no acute distress  RECENT LABS AND TESTS: BMET    Component Value Date/Time   NA 139 09/08/2018 1422   K 3.8 09/08/2018 1422   CL 102 09/08/2018 1422   CO2 21 09/08/2018 1422   GLUCOSE 79 09/08/2018 1422   GLUCOSE 91 02/19/2017 0935   GLUCOSE 72 08/30/2013 1342   BUN 14 09/08/2018 1422   CREATININE 0.99 09/08/2018 1422   CREATININE 0.73 11/09/2013 1509   CALCIUM 9.4 09/08/2018 1422   GFRNONAA 76 09/08/2018 1422   GFRAA 88 09/08/2018 1422   Lab Results  Component Value Date   HGBA1C 4.9 09/08/2018   HGBA1C 5.1 03/05/2018   HGBA1C 5.0 12/02/2017   HGBA1C 5.2 09/02/2017   HGBA1C 5.0 04/18/2016   Lab Results  Component Value Date   INSULIN 20.0 09/08/2018   INSULIN 19.7 03/05/2018   INSULIN 20.3 12/02/2017   INSULIN 42.7 (H) 09/02/2017   CBC    Component Value Date/Time   WBC 7.3 09/02/2017 0942   WBC 7.6 02/19/2017 0935   RBC 5.18 09/02/2017 0942   RBC 4.95 02/19/2017 0935   HGB 15.3 09/02/2017 0942   HCT 45.5 09/02/2017 0942   PLT 247 02/19/2017 0935   PLT 240 04/18/2016 0832   MCV 88 09/02/2017 0942   MCH 29.5 09/02/2017 0942   MCH 28.5 02/19/2017 0935   MCHC 33.6 09/02/2017 0942   MCHC 32.6 02/19/2017 0935   RDW 13.3 09/02/2017 0942   LYMPHSABS 1.5 09/02/2017 0942   MONOABS 0.4 06/01/2013 0956   EOSABS 0.1 09/02/2017 0942   BASOSABS 0.0 09/02/2017 0942   Iron/TIBC/Ferritin/ %Sat    Component Value Date/Time   IRON 101 06/30/2015 1007   TIBC 360 06/30/2015 1007   FERRITIN 56 06/30/2015 1007   IRONPCTSAT 28 06/30/2015 1007   Lipid Panel     Component Value Date/Time   CHOL 148 09/08/2018 1422   TRIG 94 09/08/2018 1422   HDL 36 (L) 09/08/2018 1422   CHOLHDL 3.5 06/30/2015 1007   LDLCALC 93 09/08/2018 1422   LDLDIRECT 118 (H) 06/24/2017 1633   Hepatic Function Panel     Component Value Date/Time   PROT 7.2 09/08/2018  1422   ALBUMIN 4.3 09/08/2018 1422   AST 14 09/08/2018 1422  ALT 17 09/08/2018 1422   ALKPHOS 84 09/08/2018 1422   BILITOT 0.5 09/08/2018 1422      Component Value Date/Time   TSH 2.320 09/08/2018 1422   TSH 2.760 09/02/2017 0942   TSH 2.880 06/24/2017 1633   Results for TAYJAH, ORBACH (MRN [id number]) as of 10/13/2018 12:12  Ref. Range 09/08/2018 14:22  Vitamin D, 25-Hydroxy Latest Ref Range: 30.0 - 100.0 ng/mL 42.8   I, Michaelene Song, am acting as Location manager for Dennard Nip, MD I have reviewed the above documentation for accuracy and completeness, and I agree with the above. -Dennard Nip, MD

## 2018-10-20 ENCOUNTER — Ambulatory Visit: Payer: Medicaid Other | Attending: Family Medicine | Admitting: Physical Therapy

## 2018-10-20 ENCOUNTER — Other Ambulatory Visit: Payer: Self-pay

## 2018-10-20 ENCOUNTER — Encounter: Payer: Self-pay | Admitting: Physical Therapy

## 2018-10-20 DIAGNOSIS — G8929 Other chronic pain: Secondary | ICD-10-CM | POA: Insufficient documentation

## 2018-10-20 DIAGNOSIS — M5442 Lumbago with sciatica, left side: Secondary | ICD-10-CM | POA: Insufficient documentation

## 2018-10-20 DIAGNOSIS — R293 Abnormal posture: Secondary | ICD-10-CM | POA: Insufficient documentation

## 2018-10-20 DIAGNOSIS — M546 Pain in thoracic spine: Secondary | ICD-10-CM | POA: Insufficient documentation

## 2018-10-20 NOTE — Therapy (Signed)
[facility] [place], [place], [address] Phone: [phone]   Fax:  [phone]  Physical Therapy Treatment  Patient Details  Name: Kelly Fry MRN: [id number] Date of Birth: Jul 05, 1985 Referring Provider (PT): Darla Lesches   Encounter Date: 10/20/2018  PT End of Session - 10/20/18 1533    Visit Number  5    Number of Visits  12    Date for PT Re-Evaluation  11/10/18    PT Start Time  0263    PT Stop Time  1608    PT Time Calculation (min)  52 min    Activity Tolerance  Patient tolerated treatment well    Behavior During Therapy  [facility] for tasks assessed/performed       Past Medical History:  Diagnosis Date  . Ankle instability, right   . Anxiety   . Back pain   . Bipolar disorder ([place])   . Complication of anesthesia   . Depression   . Dyspnea   . Family history of adverse reaction to anesthesia    Dad had n/v post anesthesia  . GERD (gastroesophageal reflux disease)   . Leg edema   . Medical history non-contributory   . Miscarriage   . PONV (postoperative nausea and vomiting)   . Vitamin D deficiency     Past Surgical History:  Procedure Laterality Date  . ANKLE RECONSTRUCTION Right 02/21/2017   Procedure: RIGHT LATERAL ANKLE RECONSTRUCTION WITH SPLIT PERONEAL LONGUS TENDON;  Surgeon: Marybelle Killings, MD;  Location: [facility];  Service: Orthopedics;  Laterality: Right;  . NO PAST SURGERIES    . WISDOM TOOTH EXTRACTION  04/12/2015    There were no vitals filed for this visit.  Subjective Assessment - 10/20/18 1514    Subjective  COVID 19 screening performed on patient upon arrival. Reports soreness in low back after bathing her dog yesterday.    Pertinent History  GERD, Bi-polar, right ankle surgery.    How long can you sit comfortably?  ~15 minutes.    How long can you stand comfortably?  ~15 minutes.    Diagnostic tests  X-ray.    Patient Stated Goals  Get out of pain.    Currently in Pain?  Yes    Pain  Score  6     Pain Location  Back    Pain Orientation  Lower    Pain Descriptors / Indicators  Sore    Pain Type  Chronic pain    Pain Onset  More than a month ago    Pain Frequency  Constant         [facility] PT Assessment - 10/20/18 0001      Assessment   Medical Diagnosis  Chronic low back pain with left-sided sciatica.  Chronic midline thoracic back pain.    Referring Provider (PT)  Darla Lesches    Next MD Visit  PRN in 6 weeks      Precautions   Precautions  None      Restrictions   Weight Bearing Restrictions  No                   [facility] Adult PT Treatment/Exercise - 10/20/18 0001      Lumbar Exercises: Aerobic   Nustep  L5 x20 min      Lumbar Exercises: Standing   Row  Strengthening;Both;Other (comment)   x30 reps   Row Limitations  Green XTS    Shoulder Extension  Strengthening;Both;Other (comment)  x30 reps   Shoulder Extension Limitations  Green XTS      Lumbar Exercises: Supine   Clam  20 reps;2 seconds    Clam Limitations  red theraband    Bent Knee Raise  15 reps;2 seconds    Bridge  15 reps      Modalities   Modalities  Electrical Stimulation      Moist Heat Therapy   Number Minutes Moist Heat  15 Minutes    Moist Heat Location  Lumbar Spine      Electrical Stimulation   Electrical Stimulation Location  B lumbar paraspinals    Electrical Stimulation Action  IFC    Electrical Stimulation Parameters  80-150 hz x15 min    Electrical Stimulation Goals  Pain                  PT Long Term Goals - 10/05/18 1537      PT LONG TERM GOAL #1   Title  Independent with a HEP.    Baseline  No knowledge of appropriate ther ex.    Time  6    Period  Weeks    Status  Achieved      PT LONG TERM GOAL #2   Title  Sit 30 minutes with pain not > 3-4/10.    Baseline  Prolonged sitting can produce pain-levels near 10/10.    Time  6    Period  Weeks    Status  Partially Met   Depends on activity level during the day 10/05/2018     PT  LONG TERM GOAL #3   Title  Stand 30 minutes with pain not > 3-4/10.    Baseline  Prolonged standing can produce pain-levels near 10/10.    Time  6    Period  Weeks    Status  On-going      PT LONG TERM GOAL #4   Title  Perform ADL's with pain not > 3-4/10.    Baseline  Performing ADL's can produce pain-levels near 10/10.    Time  6    Period  Weeks    Status  Partially Met            Plan - 10/20/18 1603    Clinical Impression Statement  Patient presented in clinic with reports of soreness in low back upon arrival. Patient able to complete therex well with core activation instructed throughout. Patient did report discomfort beginning at apprximately 15 reps of bridges. Normal modalities response noted following removal of the modalities.    Personal Factors and Comorbidities  Comorbidity 1    Comorbidities  GERD, Bi-polar, right ankle surgery.    Examination-Activity Limitations  Stand;Sit    Examination-Participation Restrictions  Other    Stability/Clinical Decision Making  Stable/Uncomplicated    Rehab Potential  Good    PT Frequency  2x / week    PT Duration  6 weeks    PT Treatment/Interventions  ADLs/Self Care Home Management;Cryotherapy;Electrical Stimulation;Ultrasound;Moist Heat;Traction;Therapeutic activities;Therapeutic exercise;Manual techniques;Patient/family education;Passive range of motion;Dry needling;Joint Manipulations;Spinal Manipulations    PT Next Visit Plan  Corner stretch, scapular retraction, posterior pelvic tilt instruct to reduce lordosis.  Core exercise progression.  Modalites PRN and STW/M.    Consulted and Agree with Plan of Care  Patient       Patient will benefit from skilled therapeutic intervention in order to improve the following deficits and impairments:  Pain, Decreased activity tolerance, Postural dysfunction  Visit Diagnosis: Pain in thoracic spine  Chronic left-sided low back pain with left-sided sciatica  Abnormal  posture     Problem List Patient Active Problem List   Diagnosis Date Noted  . Chronic bilateral low back pain with left-sided sciatica 09/17/2018  . Chronic midline thoracic back pain 09/17/2018  . 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 Vitamin D deficiency 07/04/2015  . GAD (generalized anxiety disorder) 06/30/2015  . Mood disorder ([place]) 03/28/2015  . Morbid obesity with BMI of 45.0-49.9, adult ([place]) 12/21/2014  . Rh negative, antepartum 06/03/2013    Standley Brooking, PTA 10/20/2018, 5:07 PM  [facility] [address] [place], [place], [address] Phone: [phone]   Fax:  [phone]  Name: Kelly Fry MRN: [id number] Date of Birth: 03/18/1985

## 2018-10-22 ENCOUNTER — Encounter: Payer: Self-pay | Admitting: Physical Therapy

## 2018-10-22 ENCOUNTER — Other Ambulatory Visit: Payer: Self-pay

## 2018-10-22 ENCOUNTER — Ambulatory Visit: Payer: Medicaid Other | Admitting: Physical Therapy

## 2018-10-22 DIAGNOSIS — R293 Abnormal posture: Secondary | ICD-10-CM | POA: Diagnosis not present

## 2018-10-22 DIAGNOSIS — M546 Pain in thoracic spine: Secondary | ICD-10-CM | POA: Diagnosis not present

## 2018-10-22 DIAGNOSIS — M5442 Lumbago with sciatica, left side: Secondary | ICD-10-CM

## 2018-10-22 DIAGNOSIS — G8929 Other chronic pain: Secondary | ICD-10-CM

## 2018-10-22 NOTE — Therapy (Addendum)
[facility] [place], [place], [address] Phone: [phone]   Fax:  [phone]  Physical Therapy Treatment PHYSICAL THERAPY DISCHARGE SUMMARY  Visits from Start of Care: 6  Current functional level related to goals / functional outcomes: See below   Remaining deficits: See goals   Education / Equipment: HEP Plan: Patient agrees to discharge.  Patient goals were partially met. Patient is being discharged due to not returning since the last visit.  ?????  Gabriela Eves, PT, DPT 04/20/20   Patient Details  Name: Kelly Fry MRN: [id number] Date of Birth: 04/18/1985 Referring Provider (PT): Darla Lesches   Encounter Date: 10/22/2018  PT End of Session - 10/22/18 1524    Visit Number  6    Number of Visits  12    Date for PT Re-Evaluation  11/10/18    PT Start Time  4917    PT Stop Time  1607    PT Time Calculation (min)  51 min    Activity Tolerance  Patient tolerated treatment well    Behavior During Therapy  [facility] for tasks assessed/performed       Past Medical History:  Diagnosis Date  . Ankle instability, right   . Anxiety   . Back pain   . Bipolar disorder ([place])   . Complication of anesthesia   . Depression   . Dyspnea   . Family history of adverse reaction to anesthesia    Dad had n/v post anesthesia  . GERD (gastroesophageal reflux disease)   . Leg edema   . Medical history non-contributory   . Miscarriage   . PONV (postoperative nausea and vomiting)   . Vitamin D deficiency     Past Surgical History:  Procedure Laterality Date  . ANKLE RECONSTRUCTION Right 02/21/2017   Procedure: RIGHT LATERAL ANKLE RECONSTRUCTION WITH SPLIT PERONEAL LONGUS TENDON;  Surgeon: Marybelle Killings, MD;  Location: [place];  Service: Orthopedics;  Laterality: Right;  . NO PAST SURGERIES    . WISDOM TOOTH EXTRACTION  04/12/2015    There were no vitals filed for this visit.  Subjective Assessment - 10/22/18 1522     Subjective  COVID 19 screening performed on patient upon arrival. Reports no soreness in low back today. Did bring in her new TENS unit for education.    Pertinent History  GERD, Bi-polar, right ankle surgery.    How long can you sit comfortably?  ~15 minutes.    How long can you stand comfortably?  ~15 minutes.    Diagnostic tests  X-ray.    Patient Stated Goals  Get out of pain.    Currently in Pain?  No/denies         [facility] PT Assessment - 10/22/18 0001      Assessment   Medical Diagnosis  Chronic low back pain with left-sided sciatica.  Chronic midline thoracic back pain.    Referring Provider (PT)  Darla Lesches    Next MD Visit  PRN in 6 weeks      Precautions   Precautions  None      Restrictions   Weight Bearing Restrictions  No                   [facility] Adult PT Treatment/Exercise - 10/22/18 0001      Lumbar Exercises: Aerobic   Nustep  L6 x15 min      Lumbar Exercises: Machines for Strengthening   Cybex Lumbar Extension  60# x20 reps  Lumbar Exercises: Standing   Functional Squats  15 reps   with lift to educate on lifting technique   Forward Lunge  10 reps;2 seconds   with 4# reachouts per LE   Row  Strengthening;Both;20 reps;Limitations    Row Limitations  Green XTS    Shoulder Extension  Strengthening;Both;20 reps;Limitations    Shoulder Extension Limitations  Green XTS      Lumbar Exercises: Supine   Bridge with Ball Squeeze  10 reps;3 seconds   more L low back discomfort 5/10 sharp     Modalities   Modalities  Teacher, English as a foreign language Location  B lumbar paraspinals    Electrical Stimulation Action  IFC    Electrical Stimulation Parameters  80-150 hz x15 min    Electrical Stimulation Goals  Pain                  PT Long Term Goals - 10/05/18 1537      PT LONG TERM GOAL #1   Title  Independent with a HEP.    Baseline  No knowledge of appropriate ther ex.    Time  6     Period  Weeks    Status  Achieved      PT LONG TERM GOAL #2   Title  Sit 30 minutes with pain not > 3-4/10.    Baseline  Prolonged sitting can produce pain-levels near 10/10.    Time  6    Period  Weeks    Status  Partially Met   Depends on activity level during the day 10/05/2018     PT LONG TERM GOAL #3   Title  Stand 30 minutes with pain not > 3-4/10.    Baseline  Prolonged standing can produce pain-levels near 10/10.    Time  6    Period  Weeks    Status  On-going      PT LONG TERM GOAL #4   Title  Perform ADL's with pain not > 3-4/10.    Baseline  Performing ADL's can produce pain-levels near 10/10.    Time  6    Period  Weeks    Status  Partially Met            Plan - 10/22/18 1558    Clinical Impression Statement  Patient presented in clinic with reports of no LBP. Patient progressed today with more functional lumbar and core strengthening exercises. Patient educated today for proper lifitng technique and proper squat technique as well. Good technique observed during lfiitng. 5/10 L LBP reported as reps progressed during bridges. Patient educated regarding set up of her home TENS unit. Patient verbalized understanding of all instructions during treatment. Normal modalities response noted following removal of the modalities.    Personal Factors and Comorbidities  Comorbidity 1    Comorbidities  GERD, Bi-polar, right ankle surgery.    Examination-Activity Limitations  Stand;Sit    Examination-Participation Restrictions  Other    Stability/Clinical Decision Making  Stable/Uncomplicated    Rehab Potential  Good    PT Frequency  2x / week    PT Duration  6 weeks    PT Treatment/Interventions  ADLs/Self Care Home Management;Cryotherapy;Electrical Stimulation;Ultrasound;Moist Heat;Traction;Therapeutic activities;Therapeutic exercise;Manual techniques;Patient/family education;Passive range of motion;Dry needling;Joint Manipulations;Spinal Manipulations    PT Next Visit  Plan  Corner stretch, scapular retraction, posterior pelvic tilt instruct to reduce lordosis.  Core exercise progression.  Modalites PRN and STW/M.  Consulted and Agree with Plan of Care  Patient       Patient will benefit from skilled therapeutic intervention in order to improve the following deficits and impairments:  Pain, Decreased activity tolerance, Postural dysfunction  Visit Diagnosis: Pain in thoracic spine  Chronic left-sided low back pain with left-sided sciatica  Abnormal posture     Problem List Patient Active Problem List   Diagnosis Date Noted  . Chronic bilateral low back pain with left-sided sciatica 09/17/2018  . Chronic midline thoracic back pain 09/17/2018  . 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 Vitamin D deficiency 07/04/2015  . GAD (generalized anxiety disorder) 06/30/2015  . Mood disorder ([place]) 03/28/2015  . Morbid obesity with BMI of 45.0-49.9, adult ([place]) 12/21/2014  . Rh negative, antepartum 06/03/2013    Standley Brooking, PTA 10/22/2018, 4:49 PM  [facility] [address] [place], [place], [address] Phone: [phone]   Fax:  [phone]  Name: ROCKLYN MAYBERRY MRN: [id number] Date of Birth: 1985/05/13

## 2018-10-29 ENCOUNTER — Other Ambulatory Visit: Payer: Self-pay

## 2018-10-29 ENCOUNTER — Telehealth (INDEPENDENT_AMBULATORY_CARE_PROVIDER_SITE_OTHER): Payer: Medicaid Other | Admitting: Family Medicine

## 2018-10-29 ENCOUNTER — Encounter (INDEPENDENT_AMBULATORY_CARE_PROVIDER_SITE_OTHER): Payer: Self-pay | Admitting: Family Medicine

## 2018-10-29 DIAGNOSIS — E8881 Metabolic syndrome: Secondary | ICD-10-CM

## 2018-10-29 DIAGNOSIS — F418 Other specified anxiety disorders: Secondary | ICD-10-CM

## 2018-10-29 DIAGNOSIS — Z6841 Body Mass Index (BMI) 40.0 and over, adult: Secondary | ICD-10-CM

## 2018-10-29 DIAGNOSIS — E559 Vitamin D deficiency, unspecified: Secondary | ICD-10-CM

## 2018-10-29 MED ORDER — METFORMIN HCL 500 MG PO TABS: 500.0000 mg | ORAL_TABLET | ORAL | 0 refills | Status: DC

## 2018-10-29 MED ORDER — ESCITALOPRAM OXALATE 10 MG PO TABS: 10.0000 mg | ORAL_TABLET | ORAL | 0 refills | Status: DC

## 2018-10-29 MED ORDER — VITAMIN D (ERGOCALCIFEROL) 1.25 MG (50000 UNIT) PO CAPS: 50000.0000 [IU] | ORAL_CAPSULE | ORAL | 0 refills | Status: DC

## 2018-11-03 NOTE — Progress Notes (Signed)
Office: [phone]  /  Fax: [phone] TeleHealth Visit:  Kelly Fry has verbally consented to this TeleHealth visit today. The patient is located at home, the provider is located at the [employer] [facility] office. The participants in this visit include the listed provider and patient. The visit was conducted today via doxyme.  HPI:   Chief Complaint: OBESITY Kelly Fry is here to discuss her progress with her obesity treatment plan. She is on the  follow the Category 2 plan and is following her eating plan approximately 40 % of the time. She states she is exercising 0 minutes 0 times per week. Kelly Fry states she has done well maintaining her weight since our last visit. She has increased stress with helping her daughter and niece with virtual learning and not doing as well meal planning.  We were unable to weigh the patient today for this TeleHealth visit. She feels as if she has maintained weight since her last visit. She has lost 18 lbs since starting treatment with [place].  Insulin Resistance Kelly Fry has a diagnosis of insulin resistance based on her elevated fasting insulin level >5. Although Kelly Fry's blood glucose readings are still under good control, insulin resistance puts her at greater risk of metabolic syndrome and diabetes. She is taking metformin currently and continues to work on diet and exercise to decrease risk of diabetes. She denies nausea, vomiting, or hypoglycemia.   Vitamin D deficiency Kelly Fry has a diagnosis of vitamin D deficiency. She is currently taking vit D and denies nausea, vomiting or muscle weakness.  Depression with anxiety Kelly Fry is struggling with emotional eating and using food for comfort to the extent that it is negatively impacting her health. She often snacks when she is not hungry. Kelly Fry sometimes feels she is out of control and then feels guilty that she made poor food choices. She has been working on behavior modification techniques to help  reduce her emotional eating and has been somewhat successful. She is stable on Wellbutrin but feels her anxiety has worsened with recent events. She has increased comfort eating.  She shows no sign of pressured speech, flights of ideations,  suicidal or homicidal ideations.  Depression screen Kelly Fry 2/9 09/17/2018 06/22/2018 04/24/2018 09/02/2017 08/28/2017  Decreased Interest 0 0 0 3 3  Down, Depressed, Hopeless 0 0 0 3 2  PHQ - 2 Score 0 0 0 6 5  Altered sleeping - 0 - 3 2  Tired, decreased energy - 0 - 3 3  Change in appetite - 0 - 3 3  Feeling bad or failure about yourself  - 0 - 3 3  Trouble concentrating - 0 - 3 2  Moving slowly or fidgety/restless - 0 - 3 2  Suicidal thoughts - 0 - 1 0  PHQ-9 Score - 0 - 25 20  Difficult doing work/chores - - - Very difficult -  Some recent data might be hidden    ASSESSMENT AND PLAN:  Insulin resistance - Plan: metFORMIN (GLUCOPHAGE) 500 MG tablet  Vitamin D deficiency - Plan: Vitamin D, Ergocalciferol, (DRISDOL) 1.25 MG (50000 UT) CAPS capsule  Depression with anxiety - Plan: escitalopram (LEXAPRO) 10 MG tablet  Class 3 severe obesity with serious comorbidity and body mass index (BMI) of 45.0 to 49.9 in adult, unspecified obesity type (HCC)  PLAN: Insulin Resistance Kelly Fry will continue to work on weight loss, exercise, and decreasing simple carbohydrates in her diet to help decrease the risk of diabetes. We dicussed metformin including benefits and risks.  She was informed that eating too many simple carbohydrates or too many calories at one sitting increases the likelihood of GI side effects. Kelly Fry agrees to continue Metformin 500 mg daily #30 with no refills.  Kelly Fry agreed to follow up with [place] as directed to monitor her progress.  Vitamin D Deficiency Kelly Fry was informed that low vitamin D levels contributes to fatigue and are associated with obesity, breast, and colon cancer. She agrees to continue to take prescription Vit D @50 ,000 IU every  week #4 with no refills and will follow up for routine testing of vitamin D, at least 2-3 times per year. She was informed of the risk of over-replacement of vitamin D and agrees to not increase her dose unless she discusses this with [place] first. Agrees to follow up with our clinic as directed.   Depression with Emotional Eating Behaviors We discussed behavior modification techniques today to help Kelly Fry deal with her emotional eating and depression. She has agreed to continue to take Wellbutrin SR 150 mg qd and start Lexapro 10 mg daily #30 with no refills and agreed to follow up as directed. We will follow closely.   Obesity Kelly Fry is currently in the action stage of change. As such, her goal is to continue with weight loss efforts She has agreed to follow the Category 2 plan Kelly Fry has been instructed to work up to a goal of 150 minutes of combined cardio and strengthening exercise per week for weight loss and overall health benefits. We discussed the following Behavioral Modification Strategies today: emotional eating strategies and keeping healthy foods in the home.    Kelly Fry has agreed to follow up with our clinic in 2-3 weeks. She was informed of the importance of frequent follow up visits to maximize her success with intensive lifestyle modifications for her multiple health conditions.  ALLERGIES: Allergies  Allergen Reactions  . Latuda [Lurasidone] Other (See Comments)    Shaky; worsened mood    MEDICATIONS: Current Outpatient Medications on File Prior to Visit  Medication Sig Dispense Refill  . albuterol (PROVENTIL HFA;VENTOLIN HFA) 108 (90 Base) MCG/ACT inhaler Inhale 2 puffs into the lungs every 6 (six) hours as needed for wheezing or shortness of breath. 1 Inhaler 2  . buPROPion (WELLBUTRIN SR) 150 MG 12 hr tablet Take 1 tablet (150 mg total) by mouth 2 (two) times daily. (Patient taking differently: Take 150 mg by mouth daily. ) 60 tablet 0  . hydrochlorothiazide (MICROZIDE)  12.5 MG capsule Take 1 capsule (12.5 mg total) by mouth daily. 90 capsule 1  . naproxen (NAPROSYN) 500 MG tablet Take 1 tablet (500 mg total) by mouth 2 (two) times daily with a meal. 60 tablet 1   No current facility-administered medications on file prior to visit.     PAST MEDICAL HISTORY: Past Medical History:  Diagnosis Date  . Ankle instability, right   . Anxiety   . Back pain   . Bipolar disorder (Kelly Fry)   . Complication of anesthesia   . Depression   . Dyspnea   . Family history of adverse reaction to anesthesia    Dad had n/v post anesthesia  . GERD (gastroesophageal reflux disease)   . Leg edema   . Medical history non-contributory   . Miscarriage   . PONV (postoperative nausea and vomiting)   . Vitamin D deficiency     PAST SURGICAL HISTORY: Past Surgical History:  Procedure Laterality Date  . ANKLE RECONSTRUCTION Right 02/21/2017   Procedure: RIGHT LATERAL ANKLE RECONSTRUCTION  WITH SPLIT PERONEAL LONGUS TENDON;  Surgeon: Marybelle Killings, MD;  Location: [place];  Service: Orthopedics;  Laterality: Right;  . NO PAST SURGERIES    . WISDOM TOOTH EXTRACTION  04/12/2015    SOCIAL HISTORY: Social History   Tobacco Use  . Smoking status: Never Smoker  . Smokeless tobacco: Never Used  Substance Use Topics  . Alcohol use: No  . Drug use: No    FAMILY HISTORY: Family History  Problem Relation Age of Onset  . Kidney disease Father   . COPD Father   . Hypertension Father   . Depression Father   . Sleep apnea Father   . Obesity Father   . Kidney disease Sister   . Asthma Sister   . Early death Mother   . Stroke Mother   . Heart disease Mother   . Hypertension Mother   . Alcoholism Mother   . Asthma Brother     ROS: Review of Systems  Gastrointestinal: Negative for nausea and vomiting.  Musculoskeletal:       Negative for muscle weakness  Endo/Heme/Allergies:       Negative for hypoglycemia   Psychiatric/Behavioral: Positive for depression. Negative for  suicidal ideas.       Negative for pressured speech  Negative for flights of ideas     PHYSICAL EXAM: Pt in no acute distress  RECENT LABS AND TESTS: BMET    Component Value Date/Time   NA 139 09/08/2018 1422   K 3.8 09/08/2018 1422   CL 102 09/08/2018 1422   CO2 21 09/08/2018 1422   GLUCOSE 79 09/08/2018 1422   GLUCOSE 91 02/19/2017 0935   GLUCOSE 72 08/30/2013 1342   BUN 14 09/08/2018 1422   CREATININE 0.99 09/08/2018 1422   CREATININE 0.73 11/09/2013 1509   CALCIUM 9.4 09/08/2018 1422   GFRNONAA 76 09/08/2018 1422   GFRAA 88 09/08/2018 1422   Lab Results  Component Value Date   HGBA1C 4.9 09/08/2018   HGBA1C 5.1 03/05/2018   HGBA1C 5.0 12/02/2017   HGBA1C 5.2 09/02/2017   HGBA1C 5.0 04/18/2016   Lab Results  Component Value Date   INSULIN 20.0 09/08/2018   INSULIN 19.7 03/05/2018   INSULIN 20.3 12/02/2017   INSULIN 42.7 (H) 09/02/2017   CBC    Component Value Date/Time   WBC 7.3 09/02/2017 0942   WBC 7.6 02/19/2017 0935   RBC 5.18 09/02/2017 0942   RBC 4.95 02/19/2017 0935   HGB 15.3 09/02/2017 0942   HCT 45.5 09/02/2017 0942   PLT 247 02/19/2017 0935   PLT 240 04/18/2016 0832   MCV 88 09/02/2017 0942   MCH 29.5 09/02/2017 0942   MCH 28.5 02/19/2017 0935   MCHC 33.6 09/02/2017 0942   MCHC 32.6 02/19/2017 0935   RDW 13.3 09/02/2017 0942   LYMPHSABS 1.5 09/02/2017 0942   MONOABS 0.4 06/01/2013 0956   EOSABS 0.1 09/02/2017 0942   BASOSABS 0.0 09/02/2017 0942   Iron/TIBC/Ferritin/ %Sat    Component Value Date/Time   IRON 101 06/30/2015 1007   TIBC 360 06/30/2015 1007   FERRITIN 56 06/30/2015 1007   IRONPCTSAT 28 06/30/2015 1007   Lipid Panel     Component Value Date/Time   CHOL 148 09/08/2018 1422   TRIG 94 09/08/2018 1422   HDL 36 (L) 09/08/2018 1422   CHOLHDL 3.5 06/30/2015 1007   LDLCALC 93 09/08/2018 1422   LDLDIRECT 118 (H) 06/24/2017 1633   Hepatic Function Panel     Component Value Date/Time  PROT 7.2 09/08/2018 1422    ALBUMIN 4.3 09/08/2018 1422   AST 14 09/08/2018 1422   ALT 17 09/08/2018 1422   ALKPHOS 84 09/08/2018 1422   BILITOT 0.5 09/08/2018 1422      Component Value Date/Time   TSH 2.320 09/08/2018 1422   TSH 2.760 09/02/2017 0942   TSH 2.880 06/24/2017 1633      I, Renee Ramus, am acting as Location manager for Dennard Nip, MD  I have reviewed the above documentation for accuracy and completeness, and I agree with the above. -Dennard Nip, MD

## 2018-11-16 ENCOUNTER — Other Ambulatory Visit: Payer: Self-pay

## 2018-11-16 ENCOUNTER — Encounter (INDEPENDENT_AMBULATORY_CARE_PROVIDER_SITE_OTHER): Payer: Self-pay | Admitting: Family Medicine

## 2018-11-16 ENCOUNTER — Ambulatory Visit (INDEPENDENT_AMBULATORY_CARE_PROVIDER_SITE_OTHER): Payer: Medicaid Other | Admitting: Family Medicine

## 2018-11-16 VITALS — BP 114/79 | HR 94 | Temp 98.7°F | Ht 64.0 in | Wt 275.0 lb

## 2018-11-16 DIAGNOSIS — E559 Vitamin D deficiency, unspecified: Secondary | ICD-10-CM

## 2018-11-16 DIAGNOSIS — Z6841 Body Mass Index (BMI) 40.0 and over, adult: Secondary | ICD-10-CM | POA: Diagnosis not present

## 2018-11-16 DIAGNOSIS — E8881 Metabolic syndrome: Secondary | ICD-10-CM | POA: Diagnosis not present

## 2018-11-16 DIAGNOSIS — F418 Other specified anxiety disorders: Secondary | ICD-10-CM

## 2018-11-16 DIAGNOSIS — Z9189 Other specified personal risk factors, not elsewhere classified: Secondary | ICD-10-CM | POA: Diagnosis not present

## 2018-11-16 DIAGNOSIS — E88819 Insulin resistance, unspecified: Secondary | ICD-10-CM

## 2018-11-16 MED ORDER — METFORMIN HCL 500 MG PO TABS: 500.0000 mg | ORAL_TABLET | ORAL | 0 refills | Status: DC

## 2018-11-16 MED ORDER — ESCITALOPRAM OXALATE 10 MG PO TABS: 10.0000 mg | ORAL_TABLET | ORAL | 0 refills | Status: DC

## 2018-11-16 MED ORDER — VITAMIN D (ERGOCALCIFEROL) 1.25 MG (50000 UNIT) PO CAPS: 50000.0000 [IU] | ORAL_CAPSULE | ORAL | 0 refills | Status: DC

## 2018-11-16 MED ORDER — BUPROPION HCL ER (SR) 150 MG PO TB12: 150.0000 mg | ORAL_TABLET | Freq: Every day | ORAL | 0 refills | Status: DC

## 2018-11-18 NOTE — Progress Notes (Signed)
Office: [phone]  /  Fax: [phone]   HPI:   Chief Complaint: OBESITY Kelly Fry is here to discuss her progress with her obesity treatment plan. She is on the Category 2 plan and is following her eating plan approximately 50 % of the time. She states she is exercising 0 minutes 0 times per week. Kelly Fry's last in-office visit was 09/22/18. She is eating all of the food on the plan. However, she has regular soda almost daily. Her weight is 275 lb (124.7 kg) today and she has maintained weight since her last in-office visit. She has lost 18 lbs since starting treatment with [place].  Insulin Resistance Kelly Fry has a diagnosis of insulin resistance based on her elevated fasting insulin level >5. Although Kelly Fry's blood glucose readings are still under good control, insulin resistance puts her at greater risk of metabolic syndrome and diabetes. Kelly Fry is taking metformin currently and she continues to work on diet and exercise to decrease risk of diabetes. Kelly Fry denies polyphagia. Lab Results  Component Value Date   HGBA1C 4.9 09/08/2018    Vitamin D deficiency Kelly Fry has a diagnosis of vitamin D deficiency. She is currently taking vit D and she is nearly at goal.  Her last vitamin D level was at 42.8 on 09/08/18. Kelly Fry denies nausea, vomiting or muscle weakness.  At risk for osteopenia and osteoporosis Kelly Fry is at higher risk of osteopenia and osteoporosis due to vitamin D deficiency.   Depression with anxiety Kelly Fry's mood is stable. Lexapro was added at the last office visit. Stress eating is well controlled, and anxiety is fairly well controlled. Kelly Fry reports that she has bipolar disorder. She shows no sign of suicidal or homicidal ideations.  ASSESSMENT AND PLAN:  Insulin resistance - Plan: metFORMIN (GLUCOPHAGE) 500 MG tablet  Vitamin D deficiency - Plan: Vitamin D, Ergocalciferol, (DRISDOL) 1.25 MG (50000 UT) CAPS capsule  Depression with anxiety - Plan: buPROPion (WELLBUTRIN  SR) 150 MG 12 hr tablet, escitalopram (LEXAPRO) 10 MG tablet  At risk for osteoporosis  Class 3 severe obesity with serious comorbidity and body mass index (BMI) of 45.0 to 49.9 in adult, unspecified obesity type ([place])  PLAN:  Insulin Resistance Kelly Fry will continue to work on weight loss, exercise, and decreasing simple carbohydrates in her diet to help decrease the risk of diabetes. We dicussed metformin including benefits and risks. She was informed that eating too many simple carbohydrates or too many calories at one sitting increases the likelihood of GI side effects. Kelly Fry agrees to continue metformin 500 mg qAM #30 with no refills and follow up with [place] as directed to monitor her progress.  Vitamin D Deficiency Kelly Fry was informed that low vitamin D levels contributes to fatigue and are associated with obesity, breast, and colon cancer. Kelly Fry agrees to continue to take prescription Vit D @50 ,000 IU every week #4 with no refills and she will follow up for routine testing of vitamin D, at least 2-3 times per year. She was informed of the risk of over-replacement of vitamin D and agrees to not increase her dose unless she discusses this with [place] first. Kelly Fry agrees to follow up with our clinic in 2 weeks.  At risk for osteopenia and osteoporosis Kelly Fry was given extended  (15 minutes) osteoporosis prevention counseling today. Kelly Fry is at risk for osteopenia and osteoporosis due to her vitamin D deficiency. She was encouraged to take her vitamin D and follow her higher calcium diet and increase strengthening exercise to help strengthen her bones and  decrease her risk of osteopenia and osteoporosis.  Depression with anxiety She agrees to continue Lexapro 10 mg daily #30 with no refills and Bupropion SR 150 mg daily #30 with no refills and follow up as directed.  Obesity Kelly Fry is currently in the action stage of change. As such, her goal is to continue with weight loss efforts She has  agreed to follow the Category 2 plan Kelly Fry will walk for 20 minutes, 2 days per week for weight loss and overall health benefits. We discussed the following Behavioral Modification Strategies today: planning for success, increasing lean protein intake, travel eating strategies and decrease liquid calories  Kelly Fry has agreed to follow up with our clinic in 2 weeks. She was informed of the importance of frequent follow up visits to maximize her success with intensive lifestyle modifications for her multiple health conditions.  ALLERGIES: Allergies  Allergen Reactions  . Latuda [Lurasidone] Other (See Comments)    Shaky; worsened mood    MEDICATIONS: Current Outpatient Medications on File Prior to Visit  Medication Sig Dispense Refill  . albuterol (PROVENTIL HFA;VENTOLIN HFA) 108 (90 Base) MCG/ACT inhaler Inhale 2 puffs into the lungs every 6 (six) hours as needed for wheezing or shortness of breath. 1 Inhaler 2  . hydrochlorothiazide (MICROZIDE) 12.5 MG capsule Take 1 capsule (12.5 mg total) by mouth daily. 90 capsule 1  . naproxen (NAPROSYN) 500 MG tablet Take 1 tablet (500 mg total) by mouth 2 (two) times daily with a meal. 60 tablet 1   No current facility-administered medications on file prior to visit.     PAST MEDICAL HISTORY: Past Medical History:  Diagnosis Date  . Ankle instability, right   . Anxiety   . Back pain   . Bipolar disorder ([place])   . Complication of anesthesia   . Depression   . Dyspnea   . Family history of adverse reaction to anesthesia    Dad had n/v post anesthesia  . GERD (gastroesophageal reflux disease)   . Leg edema   . Medical history non-contributory   . Miscarriage   . PONV (postoperative nausea and vomiting)   . Vitamin D deficiency     PAST SURGICAL HISTORY: Past Surgical History:  Procedure Laterality Date  . ANKLE RECONSTRUCTION Right 02/21/2017   Procedure: RIGHT LATERAL ANKLE RECONSTRUCTION WITH SPLIT PERONEAL LONGUS TENDON;  Surgeon:  Marybelle Killings, MD;  Location: [place] [facility];  Service: Orthopedics;  Laterality: Right;  . NO PAST SURGERIES    . WISDOM TOOTH EXTRACTION  04/12/2015    SOCIAL HISTORY: Social History   Tobacco Use  . Smoking status: Never Smoker  . Smokeless tobacco: Never Used  Substance Use Topics  . Alcohol use: No  . Drug use: No    FAMILY HISTORY: Family History  Problem Relation Age of Onset  . Kidney disease Father   . COPD Father   . Hypertension Father   . Depression Father   . Sleep apnea Father   . Obesity Father   . Kidney disease Sister   . Asthma Sister   . Early death Mother   . Stroke Mother   . Heart disease Mother   . Hypertension Mother   . Alcoholism Mother   . Asthma Brother     ROS: Review of Systems  Constitutional: Negative for weight loss.  Gastrointestinal: Negative for nausea and vomiting.  Musculoskeletal:       Negative for muscle weakness  Endo/Heme/Allergies:       Negative for polyphagia  Psychiatric/Behavioral: Positive for depression. Negative for suicidal ideas.    PHYSICAL EXAM: Blood pressure 114/79, pulse 94, temperature 98.7 F (37.1 C), temperature source Oral, height 5\' 4"  (1.626 m), weight 275 lb (124.7 kg), SpO2 98 %. Body mass index is 47.2 kg/m. Physical Exam Vitals signs reviewed.  Constitutional:      Appearance: Normal appearance. She is well-developed. She is obese.  Cardiovascular:     Rate and Rhythm: Normal rate.  Pulmonary:     Effort: Pulmonary effort is normal.  Musculoskeletal: Normal range of motion.  Skin:    General: Skin is warm and dry.  Neurological:     Mental Status: She is alert and oriented to person, place, and time.  Psychiatric:        Mood and Affect: Mood normal.        Behavior: Behavior normal.        Thought Content: Thought content does not include homicidal or suicidal ideation.     RECENT LABS AND TESTS: BMET    Component Value Date/Time   NA 139 09/08/2018 1422   K 3.8 09/08/2018 1422    CL 102 09/08/2018 1422   CO2 21 09/08/2018 1422   GLUCOSE 79 09/08/2018 1422   GLUCOSE 91 02/19/2017 0935   GLUCOSE 72 08/30/2013 1342   BUN 14 09/08/2018 1422   CREATININE 0.99 09/08/2018 1422   CREATININE 0.73 11/09/2013 1509   CALCIUM 9.4 09/08/2018 1422   GFRNONAA 76 09/08/2018 1422   GFRAA 88 09/08/2018 1422   Lab Results  Component Value Date   HGBA1C 4.9 09/08/2018   HGBA1C 5.1 03/05/2018   HGBA1C 5.0 12/02/2017   HGBA1C 5.2 09/02/2017   HGBA1C 5.0 04/18/2016   Lab Results  Component Value Date   INSULIN 20.0 09/08/2018   INSULIN 19.7 03/05/2018   INSULIN 20.3 12/02/2017   INSULIN 42.7 (H) 09/02/2017   CBC    Component Value Date/Time   WBC 7.3 09/02/2017 0942   WBC 7.6 02/19/2017 0935   RBC 5.18 09/02/2017 0942   RBC 4.95 02/19/2017 0935   HGB 15.3 09/02/2017 0942   HCT 45.5 09/02/2017 0942   PLT 247 02/19/2017 0935   PLT 240 04/18/2016 0832   MCV 88 09/02/2017 0942   MCH 29.5 09/02/2017 0942   MCH 28.5 02/19/2017 0935   MCHC 33.6 09/02/2017 0942   MCHC 32.6 02/19/2017 0935   RDW 13.3 09/02/2017 0942   LYMPHSABS 1.5 09/02/2017 0942   MONOABS 0.4 06/01/2013 0956   EOSABS 0.1 09/02/2017 0942   BASOSABS 0.0 09/02/2017 0942   Iron/TIBC/Ferritin/ %Sat    Component Value Date/Time   IRON 101 06/30/2015 1007   TIBC 360 06/30/2015 1007   FERRITIN 56 06/30/2015 1007   IRONPCTSAT 28 06/30/2015 1007   Lipid Panel     Component Value Date/Time   CHOL 148 09/08/2018 1422   TRIG 94 09/08/2018 1422   HDL 36 (L) 09/08/2018 1422   CHOLHDL 3.5 06/30/2015 1007   LDLCALC 93 09/08/2018 1422   LDLDIRECT 118 (H) 06/24/2017 1633   Hepatic Function Panel     Component Value Date/Time   PROT 7.2 09/08/2018 1422   ALBUMIN 4.3 09/08/2018 1422   AST 14 09/08/2018 1422   ALT 17 09/08/2018 1422   ALKPHOS 84 09/08/2018 1422   BILITOT 0.5 09/08/2018 1422      Component Value Date/Time   TSH 2.320 09/08/2018 1422   TSH 2.760 09/02/2017 0942   TSH 2.880  06/24/2017 1633  Ref. Range 09/08/2018 14:22  Vitamin D, 25-Hydroxy Latest Ref Range: 30.0 - 100.0 ng/mL 42.8    OBESITY BEHAVIORAL INTERVENTION VISIT  Today's visit was # 24  Starting weight: 293 lbs Starting date: 09/02/2017 Today's weight : 275 lbs Today's date: 11/16/2018 Total lbs lost to date: 18    11/16/2018  Height 5\' 4"  (1.626 m)  Weight 275 lb (124.7 kg)  BMI (Calculated) 47.18  BLOOD PRESSURE - SYSTOLIC 99991111  BLOOD PRESSURE - DIASTOLIC 79   Body Fat % [id number] %  Total Body Water (lbs) 96.4 lbs    ASK: We discussed the diagnosis of obesity with Kelly Fry today and Kelly Fry agreed to give [place] permission to discuss obesity behavioral modification therapy today.  ASSESS: Winnefred has the diagnosis of obesity and her BMI today is 47.18 Carinna is in the action stage of change   ADVISE: Jeniffer was educated on the multiple health risks of obesity as well as the benefit of weight loss to improve her health. She was advised of the need for long term treatment and the importance of lifestyle modifications to improve her current health and to decrease her risk of future health problems.  AGREE: Multiple dietary modification options and treatment options were discussed and  Frenchie agreed to follow the recommendations documented in the above note.  ARRANGE: Soren was educated on the importance of frequent visits to treat obesity as outlined per CMS and USPSTF guidelines and agreed to schedule her next follow up appointment today.  I, Kelly Fry, am acting as transcriptionist for [employer], FNP-C  I have reviewed the above documentation for accuracy and completeness, and I agree with the above.  - Kelly Levitz, FNP-C.

## 2018-11-19 ENCOUNTER — Encounter (INDEPENDENT_AMBULATORY_CARE_PROVIDER_SITE_OTHER): Payer: Self-pay | Admitting: Family Medicine

## 2018-11-19 DIAGNOSIS — F418 Other specified anxiety disorders: Secondary | ICD-10-CM | POA: Insufficient documentation

## 2018-11-19 DIAGNOSIS — E8881 Metabolic syndrome: Secondary | ICD-10-CM | POA: Insufficient documentation

## 2018-11-19 DIAGNOSIS — Z6841 Body Mass Index (BMI) 40.0 and over, adult: Secondary | ICD-10-CM | POA: Insufficient documentation

## 2018-11-19 DIAGNOSIS — E88819 Insulin resistance, unspecified: Secondary | ICD-10-CM | POA: Insufficient documentation

## 2018-11-26 ENCOUNTER — Encounter (INDEPENDENT_AMBULATORY_CARE_PROVIDER_SITE_OTHER): Payer: Self-pay

## 2018-11-30 ENCOUNTER — Ambulatory Visit (INDEPENDENT_AMBULATORY_CARE_PROVIDER_SITE_OTHER): Payer: Medicaid Other | Admitting: Family Medicine

## 2018-11-30 ENCOUNTER — Other Ambulatory Visit: Payer: Self-pay

## 2018-11-30 ENCOUNTER — Encounter (INDEPENDENT_AMBULATORY_CARE_PROVIDER_SITE_OTHER): Payer: Self-pay | Admitting: Family Medicine

## 2018-11-30 VITALS — BP 114/77 | HR 84 | Temp 98.9°F | Ht 64.0 in | Wt 273.0 lb

## 2018-11-30 DIAGNOSIS — E559 Vitamin D deficiency, unspecified: Secondary | ICD-10-CM | POA: Diagnosis not present

## 2018-11-30 DIAGNOSIS — Z6841 Body Mass Index (BMI) 40.0 and over, adult: Secondary | ICD-10-CM | POA: Diagnosis not present

## 2018-11-30 DIAGNOSIS — Z9189 Other specified personal risk factors, not elsewhere classified: Secondary | ICD-10-CM

## 2018-11-30 DIAGNOSIS — F418 Other specified anxiety disorders: Secondary | ICD-10-CM | POA: Diagnosis not present

## 2018-11-30 MED ORDER — VITAMIN D (ERGOCALCIFEROL) 1.25 MG (50000 UNIT) PO CAPS: 50000.0000 [IU] | ORAL_CAPSULE | ORAL | 0 refills | Status: DC

## 2018-11-30 MED ORDER — ESCITALOPRAM OXALATE 10 MG PO TABS: 10.0000 mg | ORAL_TABLET | ORAL | 0 refills | Status: DC

## 2018-12-02 NOTE — Progress Notes (Signed)
Office: [phone]  /  Fax: [phone]   HPI:   Chief Complaint: OBESITY Kelly Fry is here to discuss her progress with her obesity treatment plan. She is on the Category 2 plan and is following her eating plan approximately 80 % of the time. She states she is exercising 0 minutes 0 times per week. Kelly Fry has decreased soda from 6 cans of soda weekly to 2 to 3 cans weekly. Emmali skips breakfast sometimes. Her weight is 273 lb (123.8 kg) today and has had a weight loss of 2 pounds over a period of 2 weeks since her last visit. She has lost 20 lbs since starting treatment with [place].  Vitamin D deficiency Kelly Fry has a diagnosis of vitamin D deficiency. Her last vitamin D level was at 42.8 on 09/08/18 and was not at goal. Kelly Fry is currently taking vit D and she denies nausea, vomiting or muscle weakness.  At risk for osteopenia and osteoporosis Kelly Fry is at higher risk of osteopenia and osteoporosis due to vitamin D deficiency.   Depression with emotional eating behaviors Her mood is stable on Lexapro. Her anxiety has decreased. She has been on Lexapro for about one month. Kelly Fry is struggling with emotional eating and using food for comfort to the extent that it is negatively impacting her health. She often snacks when she is not hungry. Kelly Fry sometimes feels she is out of control and then feels guilty that she made poor food choices.  She has been working on behavior modification techniques to help reduce her emotional eating and has been somewhat successful. She shows no sign of suicidal or homicidal ideations.  ASSESSMENT AND PLAN:  Vitamin D deficiency - Plan: Vitamin D, Ergocalciferol, (DRISDOL) 1.25 MG (50000 UT) CAPS capsule  Depression with anxiety - Plan: escitalopram (LEXAPRO) 10 MG tablet  At risk for osteoporosis  Class 3 severe obesity with serious comorbidity and body mass index (BMI) of 45.0 to 49.9 in adult, unspecified obesity type (Kelly Fry)  PLAN:  Vitamin D Deficiency  Kelly Fry was informed that low vitamin D levels contributes to fatigue and are associated with obesity, breast, and colon cancer. Kelly Fry agrees to continue to take prescription Vit D @50 ,000 IU every week #4 with no refills and she will follow up for routine testing of vitamin D, at least 2-3 times per year. She was informed of the risk of over-replacement of vitamin D and agrees to not increase her dose unless she discusses this with [place] first. Kelly Fry agrees to follow up with our clinic 2 to 3 weeks.  At risk for osteopenia and osteoporosis Kelly Fry was given extended  (15 minutes) osteoporosis prevention counseling today. Kelly Fry is at risk for osteopenia and osteoporosis due to her vitamin D deficiency. She was encouraged to take her vitamin D and follow her higher calcium diet and increase strengthening exercise to help strengthen her bones and decrease her risk of osteopenia and osteoporosis.  Depression with Emotional Eating Behaviors We discussed behavior modification techniques today to help Kelly Fry deal with her emotional eating and depression. She has agreed to take Lexapro 10 mg qd #30 with no refills and follow up as directed.  Obesity Kelly Fry is currently in the action stage of change. As such, her goal is to continue with weight loss efforts She has agreed to follow the Category 2 plan Kelly Fry will walk for 30 minutes, 2 times per week for weight loss and overall health benefits. We discussed the following Behavioral Modification Strategies today: planning for success, no skipping  meals, increasing lean protein intake, decreasing simple carbohydrates and decrease liquid calories Kelly Fry will decrease soda to 1 to 2 cans per week. She may have diet soda.  Kelly Fry has agreed to follow up with our clinic in 2 to 3 weeks. She was informed of the importance of frequent follow up visits to maximize her success with intensive lifestyle modifications for her multiple health conditions.  ALLERGIES:  Allergies  Allergen Reactions  . Latuda [Lurasidone] Other (See Comments)    Shaky; worsened mood    MEDICATIONS: Current Outpatient Medications on File Prior to Visit  Medication Sig Dispense Refill  . albuterol (PROVENTIL HFA;VENTOLIN HFA) 108 (90 Base) MCG/ACT inhaler Inhale 2 puffs into the lungs every 6 (six) hours as needed for wheezing or shortness of breath. 1 Inhaler 2  . buPROPion (WELLBUTRIN SR) 150 MG 12 hr tablet Take 1 tablet (150 mg total) by mouth daily. 30 tablet 0  . hydrochlorothiazide (MICROZIDE) 12.5 MG capsule Take 1 capsule (12.5 mg total) by mouth daily. 90 capsule 1  . metFORMIN (GLUCOPHAGE) 500 MG tablet Take 1 tablet (500 mg total) by mouth every morning. 30 tablet 0  . naproxen (NAPROSYN) 500 MG tablet Take 1 tablet (500 mg total) by mouth 2 (two) times daily with a meal. 60 tablet 1   No current facility-administered medications on file prior to visit.     PAST MEDICAL HISTORY: Past Medical History:  Diagnosis Date  . Ankle instability, right   . Anxiety   . Back pain   . Bipolar disorder ([place])   . Complication of anesthesia   . Depression   . Dyspnea   . Family history of adverse reaction to anesthesia    Dad had n/v post anesthesia  . GERD (gastroesophageal reflux disease)   . Leg edema   . Medical history non-contributory   . Miscarriage   . PONV (postoperative nausea and vomiting)   . Vitamin D deficiency     PAST SURGICAL HISTORY: Past Surgical History:  Procedure Laterality Date  . ANKLE RECONSTRUCTION Right 02/21/2017   Procedure: RIGHT LATERAL ANKLE RECONSTRUCTION WITH SPLIT PERONEAL LONGUS TENDON;  Surgeon: Marybelle Killings, MD;  Location: [facility];  Service: Orthopedics;  Laterality: Right;  . NO PAST SURGERIES    . WISDOM TOOTH EXTRACTION  04/12/2015    SOCIAL HISTORY: Social History   Tobacco Use  . Smoking status: Never Smoker  . Smokeless tobacco: Never Used  Substance Use Topics  . Alcohol use: No  . Drug use: No     FAMILY HISTORY: Family History  Problem Relation Age of Onset  . Kidney disease Father   . COPD Father   . Hypertension Father   . Depression Father   . Sleep apnea Father   . Obesity Father   . Kidney disease Sister   . Asthma Sister   . Early death Mother   . Stroke Mother   . Heart disease Mother   . Hypertension Mother   . Alcoholism Mother   . Asthma Brother     ROS: Review of Systems  Constitutional: Positive for weight loss.  Gastrointestinal: Negative for nausea and vomiting.  Musculoskeletal:       Negative for muscle weakness  Psychiatric/Behavioral: Positive for depression. Negative for suicidal ideas. The patient is nervous/anxious.     PHYSICAL EXAM: Blood pressure 114/77, pulse 84, temperature 98.9 F (37.2 C), temperature source Oral, height 5\' 4"  (1.626 m), weight 273 lb (123.8 kg), SpO2 98 %. Body mass  index is 46.86 kg/m. Physical Exam Vitals signs reviewed.  Constitutional:      Appearance: Normal appearance. She is well-developed. She is obese.  Cardiovascular:     Rate and Rhythm: Normal rate.  Pulmonary:     Effort: Pulmonary effort is normal.  Musculoskeletal: Normal range of motion.  Skin:    General: Skin is warm and dry.  Neurological:     Mental Status: She is alert and oriented to person, place, and time.  Psychiatric:        Mood and Affect: Mood normal.        Behavior: Behavior normal.        Thought Content: Thought content does not include homicidal or suicidal ideation.     RECENT LABS AND TESTS: BMET    Component Value Date/Time   NA 139 09/08/2018 1422   K 3.8 09/08/2018 1422   CL 102 09/08/2018 1422   CO2 21 09/08/2018 1422   GLUCOSE 79 09/08/2018 1422   GLUCOSE 91 02/19/2017 0935   GLUCOSE 72 08/30/2013 1342   BUN 14 09/08/2018 1422   CREATININE 0.99 09/08/2018 1422   CREATININE 0.73 11/09/2013 1509   CALCIUM 9.4 09/08/2018 1422   GFRNONAA 76 09/08/2018 1422   GFRAA 88 09/08/2018 1422   Lab Results   Component Value Date   HGBA1C 4.9 09/08/2018   HGBA1C 5.1 03/05/2018   HGBA1C 5.0 12/02/2017   HGBA1C 5.2 09/02/2017   HGBA1C 5.0 04/18/2016   Lab Results  Component Value Date   INSULIN 20.0 09/08/2018   INSULIN 19.7 03/05/2018   INSULIN 20.3 12/02/2017   INSULIN 42.7 (H) 09/02/2017   CBC    Component Value Date/Time   WBC 7.3 09/02/2017 0942   WBC 7.6 02/19/2017 0935   RBC 5.18 09/02/2017 0942   RBC 4.95 02/19/2017 0935   HGB 15.3 09/02/2017 0942   HCT 45.5 09/02/2017 0942   PLT 247 02/19/2017 0935   PLT 240 04/18/2016 0832   MCV 88 09/02/2017 0942   MCH 29.5 09/02/2017 0942   MCH 28.5 02/19/2017 0935   MCHC 33.6 09/02/2017 0942   MCHC 32.6 02/19/2017 0935   RDW 13.3 09/02/2017 0942   LYMPHSABS 1.5 09/02/2017 0942   MONOABS 0.4 06/01/2013 0956   EOSABS 0.1 09/02/2017 0942   BASOSABS 0.0 09/02/2017 0942   Iron/TIBC/Ferritin/ %Sat    Component Value Date/Time   IRON 101 06/30/2015 1007   TIBC 360 06/30/2015 1007   FERRITIN 56 06/30/2015 1007   IRONPCTSAT 28 06/30/2015 1007   Lipid Panel     Component Value Date/Time   CHOL 148 09/08/2018 1422   TRIG 94 09/08/2018 1422   HDL 36 (L) 09/08/2018 1422   CHOLHDL 3.5 06/30/2015 1007   LDLCALC 93 09/08/2018 1422   LDLDIRECT 118 (H) 06/24/2017 1633   Hepatic Function Panel     Component Value Date/Time   PROT 7.2 09/08/2018 1422   ALBUMIN 4.3 09/08/2018 1422   AST 14 09/08/2018 1422   ALT 17 09/08/2018 1422   ALKPHOS 84 09/08/2018 1422   BILITOT 0.5 09/08/2018 1422      Component Value Date/Time   TSH 2.320 09/08/2018 1422   TSH 2.760 09/02/2017 0942   TSH 2.880 06/24/2017 1633     Ref. Range 09/08/2018 14:22  Vitamin D, 25-Hydroxy Latest Ref Range: 30.0 - 100.0 ng/mL 42.8    OBESITY BEHAVIORAL INTERVENTION VISIT  Today's visit was # 25  Starting weight: 293 lbs Starting date: 09/02/2017 Today's weight : 273 lbs  Today's date: 11/30/2018 Total lbs lost to date: 20    11/30/2018  Height 5\' 4"   (1.626 m)  Weight 273 lb (123.8 kg)  BMI (Calculated) 46.84  BLOOD PRESSURE - SYSTOLIC 99991111  BLOOD PRESSURE - DIASTOLIC 77   Body Fat % A999333 %  Total Body Water (lbs) 91.8 lbs    ASK: We discussed the diagnosis of obesity with [name] today and Millard agreed to give [place] permission to discuss obesity behavioral modification therapy today.  ASSESS: Rutha has the diagnosis of obesity and her BMI today is 46.84 Dayja is in the action stage of change   ADVISE: Keigan was educated on the multiple health risks of obesity as well as the benefit of weight loss to improve her health. She was advised of the need for long term treatment and the importance of lifestyle modifications to improve her current health and to decrease her risk of future health problems.  AGREE: Multiple dietary modification options and treatment options were discussed and  Payslee agreed to follow the recommendations documented in the above note.  ARRANGE: Khrysten was educated on the importance of frequent visits to treat obesity as outlined per CMS and USPSTF guidelines and agreed to schedule her next follow up appointment today.  Corey Skains, am acting as Location manager for [employer], FNP-C.  I have reviewed the above documentation for accuracy and completeness, and I agree with the above.  - Giavonni Fonder, FNP-C.

## 2018-12-03 ENCOUNTER — Encounter (INDEPENDENT_AMBULATORY_CARE_PROVIDER_SITE_OTHER): Payer: Self-pay | Admitting: Family Medicine

## 2018-12-21 ENCOUNTER — Other Ambulatory Visit: Payer: Self-pay

## 2018-12-21 ENCOUNTER — Encounter (INDEPENDENT_AMBULATORY_CARE_PROVIDER_SITE_OTHER): Payer: Self-pay | Admitting: Family Medicine

## 2018-12-21 ENCOUNTER — Ambulatory Visit (INDEPENDENT_AMBULATORY_CARE_PROVIDER_SITE_OTHER): Payer: Medicaid Other | Admitting: Family Medicine

## 2018-12-21 VITALS — BP 124/76 | HR 73 | Temp 98.8°F | Ht 64.0 in | Wt 277.0 lb

## 2018-12-21 DIAGNOSIS — E8881 Metabolic syndrome: Secondary | ICD-10-CM | POA: Diagnosis not present

## 2018-12-21 DIAGNOSIS — E88819 Insulin resistance, unspecified: Secondary | ICD-10-CM

## 2018-12-21 DIAGNOSIS — Z6841 Body Mass Index (BMI) 40.0 and over, adult: Secondary | ICD-10-CM

## 2018-12-21 DIAGNOSIS — E559 Vitamin D deficiency, unspecified: Secondary | ICD-10-CM

## 2018-12-21 DIAGNOSIS — Z9189 Other specified personal risk factors, not elsewhere classified: Secondary | ICD-10-CM

## 2018-12-21 MED ORDER — VITAMIN D (ERGOCALCIFEROL) 1.25 MG (50000 UNIT) PO CAPS: 50000.0000 [IU] | ORAL_CAPSULE | ORAL | 0 refills | Status: DC

## 2018-12-21 MED ORDER — METFORMIN HCL 500 MG PO TABS: 500.0000 mg | ORAL_TABLET | ORAL | 0 refills | Status: DC

## 2018-12-22 ENCOUNTER — Encounter (INDEPENDENT_AMBULATORY_CARE_PROVIDER_SITE_OTHER): Payer: Self-pay | Admitting: Family Medicine

## 2018-12-22 NOTE — Progress Notes (Signed)
Office: [phone]  /  Fax: [phone]   HPI:   Chief Complaint: OBESITY Kelly Fry is here to discuss her progress with her obesity treatment plan. She is on the Category 2 plan and is following her eating plan approximately 85 % of the time. She states she is walking 25 minutes 3 times per week. Kelly Fry is drinking 2 to 3 sodas per day again. She has deceased this to 2-3 per week. Her boyfriend keeps Pepsis which are her favorite. She is sticking to the food on the plan very well. She has done well with exercise. Her weight is 277 lb (125.6 kg) today and has had a weight gain of 4 pounds over a period of 3 weeks since her last visit. She has lost 16 lbs since starting treatment with [place].  Vitamin D deficiency Kelly Fry has a diagnosis of vitamin D deficiency. She is currently taking vit D. Her last vitamin D level was at 42.8 on 09/08/18 and was not at goal. She denies nausea, vomiting or muscle weakness.  Insulin Resistance Kelly Fry has a diagnosis of insulin resistance based on her elevated fasting insulin level >5. Although Kelly Fry's blood glucose readings are still under good control, insulin resistance puts her at greater risk of metabolic syndrome and diabetes. She feels metformin helps with polyphagia. She tolerates metformin well and she denies nausea or diarrhea. Kelly Fry continues to work on diet and exercise to decrease risk of diabetes. Kelly Fry denies polyphagia. Lab Results  Component Value Date   HGBA1C 4.9 09/08/2018    At risk for diabetes Kelly Fry is at higher than average risk for developing diabetes due to her obesity and insulin resistance. She currently denies polyuria or polydipsia.  ASSESSMENT AND PLAN:  Vitamin D deficiency - Plan: Vitamin D, Ergocalciferol, (DRISDOL) 1.25 MG (50000 UT) CAPS capsule  Insulin resistance - Plan: metFORMIN (GLUCOPHAGE) 500 MG tablet  At risk for diabetes mellitus  Class 3 severe obesity with serious comorbidity and body mass index (BMI) of  45.0 to 49.9 in adult, unspecified obesity type ([place])  PLAN:  Vitamin D Deficiency Kelly Fry was informed that low vitamin D levels contributes to fatigue and are associated with obesity, breast, and colon cancer. She agrees to continue to take prescription Vit D @50 ,000 IU every week #4 with no refills and she will follow up for routine testing of vitamin D, at least 2-3 times per year. She was informed of the risk of over-replacement of vitamin D and agrees to not increase her dose unless she discusses this with [place] first. Kelly Fry agrees to follow up as directed.  Insulin Resistance Kelly Fry will continue to work on weight loss, exercise, and decreasing simple carbohydrates in her diet to help decrease the risk of diabetes. Kelly Fry agrees to continue metformin 500 mg every morning #30 with no refills and follow up with [place] as directed to monitor her progress.  Diabetes risk counseling Kelly Fry was given extended (15 minutes) diabetes prevention counseling today. She is 33 y.o. female and has risk factors for diabetes including obesity and insulin resistance. We discussed intensive lifestyle modifications today with an emphasis on weight loss as well as increasing exercise and decreasing simple carbohydrates in her diet.  Obesity Kelly Fry is currently in the action stage of change. As such, her goal is to continue with weight loss efforts She has agreed to follow the Category 2 plan Kelly Fry will continue walking for 25 minutes, 3 times per week for weight loss and overall health benefits. We discussed the following  Behavioral Modification Strategies today: planning for success, holiday eating strategies and decrease liquid calories Handouts for holiday recipes were given to patient. We  made a plan to cut down on soda. She will decrease soda to one a day for one week and then one every other day.   Kelly Fry has agreed to follow up with our clinic in 3 weeks. She was informed of the importance of frequent  follow up visits to maximize her success with intensive lifestyle modifications for her multiple health conditions.  ALLERGIES: Allergies  Allergen Reactions  . Latuda [Lurasidone] Other (See Comments)    Shaky; worsened mood    MEDICATIONS: Current Outpatient Medications on File Prior to Visit  Medication Sig Dispense Refill  . albuterol (PROVENTIL HFA;VENTOLIN HFA) 108 (90 Base) MCG/ACT inhaler Inhale 2 puffs into the lungs every 6 (six) hours as needed for wheezing or shortness of breath. 1 Inhaler 2  . buPROPion (WELLBUTRIN SR) 150 MG 12 hr tablet Take 1 tablet (150 mg total) by mouth daily. 30 tablet 0  . escitalopram (LEXAPRO) 10 MG tablet Take 1 tablet (10 mg total) by mouth every morning. 30 tablet 0  . hydrochlorothiazide (MICROZIDE) 12.5 MG capsule Take 1 capsule (12.5 mg total) by mouth daily. 90 capsule 1  . naproxen (NAPROSYN) 500 MG tablet Take 1 tablet (500 mg total) by mouth 2 (two) times daily with a meal. 60 tablet 1   No current facility-administered medications on file prior to visit.     PAST MEDICAL HISTORY: Past Medical History:  Diagnosis Date  . Ankle instability, right   . Anxiety   . Back pain   . Bipolar disorder ([place])   . Complication of anesthesia   . Depression   . Dyspnea   . Family history of adverse reaction to anesthesia    Dad had n/v post anesthesia  . GERD (gastroesophageal reflux disease)   . Leg edema   . Medical history non-contributory   . Miscarriage   . PONV (postoperative nausea and vomiting)   . Vitamin D deficiency     PAST SURGICAL HISTORY: Past Surgical History:  Procedure Laterality Date  . ANKLE RECONSTRUCTION Right 02/21/2017   Procedure: RIGHT LATERAL ANKLE RECONSTRUCTION WITH SPLIT PERONEAL LONGUS TENDON;  Surgeon: Marybelle Killings, MD;  Location: [facility];  Service: Orthopedics;  Laterality: Right;  . NO PAST SURGERIES    . WISDOM TOOTH EXTRACTION  04/12/2015    SOCIAL HISTORY: Social History   Tobacco Use  .  Smoking status: Never Smoker  . Smokeless tobacco: Never Used  Substance Use Topics  . Alcohol use: No  . Drug use: No    FAMILY HISTORY: Family History  Problem Relation Age of Onset  . Kidney disease Father   . COPD Father   . Hypertension Father   . Depression Father   . Sleep apnea Father   . Obesity Father   . Kidney disease Sister   . Asthma Sister   . Early death Mother   . Stroke Mother   . Heart disease Mother   . Hypertension Mother   . Alcoholism Mother   . Asthma Brother     ROS: Review of Systems  Constitutional: Negative for weight loss.  Gastrointestinal: Negative for nausea and vomiting.  Genitourinary: Negative for frequency.  Musculoskeletal:       Negative for muscle weakness  Endo/Heme/Allergies: Negative for polydipsia.       Negative for polyphagia    PHYSICAL EXAM: Blood pressure 124/76,  pulse 73, temperature 98.8 F (37.1 C), temperature source Oral, height 5\' 4"  (1.626 m), weight 277 lb (125.6 kg), SpO2 98 %. Body mass index is 47.55 kg/m. Physical Exam Vitals signs reviewed.  Constitutional:      Appearance: Normal appearance. She is well-developed. She is obese.  Cardiovascular:     Rate and Rhythm: Normal rate.  Pulmonary:     Effort: Pulmonary effort is normal.  Musculoskeletal: Normal range of motion.  Skin:    General: Skin is warm and dry.  Neurological:     Mental Status: She is alert and oriented to person, place, and time.  Psychiatric:        Mood and Affect: Mood normal.        Behavior: Behavior normal.     RECENT LABS AND TESTS: BMET    Component Value Date/Time   NA 139 09/08/2018 1422   K 3.8 09/08/2018 1422   CL 102 09/08/2018 1422   CO2 21 09/08/2018 1422   GLUCOSE 79 09/08/2018 1422   GLUCOSE 91 02/19/2017 0935   GLUCOSE 72 08/30/2013 1342   BUN 14 09/08/2018 1422   CREATININE 0.99 09/08/2018 1422   CREATININE 0.73 11/09/2013 1509   CALCIUM 9.4 09/08/2018 1422   GFRNONAA 76 09/08/2018 1422   GFRAA  88 09/08/2018 1422   Lab Results  Component Value Date   HGBA1C 4.9 09/08/2018   HGBA1C 5.1 03/05/2018   HGBA1C 5.0 12/02/2017   HGBA1C 5.2 09/02/2017   HGBA1C 5.0 04/18/2016   Lab Results  Component Value Date   INSULIN 20.0 09/08/2018   INSULIN 19.7 03/05/2018   INSULIN 20.3 12/02/2017   INSULIN 42.7 (H) 09/02/2017   CBC    Component Value Date/Time   WBC 7.3 09/02/2017 0942   WBC 7.6 02/19/2017 0935   RBC 5.18 09/02/2017 0942   RBC 4.95 02/19/2017 0935   HGB 15.3 09/02/2017 0942   HCT 45.5 09/02/2017 0942   PLT 247 02/19/2017 0935   PLT 240 04/18/2016 0832   MCV 88 09/02/2017 0942   MCH 29.5 09/02/2017 0942   MCH 28.5 02/19/2017 0935   MCHC 33.6 09/02/2017 0942   MCHC 32.6 02/19/2017 0935   RDW 13.3 09/02/2017 0942   LYMPHSABS 1.5 09/02/2017 0942   MONOABS 0.4 06/01/2013 0956   EOSABS 0.1 09/02/2017 0942   BASOSABS 0.0 09/02/2017 0942   Iron/TIBC/Ferritin/ %Sat    Component Value Date/Time   IRON 101 06/30/2015 1007   TIBC 360 06/30/2015 1007   FERRITIN 56 06/30/2015 1007   IRONPCTSAT 28 06/30/2015 1007   Lipid Panel     Component Value Date/Time   CHOL 148 09/08/2018 1422   TRIG 94 09/08/2018 1422   HDL 36 (L) 09/08/2018 1422   CHOLHDL 3.5 06/30/2015 1007   LDLCALC 93 09/08/2018 1422   LDLDIRECT 118 (H) 06/24/2017 1633   Hepatic Function Panel     Component Value Date/Time   PROT 7.2 09/08/2018 1422   ALBUMIN 4.3 09/08/2018 1422   AST 14 09/08/2018 1422   ALT 17 09/08/2018 1422   ALKPHOS 84 09/08/2018 1422   BILITOT 0.5 09/08/2018 1422      Component Value Date/Time   TSH 2.320 09/08/2018 1422   TSH 2.760 09/02/2017 0942   TSH 2.880 06/24/2017 1633     Ref. Range 09/08/2018 14:22  Vitamin D, 25-Hydroxy Latest Ref Range: 30.0 - 100.0 ng/mL 42.8    OBESITY BEHAVIORAL INTERVENTION VISIT  Today's visit was # 26  Starting weight: 293 lbs Starting  date: 09/02/2017 Today's weight : 277 lbs Today's date: 12/21/2018 Total lbs lost to date:  16    12/21/2018  Height 5\' 4"  (1.626 m)  Weight 277 lb (125.6 kg)  BMI (Calculated) 47.52  BLOOD PRESSURE - SYSTOLIC [id number]  BLOOD PRESSURE - DIASTOLIC 76   Body Fat % [id number] %  Total Body Water (lbs) 95.6 lbs    ASK: We discussed the diagnosis of obesity with [name] today and Miriya agreed to give [place] permission to discuss obesity behavioral modification therapy today.  ASSESS: Tazanna has the diagnosis of obesity and her BMI today is 47.52 Hyun is in the action stage of change   ADVISE: Shatana was educated on the multiple health risks of obesity as well as the benefit of weight loss to improve her health. She was advised of the need for long term treatment and the importance of lifestyle modifications to improve her current health and to decrease her risk of future health problems.  AGREE: Multiple dietary modification options and treatment options were discussed and  Anajulia agreed to follow the recommendations documented in the above note.  ARRANGE: Andrianne was educated on the importance of frequent visits to treat obesity as outlined per CMS and USPSTF guidelines and agreed to schedule her next follow up appointment today.  Corey Skains, am acting as Location manager for [employer], FNP-C.  I have reviewed the above documentation for accuracy and completeness, and I agree with the above.  - Rohin Krejci, FNP-C.

## 2019-01-12 ENCOUNTER — Encounter (INDEPENDENT_AMBULATORY_CARE_PROVIDER_SITE_OTHER): Payer: Self-pay | Admitting: Family Medicine

## 2019-01-12 ENCOUNTER — Ambulatory Visit (INDEPENDENT_AMBULATORY_CARE_PROVIDER_SITE_OTHER): Payer: Medicaid Other | Admitting: Family Medicine

## 2019-01-12 ENCOUNTER — Other Ambulatory Visit: Payer: Self-pay

## 2019-01-12 VITALS — BP 124/82 | HR 67 | Temp 98.5°F | Ht 64.0 in | Wt 278.0 lb

## 2019-01-12 DIAGNOSIS — E559 Vitamin D deficiency, unspecified: Secondary | ICD-10-CM

## 2019-01-12 DIAGNOSIS — F418 Other specified anxiety disorders: Secondary | ICD-10-CM

## 2019-01-12 DIAGNOSIS — Z6841 Body Mass Index (BMI) 40.0 and over, adult: Secondary | ICD-10-CM

## 2019-01-12 DIAGNOSIS — E8881 Metabolic syndrome: Secondary | ICD-10-CM

## 2019-01-12 DIAGNOSIS — Z9189 Other specified personal risk factors, not elsewhere classified: Secondary | ICD-10-CM | POA: Diagnosis not present

## 2019-01-12 MED ORDER — VITAMIN D (ERGOCALCIFEROL) 1.25 MG (50000 UNIT) PO CAPS: 50000.0000 [IU] | ORAL_CAPSULE | ORAL | 0 refills | Status: DC

## 2019-01-12 MED ORDER — TOPIRAMATE 25 MG PO TABS: 25.0000 mg | ORAL_TABLET | Freq: Every day | ORAL | 0 refills | Status: DC

## 2019-01-12 NOTE — Progress Notes (Signed)
Office: [phone]  /  Fax: [phone]   HPI:   Chief Complaint: OBESITY Kelly Fry is here to discuss her progress with her obesity treatment plan. She is on the Category 2 plan and is following her eating plan approximately 60 % of the time. She states she is exercising 0 minutes 0 times per week. Kelly Fry is drinking 3 to 4 cans of Pepsi per day which she feels is her biggest issue. Her husband buys it because he drinks it. She is getting all of the food in at lunch and diner. She drinks about 3 bottles of water per day. Kelly Fry skips breakfast most days. Her weight is 278 lb (126.1 kg) today and has had a weight gain of 1 pound over a period of 3 weeks since her last visit. She has lost 15 lbs since starting treatment with [place].  Vitamin D deficiency Kelly Fry has a diagnosis of vitamin D deficiency. Her last vitamin D level was at 42.8 and was not at goal. Kelly Fry is currently taking vit D and she denies nausea, vomiting or muscle weakness.  At risk for osteopenia and osteoporosis Kelly Fry is at higher risk of osteopenia and osteoporosis due to vitamin D deficiency.   Insulin Resistance Kelly Fry has a diagnosis of insulin resistance based on her elevated fasting insulin level >5. Kelly Fry is on metformin and she denies nausea, vomiting or diarrhea. Kelly Fry denies polyphagia. Lab Results  Component Value Date   HGBA1C 5.1 01/12/2019    Depression with Anxiety Kelly Fry reports excessive cravings, especially for soda. She denies excessive snacking. Her mood is stable on Bupropion and Lexapro. She shows no sign of suicidal or homicidal ideations.  ASSESSMENT AND PLAN:  Vitamin D deficiency - Plan: Vitamin D (25 hydroxy), Vitamin D, Ergocalciferol, (DRISDOL) 1.25 MG (50000 UT) CAPS capsule  Depression with anxiety - Plan: topiramate (TOPAMAX) 25 MG tablet  Insulin resistance - Plan: HgB A1c, Insulin, random  At risk for osteoporosis  Class 3 severe obesity with serious comorbidity and body  mass index (BMI) of 45.0 to 49.9 in adult, unspecified obesity type ([place])  PLAN:  Vitamin D Deficiency Kelly Fry was informed that low vitamin D levels contributes to fatigue and are associated with obesity, breast, and colon cancer. Kelly Fry agrees to continue to take prescription Vit D @50 ,000 IU every week #4 with no refills and she will follow up for routine testing of vitamin D, at least 2-3 times per year. She was informed of the risk of over-replacement of vitamin D and agrees to not increase her dose unless she discusses this with [place] first. We will check vitamin D level today and Kelly Fry agrees to follow up as directed.  At risk for osteopenia and osteoporosis Kelly Fry was given extended (15 minutes) osteoporosis prevention counseling today. Kelly Fry is at risk for osteopenia and osteoporosis due to her vitamin D deficiency. She was encouraged to take her vitamin D and follow her higher calcium diet and increase strengthening exercise to help strengthen her bones and decrease her risk of osteopenia and osteoporosis.  Insulin Resistance Kelly Fry, exercise, and decreasing simple carbohydrates in her diet to help decrease the risk of diabetes. . We will check A1c and fasting insulin today and Kelly Fry agrees to follow up with [place] as directed to monitor her progress.  Depression with Anxiety She has agreed to start Topamax 25 mg qHS #30 with no refills and continue Lexapro and Bupropion. Hopefully this will help her break her soda  habit due to probable dysgeusia with carbonated beverages from Topamax.  Patient denies history of glaucoma or kidney stones. She was informed of side effects and that topiramate is teratogenic. She currently uses Nexplanon for birth control. Kelly Fry agrees to follow up with our clinic in 2 weeks.   Obesity Kelly Fry is currently in the action stage of change. As such, her goal is to continue with weight Fry efforts She has agreed to follow the  Category 2 plan Kelly Fry will do her best to add walking a few days per week for weight Fry and overall health benefits. We discussed the following Behavioral Modification Strategies today: planning for success and decrease liquid calories  Kelly Fry may have a premier shake instead of breakfast although I advised her that eating breakfast is the better option. Kelly Fry Kitchen She will decrease soda to 1 per day.  Kelly Fry has agreed to follow up with our clinic in 2 weeks. She was informed of the importance of frequent follow up visits to maximize her success with intensive lifestyle modifications for her multiple health conditions.  ALLERGIES: Allergies  Allergen Reactions  . Latuda [Lurasidone] Other (See Comments)    Shaky; worsened mood    MEDICATIONS: Current Outpatient Medications on File Prior to Visit  Medication Sig Dispense Refill  . albuterol (PROVENTIL HFA;VENTOLIN HFA) 108 (90 Base) MCG/ACT inhaler Inhale 2 puffs into the lungs every 6 (six) hours as needed for wheezing or shortness of breath. 1 Inhaler 2  . buPROPion (WELLBUTRIN SR) 150 MG 12 hr tablet Take 1 tablet (150 mg total) by mouth daily. 30 tablet 0  . escitalopram (LEXAPRO) 10 MG tablet Take 1 tablet (10 mg total) by mouth every morning. 30 tablet 0  . hydrochlorothiazide (MICROZIDE) 12.5 MG capsule Take 1 capsule (12.5 mg total) by mouth daily. 90 capsule 1  . metFORMIN (GLUCOPHAGE) 500 MG tablet Take 1 tablet (500 mg total) by mouth every morning. 30 tablet 0  . naproxen (NAPROSYN) 500 MG tablet Take 1 tablet (500 mg total) by mouth 2 (two) times daily with a meal. 60 tablet 1   No current facility-administered medications on file prior to visit.     PAST MEDICAL HISTORY: Past Medical History:  Diagnosis Date  . Ankle instability, right   . Anxiety   . Back pain   . Bipolar disorder ([place])   . Complication of anesthesia   . Depression   . Dyspnea   . Family history of adverse reaction to anesthesia    Dad had n/v post  anesthesia  . GERD (gastroesophageal reflux disease)   . Leg edema   . Medical history non-contributory   . Miscarriage   . PONV (postoperative nausea and vomiting)   . Vitamin D deficiency     PAST SURGICAL HISTORY: Past Surgical History:  Procedure Laterality Date  . ANKLE RECONSTRUCTION Right 02/21/2017   Procedure: RIGHT LATERAL ANKLE RECONSTRUCTION WITH SPLIT PERONEAL LONGUS TENDON;  Surgeon: Marybelle Killings, MD;  Location: [facility];  Service: Orthopedics;  Laterality: Right;  . NO PAST SURGERIES    . WISDOM TOOTH EXTRACTION  04/12/2015    SOCIAL HISTORY: Social History   Tobacco Use  . Smoking status: Never Smoker  . Smokeless tobacco: Never Used  Substance Use Topics  . Alcohol use: No  . Drug use: No    FAMILY HISTORY: Family History  Problem Relation Age of Onset  . Kidney disease Father   . COPD Father   . Hypertension Father   . Depression  Father   . Sleep apnea Father   . Obesity Father   . Kidney disease Sister   . Asthma Sister   . Early death Mother   . Stroke Mother   . Heart disease Mother   . Hypertension Mother   . Alcoholism Mother   . Asthma Brother     ROS: Review of Systems  Constitutional: Negative for weight Fry.  Gastrointestinal: Negative for diarrhea, nausea and vomiting.  Musculoskeletal:       Negative for muscle weakness  Endo/Heme/Allergies:       Negative for polyphagia  Psychiatric/Behavioral: Positive for depression. Negative for suicidal ideas.    PHYSICAL EXAM: Blood pressure 124/82, pulse 67, temperature 98.5 F (36.9 C), temperature source Oral, height 5\' 4"  (1.626 m), weight 278 lb (126.1 kg), SpO2 99 %. Body mass index is 47.72 kg/m. Physical Exam Vitals signs reviewed.  Constitutional:      Appearance: Normal appearance. She is well-developed. She is obese.  Cardiovascular:     Rate and Rhythm: Normal rate.  Pulmonary:     Effort: Pulmonary effort is normal.  Musculoskeletal: Normal range of motion.  Skin:     General: Skin is warm and dry.  Neurological:     Mental Status: She is alert and oriented to person, place, and time.  Psychiatric:        Mood and Affect: Mood normal.        Behavior: Behavior normal.        Thought Content: Thought content does not include homicidal or suicidal ideation.     RECENT LABS AND TESTS: BMET    Component Value Date/Time   NA 139 09/08/2018 1422   K 3.8 09/08/2018 1422   CL 102 09/08/2018 1422   CO2 21 09/08/2018 1422   GLUCOSE 79 09/08/2018 1422   GLUCOSE 91 02/19/2017 0935   GLUCOSE 72 08/30/2013 1342   BUN 14 09/08/2018 1422   CREATININE 0.99 09/08/2018 1422   CREATININE 0.73 11/09/2013 1509   CALCIUM 9.4 09/08/2018 1422   GFRNONAA 76 09/08/2018 1422   GFRAA 88 09/08/2018 1422   Lab Results  Component Value Date   HGBA1C 4.9 09/08/2018   HGBA1C 5.1 03/05/2018   HGBA1C 5.0 12/02/2017   HGBA1C 5.2 09/02/2017   HGBA1C 5.0 04/18/2016   Lab Results  Component Value Date   INSULIN 20.0 09/08/2018   INSULIN 19.7 03/05/2018   INSULIN 20.3 12/02/2017   INSULIN 42.7 (H) 09/02/2017   CBC    Component Value Date/Time   WBC 7.3 09/02/2017 0942   WBC 7.6 02/19/2017 0935   RBC 5.18 09/02/2017 0942   RBC 4.95 02/19/2017 0935   HGB 15.3 09/02/2017 0942   HCT 45.5 09/02/2017 0942   PLT 247 02/19/2017 0935   PLT 240 04/18/2016 0832   MCV 88 09/02/2017 0942   MCH 29.5 09/02/2017 0942   MCH 28.5 02/19/2017 0935   MCHC 33.6 09/02/2017 0942   MCHC 32.6 02/19/2017 0935   RDW 13.3 09/02/2017 0942   LYMPHSABS 1.5 09/02/2017 0942   MONOABS 0.4 06/01/2013 0956   EOSABS 0.1 09/02/2017 0942   BASOSABS 0.0 09/02/2017 0942   Iron/TIBC/Ferritin/ %Sat    Component Value Date/Time   IRON 101 06/30/2015 1007   TIBC 360 06/30/2015 1007   FERRITIN 56 06/30/2015 1007   IRONPCTSAT 28 06/30/2015 1007   Lipid Panel     Component Value Date/Time   CHOL 148 09/08/2018 1422   TRIG 94 09/08/2018 1422   HDL 36 (  L) 09/08/2018 1422   CHOLHDL 3.5  06/30/2015 1007   LDLCALC 93 09/08/2018 1422   LDLDIRECT 118 (H) 06/24/2017 1633   Hepatic Function Panel     Component Value Date/Time   PROT 7.2 09/08/2018 1422   ALBUMIN 4.3 09/08/2018 1422   AST 14 09/08/2018 1422   ALT 17 09/08/2018 1422   ALKPHOS 84 09/08/2018 1422   BILITOT 0.5 09/08/2018 1422      Component Value Date/Time   TSH 2.320 09/08/2018 1422   TSH 2.760 09/02/2017 0942   TSH 2.880 06/24/2017 1633      Results for TOSHI, WION (MRN [id number]) as of 01/12/2019 15:43  Ref. Range 09/08/2018 14:22  Vitamin D, 25-Hydroxy Latest Ref Range: 30.0 - 100.0 ng/mL 42.8   OBESITY BEHAVIORAL INTERVENTION VISIT  Today's visit was # 27  Starting weight: 293 lbs Starting date: 09/02/2017 Today's weight : 278 lbs Today's date: 01/12/2019 Total lbs lost to date: 15    01/12/2019  Height 5\' 4"  (1.626 m)  Weight 278 lb (126.1 kg)  BMI (Calculated) 47.7  BLOOD PRESSURE - SYSTOLIC [id number]  BLOOD PRESSURE - DIASTOLIC 82   Body Fat % [id number] %  Total Body Water (lbs) 94 lbs    ASK: We discussed the diagnosis of obesity with Kelly Fry today and Kelly Fry agreed to give [place] permission to discuss obesity behavioral modification therapy today.  ASSESS: Kelly Fry has the diagnosis of obesity and her BMI today is 28.7 Ahmyra is in the action stage of change   ADVISE: Kelly Fry was educated on the multiple health risks of obesity as well as the benefit of weight Fry to improve her health. She was advised of the need for long term treatment and the importance of lifestyle modifications to improve her current health and to decrease her risk of future health problems.  AGREE: Multiple dietary modification options and treatment options were discussed and  Aleeyah agreed to follow the recommendations documented in the above note.  ARRANGE: Kelly Fry was educated on the importance of frequent visits to treat obesity as outlined per CMS and USPSTF guidelines and agreed to schedule her next  follow up appointment today.  I, Doreene Nest, am acting as transcriptionist for [employer], FNP-C  I have reviewed the above documentation for accuracy and completeness, and I agree with the above.  - Brodric Schauer, FNP-C.

## 2019-01-13 ENCOUNTER — Encounter (INDEPENDENT_AMBULATORY_CARE_PROVIDER_SITE_OTHER): Payer: Self-pay | Admitting: Family Medicine

## 2019-01-13 LAB — HEMOGLOBIN A1C
Est. average glucose Bld gHb Est-mCnc: 100 mg/dL
Hgb A1c MFr Bld: 5.1 % (ref 4.8–5.6)

## 2019-01-13 LAB — SPECIMEN STATUS REPORT

## 2019-01-13 LAB — INSULIN, RANDOM: INSULIN: 5.1 u[IU]/mL (ref 2.6–24.9)

## 2019-01-13 LAB — VITAMIN D 25 HYDROXY (VIT D DEFICIENCY, FRACTURES): Vit D, 25-Hydroxy: 37.5 ng/mL (ref 30.0–100.0)

## 2019-01-27 ENCOUNTER — Encounter (INDEPENDENT_AMBULATORY_CARE_PROVIDER_SITE_OTHER): Payer: Self-pay | Admitting: Family Medicine

## 2019-01-27 ENCOUNTER — Telehealth (INDEPENDENT_AMBULATORY_CARE_PROVIDER_SITE_OTHER): Payer: Medicaid Other | Admitting: Family Medicine

## 2019-01-27 ENCOUNTER — Other Ambulatory Visit: Payer: Self-pay

## 2019-01-27 DIAGNOSIS — Z6841 Body Mass Index (BMI) 40.0 and over, adult: Secondary | ICD-10-CM | POA: Diagnosis not present

## 2019-01-27 DIAGNOSIS — E88819 Insulin resistance, unspecified: Secondary | ICD-10-CM

## 2019-01-27 DIAGNOSIS — E8881 Metabolic syndrome: Secondary | ICD-10-CM | POA: Diagnosis not present

## 2019-01-27 DIAGNOSIS — E559 Vitamin D deficiency, unspecified: Secondary | ICD-10-CM

## 2019-01-27 DIAGNOSIS — F418 Other specified anxiety disorders: Secondary | ICD-10-CM | POA: Diagnosis not present

## 2019-01-27 MED ORDER — VITAMIN D (ERGOCALCIFEROL) 1.25 MG (50000 UNIT) PO CAPS: 50000.0000 [IU] | ORAL_CAPSULE | ORAL | 0 refills | Status: DC

## 2019-01-27 MED ORDER — TOPIRAMATE 25 MG PO TABS: 25.0000 mg | ORAL_TABLET | Freq: Every day | ORAL | 0 refills | Status: DC

## 2019-01-27 MED ORDER — METFORMIN HCL 500 MG PO TABS: 500.0000 mg | ORAL_TABLET | ORAL | 0 refills | Status: DC

## 2019-01-27 MED ORDER — ESCITALOPRAM OXALATE 10 MG PO TABS: 10.0000 mg | ORAL_TABLET | ORAL | 0 refills | Status: DC

## 2019-02-01 NOTE — Progress Notes (Signed)
Office: [phone]  /  Fax: [phone] TeleHealth Visit:  Kelly Fry has verbally consented to this TeleHealth visit today. The patient is located at home, the provider is located at the [employer] [facility] office. The participants in this visit include the listed provider and patient and any and all parties involved. The visit was conducted today via telephone. Kelly Fry was unable to use realtime audiovisual technology today and the telehealth visit was conducted via telephone (time spent on call 9 minutes, 1:41-1:50).  HPI:  Chief Complaint: OBESITY Kelly Fry is here to discuss her progress with her obesity treatment plan. She is on the Category 2 plan and states she is following her eating plan approximately 60 % of the time. She states she is walking 20 minutes 3 to 4 times per week.  Kelly Fry has not had soda since 01/15/19. She has not had an appetite recently and she reports not eating all of her food. This may be due to the recent start of Topamax.  Vitamin D deficiency Kelly Fry has a diagnosis of vitamin D deficiency. Her last vitamin D level was at 37.5 on 01/12/19 and was not at goal.     Insulin Resistance Kelly Fry has a diagnosis of insulin resistance and she reports a lack of appetite over the last few weeks. She is on metformin once daily.   Other Depression with emotional eating behaviors Kelly Fry struggles with emotional eating. Topamax was started at the last visit to assist with cravings, especially for Pepsi. She reports, she has not had Pepsi since 01/15/19. Dysgeusia from Topamax has helped her stop. Her mood is stable.     ASSESSMENT AND PLAN:  Vitamin D deficiency - Plan: Vitamin D, Ergocalciferol, (DRISDOL) 1.25 MG (50000 UT) CAPS capsule  Depression with emotional eating - Plan: topiramate (TOPAMAX) 25 MG tablet, escitalopram (LEXAPRO) 10 MG tablet  Insulin resistance - Plan: metFORMIN (GLUCOPHAGE) 500 MG tablet  Class 3 severe obesity with serious  comorbidity and body mass index (BMI) of 45.0 to 49.9 in adult, unspecified obesity type (HCC)  PLAN:  Vitamin D Deficiency Low vitamin D level contributes to fatigue and are associated with obesity, breast, and colon cancer. Kelly Fry agrees to continue to take prescription Vit D @50 ,000 IU every week #4 with no refills and she will follow up for routine testing of vitamin D, at least 2-3 times per year to avoid over-replacement. Kelly Fry agrees to follow up with our clinic in 3 to 4 weeks.  Insulin Resistance Kelly Fry will continue to work on weight loss, exercise, and decreasing simple carbohydrates to help decrease the risk of diabetes. Kelly Fry agreed to continue metformin 500 mg qAM #30 with no refills and follow up with [place] as directed to closely monitor her progress.  Emotional Eating Behaviors (other depression) Behavior modification techniques were discussed today to help Kelly Fry deal with her emotional/non-hunger eating behaviors. Kelly Fry agrees to continue Lexapro 10 mg qAM #30 with no refills and Topamax 25 mg at bedtime #30 with no refills. Kelly Fry agrees to follow up as directed. We will continue to follow and monitor her progress. She is aware that topiramate is teratogenic. She currently uses Nexplanon for birth control.  Obesity Kelly Fry is currently in the action stage of change. As such, her goal is to continue with weight loss efforts She has agreed to follow the Category 2 plan Kelly Fry will continue walking for 20 minutes, 3 to 4 times per week for weight loss and overall health benefits. She will also explore exercise  videos on [employer]. We discussed the following Behavioral Modification Strategies today: planning for success, no skipping meals, travel eating strategies and increasing lean protein intake I encouraged her to eat all of the food on the plan.  Kelly Fry has agreed to follow up with our clinic in 3 to 4 weeks. She was informed of the importance of frequent follow up visits  to maximize her success with intensive lifestyle modifications for her multiple health conditions.  ALLERGIES: Allergies  Allergen Reactions  . Latuda [Lurasidone] Other (See Comments)    Shaky; worsened mood    MEDICATIONS: Current Outpatient Medications on File Prior to Visit  Medication Sig Dispense Refill  . albuterol (PROVENTIL HFA;VENTOLIN HFA) 108 (90 Base) MCG/ACT inhaler Inhale 2 puffs into the lungs every 6 (six) hours as needed for wheezing or shortness of breath. 1 Inhaler 2  . buPROPion (WELLBUTRIN SR) 150 MG 12 hr tablet Take 1 tablet (150 mg total) by mouth daily. 30 tablet 0  . hydrochlorothiazide (MICROZIDE) 12.5 MG capsule Take 1 capsule (12.5 mg total) by mouth daily. 90 capsule 1  . naproxen (NAPROSYN) 500 MG tablet Take 1 tablet (500 mg total) by mouth 2 (two) times daily with a meal. 60 tablet 1   No current facility-administered medications on file prior to visit.    PAST MEDICAL HISTORY: Past Medical History:  Diagnosis Date  . Ankle instability, right   . Anxiety   . Back pain   . Bipolar disorder ([place])   . Complication of anesthesia   . Depression   . Dyspnea   . Family history of adverse reaction to anesthesia    Dad had n/v post anesthesia  . GERD (gastroesophageal reflux disease)   . Leg edema   . Medical history non-contributory   . Miscarriage   . PONV (postoperative nausea and vomiting)   . Vitamin D deficiency     PAST SURGICAL HISTORY: Past Surgical History:  Procedure Laterality Date  . ANKLE RECONSTRUCTION Right 02/21/2017   Procedure: RIGHT LATERAL ANKLE RECONSTRUCTION WITH SPLIT PERONEAL LONGUS TENDON;  Surgeon: Marybelle Killings, MD;  Location: [facility];  Service: Orthopedics;  Laterality: Right;  . NO PAST SURGERIES    . WISDOM TOOTH EXTRACTION  04/12/2015    SOCIAL HISTORY: Social History   Tobacco Use  . Smoking status: Never Smoker  . Smokeless tobacco: Never Used  Substance Use Topics  . Alcohol use: No  . Drug use: No     FAMILY HISTORY: Family History  Problem Relation Age of Onset  . Kidney disease Father   . COPD Father   . Hypertension Father   . Depression Father   . Sleep apnea Father   . Obesity Father   . Kidney disease Sister   . Asthma Sister   . Early death Mother   . Stroke Mother   . Heart disease Mother   . Hypertension Mother   . Alcoholism Mother   . Asthma Brother     ROS: Review of Systems  Constitutional: Negative for weight loss.  Endo/Heme/Allergies:       Negative for polyphagia  Psychiatric/Behavioral: Positive for depression.    PHYSICAL EXAM: There were no vitals taken for this visit. There is no height or weight on file to calculate BMI. Physical Exam Vitals reviewed.  Constitutional:      General: She is not in acute distress.    Appearance: Normal appearance. She is well-developed. She is obese.  Cardiovascular:     Rate  and Rhythm: Normal rate.  Pulmonary:     Effort: Pulmonary effort is normal.  Musculoskeletal:        General: Normal range of motion.  Skin:    General: Skin is warm and dry.  Neurological:     Mental Status: She is alert and oriented to person, place, and time.  Psychiatric:        Mood and Affect: Mood normal.        Behavior: Behavior normal.     RECENT LABS AND TESTS: BMET    Component Value Date/Time   NA 139 09/08/2018 1422   K 3.8 09/08/2018 1422   CL 102 09/08/2018 1422   CO2 21 09/08/2018 1422   GLUCOSE 79 09/08/2018 1422   GLUCOSE 91 02/19/2017 0935   GLUCOSE 72 08/30/2013 1342   BUN 14 09/08/2018 1422   CREATININE 0.99 09/08/2018 1422   CREATININE 0.73 11/09/2013 1509   CALCIUM 9.4 09/08/2018 1422   GFRNONAA 76 09/08/2018 1422   GFRAA 88 09/08/2018 1422   Lab Results  Component Value Date   HGBA1C 5.1 01/12/2019   HGBA1C 4.9 09/08/2018   HGBA1C 5.1 03/05/2018   HGBA1C 5.0 12/02/2017   HGBA1C 5.2 09/02/2017   Lab Results  Component Value Date   INSULIN 5.1 01/12/2019   INSULIN 20.0 09/08/2018    INSULIN 19.7 03/05/2018   INSULIN 20.3 12/02/2017   INSULIN 42.7 (H) 09/02/2017   CBC    Component Value Date/Time   WBC 7.3 09/02/2017 0942   WBC 7.6 02/19/2017 0935   RBC 5.18 09/02/2017 0942   RBC 4.95 02/19/2017 0935   HGB 15.3 09/02/2017 0942   HCT 45.5 09/02/2017 0942   PLT 247 02/19/2017 0935   PLT 240 04/18/2016 0832   MCV 88 09/02/2017 0942   MCH 29.5 09/02/2017 0942   MCH 28.5 02/19/2017 0935   MCHC 33.6 09/02/2017 0942   MCHC 32.6 02/19/2017 0935   RDW 13.3 09/02/2017 0942   LYMPHSABS 1.5 09/02/2017 0942   MONOABS 0.4 06/01/2013 0956   EOSABS 0.1 09/02/2017 0942   BASOSABS 0.0 09/02/2017 0942   Iron/TIBC/Ferritin/ %Sat    Component Value Date/Time   IRON 101 06/30/2015 1007   TIBC 360 06/30/2015 1007   FERRITIN 56 06/30/2015 1007   IRONPCTSAT 28 06/30/2015 1007   Lipid Panel     Component Value Date/Time   CHOL 148 09/08/2018 1422   TRIG 94 09/08/2018 1422   HDL 36 (L) 09/08/2018 1422   CHOLHDL 3.5 06/30/2015 1007   LDLCALC 93 09/08/2018 1422   LDLDIRECT 118 (H) 06/24/2017 1633   Hepatic Function Panel     Component Value Date/Time   PROT 7.2 09/08/2018 1422   ALBUMIN 4.3 09/08/2018 1422   AST 14 09/08/2018 1422   ALT 17 09/08/2018 1422   ALKPHOS 84 09/08/2018 1422   BILITOT 0.5 09/08/2018 1422      Component Value Date/Time   TSH 2.320 09/08/2018 1422   TSH 2.760 09/02/2017 0942   TSH 2.880 06/24/2017 1633     Ref. Range 01/12/2019 00:00  Vitamin D, 25-Hydroxy Latest Ref Range: 30.0 - 100.0 ng/mL 37.5    I, Doreene Nest, am acting as Location manager for [employer], FNP-C  I have reviewed the above documentation for accuracy and completeness, and I agree with the above.  - Quilla Freeze, FNP-C.

## 2019-02-22 ENCOUNTER — Encounter (INDEPENDENT_AMBULATORY_CARE_PROVIDER_SITE_OTHER): Payer: Self-pay | Admitting: Family Medicine

## 2019-02-22 ENCOUNTER — Other Ambulatory Visit: Payer: Self-pay

## 2019-02-22 ENCOUNTER — Ambulatory Visit (INDEPENDENT_AMBULATORY_CARE_PROVIDER_SITE_OTHER): Payer: Medicaid Other | Admitting: Family Medicine

## 2019-02-22 VITALS — BP 120/76 | HR 71 | Temp 98.8°F | Ht 64.0 in | Wt 274.0 lb

## 2019-02-22 DIAGNOSIS — F3289 Other specified depressive episodes: Secondary | ICD-10-CM

## 2019-02-22 DIAGNOSIS — E8881 Metabolic syndrome: Secondary | ICD-10-CM

## 2019-02-22 DIAGNOSIS — Z6841 Body Mass Index (BMI) 40.0 and over, adult: Secondary | ICD-10-CM | POA: Diagnosis not present

## 2019-02-22 DIAGNOSIS — E559 Vitamin D deficiency, unspecified: Secondary | ICD-10-CM

## 2019-02-22 DIAGNOSIS — Z9189 Other specified personal risk factors, not elsewhere classified: Secondary | ICD-10-CM

## 2019-02-22 MED ORDER — METFORMIN HCL 500 MG PO TABS: 500.0000 mg | ORAL_TABLET | ORAL | 0 refills | Status: DC

## 2019-02-22 MED ORDER — VITAMIN D (ERGOCALCIFEROL) 1.25 MG (50000 UNIT) PO CAPS: 50000.0000 [IU] | ORAL_CAPSULE | ORAL | 0 refills | Status: DC

## 2019-02-24 NOTE — Progress Notes (Signed)
Chief Complaint:   OBESITY Kelly Fry is here to discuss her progress with her obesity treatment plan along with follow-up of her obesity related diagnoses. Kelly Fry is on the Category 2 Plan and states she is following her eating plan approximately 85% of the time. Kelly Fry states she is exercising 0 minutes 0 times per week.  Today's visit was #: 69 Starting weight: 293 lbs Starting date: 09/02/2017 Today's weight: 274 lbs Today's date: 02/22/2019 Total lbs lost to date: 19 Total lbs lost since last in-office visit: 4  Interim History: Kelly Fry has stopped skipping meals and she has not had any sodas. She was previously drinking one or more reg soda per day.She is doing quite well on the plan. Kelly Fry plans on starting walking today with her sister.  Subjective:   Insulin resistance Kelly Fry is on metformin. She denies polyphagia.  Vitamin D deficiency  Kelly Fry is on prescription vitamin D. Her last vitamin D level was 37.5 (01/12/19) and was not at goal.  Other depression, with emotional eating Kelly Fry denies stress eating. She would like to decrease her number of pills she takes so she would like to Topamax. She reports her mood is stable on Bupropion and Lexapro.  At risk for constipation Kelly Fry is at increased risk for constipation due to inadequate water intake, changes in diet, and/or use of medications such as GLP1 agonists. Kelly Fry denies hard, infrequent stools currently.   Assessment/Plan:   Insulin resistance Kelly Fry will continue to work on weight loss, exercise, and decreasing simple carbohydrates to help decrease the risk of diabetes. Kelly Fry agreed to continue metformin 500 mg daily every morning #30 with no refills and follow-up with [place] as directed to closely monitor her progress.  Vitamin D deficiency  Low Vitamin D level contributes to fatigue and are associated with obesity, breast, and colon cancer. Kelly Fry agrees to continue to take prescription Vitamin D @50 ,000  IU every week #4 with no refills and she will follow-up for routine testing of vitamin D, at least 2-3 times per year to avoid over-replacement.  Other depression, with emotional eating Behavior modification techniques were discussed today to help Kelly Fry deal with her emotional/non-hunger eating behaviors. Kelly Fry agrees to discontinue Topamax and continue Lexapro and Bupropion. Orders and follow up as documented in patient record.   At risk for constipation Kelly Fry was given approximately 15 minutes of counseling today regarding prevention of constipation. She was encouraged to increase water and fiber intake.   Obesity Kelly Fry is currently in the action stage of change. As such, her goal is to continue with weight loss efforts. She has agreed to on the Category 2 Plan.   Kelly Fry will walk for 30 minutes, 2 to 3 times per week.  We discussed the following behavioral modification strategies today: planning for success.  Kelly Fry has agreed to follow-up with our clinic in 2 weeks. She was informed of the importance of frequent follow-up visits to maximize her success with intensive lifestyle modifications for her multiple health conditions.   Objective:   Blood pressure 120/76, pulse 71, temperature 98.8 F (37.1 C), temperature source Oral, height 5\' 4"  (1.626 m), weight 274 lb (124.3 kg), SpO2 99 %. Body mass index is 47.03 kg/m.  General: Cooperative, alert, well developed, in no acute distress. HEENT: Conjunctivae and lids unremarkable. Neck: No thyromegaly.  Cardiovascular: Regular rhythm.  Lungs: Normal work of breathing. Extremities: No edema.  Neurologic: No focal deficits.   Lab Results  Component Value Date  CREATININE 0.99 09/08/2018   BUN 14 09/08/2018   NA 139 09/08/2018   K 3.8 09/08/2018   CL 102 09/08/2018   CO2 21 09/08/2018   Lab Results  Component Value Date   ALT 17 09/08/2018   AST 14 09/08/2018   ALKPHOS 84 09/08/2018   BILITOT 0.5 09/08/2018    Lab Results  Component Value Date   HGBA1C 5.1 01/12/2019   HGBA1C 4.9 09/08/2018   HGBA1C 5.1 03/05/2018   HGBA1C 5.0 12/02/2017   HGBA1C 5.2 09/02/2017   Lab Results  Component Value Date   INSULIN 5.1 01/12/2019   INSULIN 20.0 09/08/2018   INSULIN 19.7 03/05/2018   INSULIN 20.3 12/02/2017   INSULIN 42.7 (H) 09/02/2017   Lab Results  Component Value Date   TSH 2.320 09/08/2018   Lab Results  Component Value Date   CHOL 148 09/08/2018   HDL 36 (L) 09/08/2018   LDLCALC 93 09/08/2018   LDLDIRECT 118 (H) 06/24/2017   TRIG 94 09/08/2018   CHOLHDL 3.5 06/30/2015   Lab Results  Component Value Date   WBC 7.3 09/02/2017   HGB 15.3 09/02/2017   HCT 45.5 09/02/2017   MCV 88 09/02/2017   PLT 247 02/19/2017   Lab Results  Component Value Date   IRON 101 06/30/2015   TIBC 360 06/30/2015   FERRITIN 56 06/30/2015    Ref. Range 01/12/2019 00:00  Vitamin D, 25-Hydroxy Latest Ref Range: 30.0 - 100.0 ng/mL 37.5    Attestation Statements:   Reviewed by clinician on day of visit: allergies, medications, problem list, medical history, surgical history, family history, social history, and previous encounter notes.  Corey Skains, am acting as Location manager for [employer], FNP-C.  I have reviewed the above documentation for accuracy and completeness, and I agree with the above. -  Georgianne Fick, FNP

## 2019-02-25 ENCOUNTER — Encounter (INDEPENDENT_AMBULATORY_CARE_PROVIDER_SITE_OTHER): Payer: Self-pay | Admitting: Family Medicine

## 2019-03-03 ENCOUNTER — Other Ambulatory Visit: Payer: Self-pay | Admitting: Family Medicine

## 2019-03-03 DIAGNOSIS — M25473 Effusion, unspecified ankle: Secondary | ICD-10-CM

## 2019-03-04 ENCOUNTER — Encounter (INDEPENDENT_AMBULATORY_CARE_PROVIDER_SITE_OTHER): Payer: Self-pay | Admitting: Family Medicine

## 2019-03-04 ENCOUNTER — Other Ambulatory Visit: Payer: Self-pay

## 2019-03-04 ENCOUNTER — Ambulatory Visit (INDEPENDENT_AMBULATORY_CARE_PROVIDER_SITE_OTHER): Payer: Medicaid Other | Admitting: Family Medicine

## 2019-03-04 VITALS — BP 115/76 | HR 68 | Temp 98.5°F | Ht 64.0 in | Wt 277.0 lb

## 2019-03-04 DIAGNOSIS — F3289 Other specified depressive episodes: Secondary | ICD-10-CM

## 2019-03-04 DIAGNOSIS — Z6841 Body Mass Index (BMI) 40.0 and over, adult: Secondary | ICD-10-CM | POA: Diagnosis not present

## 2019-03-04 NOTE — Progress Notes (Signed)
Chief Complaint:   OBESITY Kelly Fry is here to discuss her progress with her obesity treatment plan along with follow-up of her obesity related diagnoses. Kelly Fry is on the Category 2 Plan and states she is following her eating plan approximately 85% of the time. Kelly Fry states she is walking for 15-20 minutes 2 times per week.  Today's visit was #: 30 Starting weight: 293 lbs Starting date: 09/02/2017 Today's weight: 277 lbs Today's date: 03/04/2019 Total lbs lost to date: 16 lbs Total lbs lost since last in-office visit: 0  Interim History: Kelly Fry has not been able to walk due to foot pain.  She is still not drinking any soda.  She is not eating all of the protein on plan.  She is not skipping meals.  She is up more than 2 pounds water weight.  She does not eat all of the prescribed protein at breakfast or dinner.  - Subjective:   1. Other depression, with emotional eating She denies emotionsal eating.  Taking bupropion which seems to be helping with emotional eating.  No suicidal ideation.  Assessment/Plan:   1. Other depression, with emotional eating Continue bupropion.  2. Class 3 severe obesity with serious comorbidity and body mass index (BMI) of 45.0 to 49.9 in adult, unspecified obesity type (HCC) Kelly Fry is currently in the action stage of change. As such, her goal is to continue with weight loss efforts. She has agreed to the Category 2 Plan.   Discussed reducing amount of meat at dinner to 4 ounces and add 1 cup of [employer] milk.  Add breakfast options.  Exercise goals:  Increase walking as tolerated.  Behavioral modification strategies: increasing lean protein intake, no skipping meals and planning for success.  Kelly Fry has agreed to follow-up with our clinic in 2 weeks. She was informed of the importance of frequent follow-up visits to maximize her success with intensive lifestyle modifications for her multiple health conditions.   Objective:   Blood pressure  115/76, pulse 68, temperature 98.5 F (36.9 C), temperature source Oral, height 5\' 4"  (1.626 m), weight 277 lb (125.6 kg), SpO2 98 %. Body mass index is 47.55 kg/m.  General: Cooperative, alert, well developed, in no acute distress. HEENT: Conjunctivae and lids unremarkable. Cardiovascular: Regular rhythm.  Lungs: Normal work of breathing. Neurologic: No focal deficits.   Lab Results  Component Value Date   CREATININE 0.99 09/08/2018   BUN 14 09/08/2018   NA 139 09/08/2018   K 3.8 09/08/2018   CL 102 09/08/2018   CO2 21 09/08/2018   Lab Results  Component Value Date   ALT 17 09/08/2018   AST 14 09/08/2018   ALKPHOS 84 09/08/2018   BILITOT 0.5 09/08/2018   Lab Results  Component Value Date   HGBA1C 5.1 01/12/2019   HGBA1C 4.9 09/08/2018   HGBA1C 5.1 03/05/2018   HGBA1C 5.0 12/02/2017   HGBA1C 5.2 09/02/2017   Lab Results  Component Value Date   INSULIN 5.1 01/12/2019   INSULIN 20.0 09/08/2018   INSULIN 19.7 03/05/2018   INSULIN 20.3 12/02/2017   INSULIN 42.7 (H) 09/02/2017   Lab Results  Component Value Date   TSH 2.320 09/08/2018   Lab Results  Component Value Date   CHOL 148 09/08/2018   HDL 36 (L) 09/08/2018   LDLCALC 93 09/08/2018   LDLDIRECT 118 (H) 06/24/2017   TRIG 94 09/08/2018   CHOLHDL 3.5 06/30/2015   Lab Results  Component Value Date   WBC 7.3 09/02/2017  HGB 15.3 09/02/2017   HCT 45.5 09/02/2017   MCV 88 09/02/2017   PLT 247 02/19/2017   Lab Results  Component Value Date   IRON 101 06/30/2015   TIBC 360 06/30/2015   FERRITIN 56 06/30/2015   Attestation Statements:   Reviewed by clinician on day of visit: allergies, medications, problem list, medical history, surgical history, family history, social history, and previous encounter notes.  I, Water quality scientist, CMA, am acting as Location manager for [employer], FNP-C.  I have reviewed the above documentation for accuracy and completeness, and I agree with the above. -  Georgianne Fick, FNP

## 2019-03-04 NOTE — Telephone Encounter (Signed)
Ok to fill or does pt ntbs with you? You last saw her 04/24/18 for flank pain.

## 2019-03-05 ENCOUNTER — Other Ambulatory Visit (INDEPENDENT_AMBULATORY_CARE_PROVIDER_SITE_OTHER): Payer: Self-pay | Admitting: Family Medicine

## 2019-03-05 ENCOUNTER — Encounter (INDEPENDENT_AMBULATORY_CARE_PROVIDER_SITE_OTHER): Payer: Self-pay | Admitting: Family Medicine

## 2019-03-05 DIAGNOSIS — F418 Other specified anxiety disorders: Secondary | ICD-10-CM

## 2019-03-08 ENCOUNTER — Encounter (INDEPENDENT_AMBULATORY_CARE_PROVIDER_SITE_OTHER): Payer: Self-pay

## 2019-03-08 ENCOUNTER — Telehealth (INDEPENDENT_AMBULATORY_CARE_PROVIDER_SITE_OTHER): Payer: Self-pay | Admitting: Family Medicine

## 2019-03-08 ENCOUNTER — Other Ambulatory Visit (INDEPENDENT_AMBULATORY_CARE_PROVIDER_SITE_OTHER): Payer: Self-pay | Admitting: Family Medicine

## 2019-03-08 DIAGNOSIS — F418 Other specified anxiety disorders: Secondary | ICD-10-CM

## 2019-03-08 NOTE — Telephone Encounter (Signed)
I will notify the patient of the status of the medications.

## 2019-03-08 NOTE — Telephone Encounter (Signed)
Patient is requesting refills for Lexapro (Esditalopram) and Metformin.  [facility] in [place] is telling her they are being denied.  She went through Rx request in My Chart and got the same denial message.  Thank you

## 2019-03-18 ENCOUNTER — Other Ambulatory Visit: Payer: Self-pay

## 2019-03-18 ENCOUNTER — Encounter (INDEPENDENT_AMBULATORY_CARE_PROVIDER_SITE_OTHER): Payer: Self-pay | Admitting: Family Medicine

## 2019-03-18 ENCOUNTER — Ambulatory Visit (INDEPENDENT_AMBULATORY_CARE_PROVIDER_SITE_OTHER): Payer: Medicaid Other | Admitting: Family Medicine

## 2019-03-18 VITALS — BP 111/77 | HR 69 | Temp 98.1°F | Ht 64.0 in | Wt 275.0 lb

## 2019-03-18 DIAGNOSIS — Z6841 Body Mass Index (BMI) 40.0 and over, adult: Secondary | ICD-10-CM

## 2019-03-18 DIAGNOSIS — F3289 Other specified depressive episodes: Secondary | ICD-10-CM | POA: Diagnosis not present

## 2019-03-22 ENCOUNTER — Encounter (INDEPENDENT_AMBULATORY_CARE_PROVIDER_SITE_OTHER): Payer: Self-pay | Admitting: Family Medicine

## 2019-03-22 NOTE — Progress Notes (Signed)
Chief Complaint:   OBESITY Kelly Fry is here to discuss her progress with her obesity treatment plan along with follow-up of her obesity related diagnoses. Kelly Fry is on the Category 2 Plan and states she is following her eating plan approximately 50% of the time. Kelly Fry states she is doing 0 minutes 0 times per week.  Today's visit was #: 23 Starting weight: 293 lbs Starting date: 09/02/17 Today's weight: 275 lbs Today's date: 03/18/2019 Total lbs lost to date: 18 Total lbs lost since last in-office visit: 2  Interim History: Kelly Fry does not have a working refrigerator at this time which is making sticking tot he plan more difficult. It will be fixed this weekend. She is doing better with her protein intake.She is still abstaining from soda.  Subjective:   1. Other depression, with emotional eating Kelly Fry feels her mood is stable on lexapro and bupropion. She denies emotional eating currently.  Assessment/Plan:   1. Other depression, with emotional eating Behavior modification techniques were discussed today to help Kelly Fry deal with her emotional/non-hunger eating behaviors. Tiffony agreed to continue bupropion and Lexapro. Orders and follow up as documented in patient record.   2. Class 3 severe obesity with serious comorbidity and body mass index (BMI) of 45.0 to 49.9 in adult, unspecified obesity type (HCC) Kelly Fry is currently in the action stage of change. As such, her goal is to continue with weight loss efforts. She has agreed to the Category 2 Plan.   Handouts were given: Dining Out.  Exercise goals: Kelly Fry is to start exercising for 20 minutes 2 times per week.  Behavioral modification strategies: increasing lean protein intake and planning for success.  Kelly Fry has agreed to follow-up with our clinic in 2 weeks. She was informed of the importance of frequent follow-up visits to maximize her success with intensive lifestyle modifications for her multiple health conditions.     Objective:   Blood pressure 111/77, pulse 69, temperature 98.1 F (36.7 C), temperature source Oral, height 5\' 4"  (1.626 m), weight 275 lb (124.7 kg), SpO2 98 %. Body mass index is 47.2 kg/m.  General: Cooperative, alert, well developed, in no acute distress. HEENT: Conjunctivae and lids unremarkable. Cardiovascular: Regular rhythm.  Lungs: Normal work of breathing. Neurologic: No focal deficits.   Lab Results  Component Value Date   CREATININE 0.99 09/08/2018   BUN 14 09/08/2018   NA 139 09/08/2018   K 3.8 09/08/2018   CL 102 09/08/2018   CO2 21 09/08/2018   Lab Results  Component Value Date   ALT 17 09/08/2018   AST 14 09/08/2018   ALKPHOS 84 09/08/2018   BILITOT 0.5 09/08/2018   Lab Results  Component Value Date   HGBA1C 5.1 01/12/2019   HGBA1C 4.9 09/08/2018   HGBA1C 5.1 03/05/2018   HGBA1C 5.0 12/02/2017   HGBA1C 5.2 09/02/2017   Lab Results  Component Value Date   INSULIN 5.1 01/12/2019   INSULIN 20.0 09/08/2018   INSULIN 19.7 03/05/2018   INSULIN 20.3 12/02/2017   INSULIN 42.7 (H) 09/02/2017   Lab Results  Component Value Date   TSH 2.320 09/08/2018   Lab Results  Component Value Date   CHOL 148 09/08/2018   HDL 36 (L) 09/08/2018   LDLCALC 93 09/08/2018   LDLDIRECT 118 (H) 06/24/2017   TRIG 94 09/08/2018   CHOLHDL 3.5 06/30/2015   Lab Results  Component Value Date   WBC 7.3 09/02/2017   HGB 15.3 09/02/2017   HCT 45.5 09/02/2017  MCV 88 09/02/2017   PLT 247 02/19/2017   Lab Results  Component Value Date   IRON 101 06/30/2015   TIBC 360 06/30/2015   FERRITIN 56 06/30/2015   Attestation Statements:   Reviewed by clinician on day of visit: allergies, medications, problem list, medical history, surgical history, family history, social history, and previous encounter notes.   Wilhemena Durie, am acting as Location manager for [employer], FNP-C.  I have reviewed the above documentation for accuracy and completeness, and I agree  with the above. -  Georgianne Fick, FNP

## 2019-03-31 ENCOUNTER — Encounter (INDEPENDENT_AMBULATORY_CARE_PROVIDER_SITE_OTHER): Payer: Self-pay | Admitting: Family Medicine

## 2019-03-31 DIAGNOSIS — F418 Other specified anxiety disorders: Secondary | ICD-10-CM

## 2019-03-31 DIAGNOSIS — E559 Vitamin D deficiency, unspecified: Secondary | ICD-10-CM

## 2019-03-31 MED ORDER — VITAMIN D (ERGOCALCIFEROL) 1.25 MG (50000 UNIT) PO CAPS: 50000.0000 [IU] | ORAL_CAPSULE | ORAL | 0 refills | Status: DC

## 2019-03-31 MED ORDER — ESCITALOPRAM OXALATE 10 MG PO TABS: ORAL_TABLET | ORAL | 0 refills | Status: DC

## 2019-04-01 ENCOUNTER — Ambulatory Visit (INDEPENDENT_AMBULATORY_CARE_PROVIDER_SITE_OTHER): Payer: Medicaid Other | Admitting: Family Medicine

## 2019-04-05 ENCOUNTER — Encounter (INDEPENDENT_AMBULATORY_CARE_PROVIDER_SITE_OTHER): Payer: Self-pay | Admitting: Family Medicine

## 2019-04-05 ENCOUNTER — Other Ambulatory Visit: Payer: Self-pay

## 2019-04-05 ENCOUNTER — Ambulatory Visit (INDEPENDENT_AMBULATORY_CARE_PROVIDER_SITE_OTHER): Payer: Medicaid Other | Admitting: Family Medicine

## 2019-04-05 VITALS — BP 123/74 | HR 60 | Temp 98.0°F | Ht 64.0 in | Wt 280.0 lb

## 2019-04-05 DIAGNOSIS — Z6841 Body Mass Index (BMI) 40.0 and over, adult: Secondary | ICD-10-CM

## 2019-04-05 DIAGNOSIS — E8881 Metabolic syndrome: Secondary | ICD-10-CM

## 2019-04-05 DIAGNOSIS — K219 Gastro-esophageal reflux disease without esophagitis: Secondary | ICD-10-CM | POA: Diagnosis not present

## 2019-04-05 DIAGNOSIS — F3289 Other specified depressive episodes: Secondary | ICD-10-CM | POA: Diagnosis not present

## 2019-04-05 MED ORDER — OMEPRAZOLE 20 MG PO CPDR: 20.0000 mg | DELAYED_RELEASE_CAPSULE | Freq: Every day | ORAL | 0 refills | Status: DC

## 2019-04-05 MED ORDER — METFORMIN HCL 500 MG PO TABS: 500.0000 mg | ORAL_TABLET | ORAL | 0 refills | Status: DC

## 2019-04-05 MED ORDER — BUPROPION HCL ER (SR) 150 MG PO TB12: 150.0000 mg | ORAL_TABLET | Freq: Every day | ORAL | 0 refills | Status: DC

## 2019-04-05 NOTE — Progress Notes (Signed)
Chief Complaint:   OBESITY Kelly Fry is here to discuss her progress with her obesity treatment plan along with follow-up of her obesity related diagnoses. [name] is on the Category 2 Plan and states she is following her eating plan approximately 85% of the time. [name] states she is walking 20 minutes 2 times per week.  Today's visit was #: 17 Starting weight: 293 lbs Starting date: 09/02/2017 Today's weight: 280 lbs Today's date: 04/05/2019 Total lbs lost to date: 13 Total lbs lost since last in-office visit: 0  Interim History: Kelly Fry is unsure why she has gained weight. She has stuck to the plan very well. She has been walking two times per week as directed. She is still not drinking soda. This had previously been a daily habit.  Subjective:   Insulin resistance  Kelly Fry has a diagnosis of insulin resistance based on her elevated fasting insulin level >5. She continues to work on diet and exercise to decrease her risk of diabetes. Kelly Fry denies polyphagia.  Lab Results  Component Value Date   INSULIN 5.1 01/12/2019   INSULIN 20.0 09/08/2018   INSULIN 19.7 03/05/2018   INSULIN 20.3 12/02/2017   INSULIN 42.7 (H) 09/02/2017   Lab Results  Component Value Date   HGBA1C 5.1 01/12/2019   Gastroesophageal reflux disease, unspecified whether esophagitis present  Kelly Fry has worsening gastroesophageal reflux disease. She is having reflux several times per day. She has a history of this but it had improved until recently. Even water can cause relux. She denies blood in her stool. Her stool is not dark. She has tried Tums which didn't provide any relief.    Other Depression with emotional eating  She denies stress eating at this time. She has been working on behavior modification techniques to help reduce her emotional eating and has been somewhat successful. She shows no sign of suicidal or homicidal ideations.  Assessment/Plan:   Insulin resistance Kelly Fry will continue  to work on weight loss, exercise, and decreasing simple carbohydrates to help decrease the risk of diabetes. Kelly Fry agreed to continue Metformin 500 mg qAM #30 with no refills and follow-up with [place] as directed to closely monitor her progress.  Gastroesophageal reflux disease, unspecified whether esophagitis present Kelly Fry agrees to start omeprazole (PRILOSEC) 20 MG capsule daily #30 with no refills. We will re-evaluate in one month and try to discontinue  [name] will avoid food triggers such as caffeine, chocolate, spicy foods. She will elevated HOB and avoid eating close to bedtime.  Other Depression with emotional eating Kelly Fry agrees to continue buPROPion (WELLBUTRIN SR) 150 MG 12 hr tablet qAM #30 with no refills. Orders and follow up as documented in patient record.   Class 3 severe obesity with serious comorbidity and body mass index (BMI) of 45.0 to 49.9 in adult, unspecified obesity type (HCC) Kelly Fry is currently in the action stage of change. As such, her goal is to continue with weight loss efforts. She has agreed to the Category 2 Plan.   Exercise goals: Kelly Fry will continue walking for 20 minutes, 2 times per week.  Behavioral modification strategies: better snacking choices and planning for success. We discussed the low carb plan today, and she will consider this.  Kelly Fry has agreed to follow-up with our clinic in 2 weeks. She was informed of the importance of frequent follow-up visits to maximize her success with intensive lifestyle modifications for her multiple health conditions.   Objective:   Blood pressure 123/74, pulse 60, temperature 98  F (36.7 C), temperature source Oral, height 5\' 4"  (1.626 m), weight 280 lb (127 kg), SpO2 99 %. Body mass index is 48.06 kg/m.  General: Cooperative, alert, well developed, in no acute distress. HEENT: Conjunctivae and lids unremarkable. Cardiovascular: Regular rhythm.  Lungs: Normal work of breathing. Neurologic: No focal deficits.    Lab Results  Component Value Date   CREATININE 0.99 09/08/2018   BUN 14 09/08/2018   NA 139 09/08/2018   K 3.8 09/08/2018   CL 102 09/08/2018   CO2 21 09/08/2018   Lab Results  Component Value Date   ALT 17 09/08/2018   AST 14 09/08/2018   ALKPHOS 84 09/08/2018   BILITOT 0.5 09/08/2018   Lab Results  Component Value Date   HGBA1C 5.1 01/12/2019   HGBA1C 4.9 09/08/2018   HGBA1C 5.1 03/05/2018   HGBA1C 5.0 12/02/2017   HGBA1C 5.2 09/02/2017   Lab Results  Component Value Date   INSULIN 5.1 01/12/2019   INSULIN 20.0 09/08/2018   INSULIN 19.7 03/05/2018   INSULIN 20.3 12/02/2017   INSULIN 42.7 (H) 09/02/2017   Lab Results  Component Value Date   TSH 2.320 09/08/2018   Lab Results  Component Value Date   CHOL 148 09/08/2018   HDL 36 (L) 09/08/2018   LDLCALC 93 09/08/2018   LDLDIRECT 118 (H) 06/24/2017   TRIG 94 09/08/2018   CHOLHDL 3.5 06/30/2015   Lab Results  Component Value Date   WBC 7.3 09/02/2017   HGB 15.3 09/02/2017   HCT 45.5 09/02/2017   MCV 88 09/02/2017   PLT 247 02/19/2017   Lab Results  Component Value Date   IRON 101 06/30/2015   TIBC 360 06/30/2015   FERRITIN 56 06/30/2015    Ref. Range 01/12/2019 00:00  Vitamin D, 25-Hydroxy Latest Ref Range: 30.0 - 100.0 ng/mL 37.5    Attestation Statements:   Reviewed by clinician on day of visit: allergies, medications, problem list, medical history, surgical history, family history, social history, and previous encounter notes.  Corey Skains, am acting as Location manager for [employer], FNP-C.  I have reviewed the above documentation for accuracy and completeness, and I agree with the above. -  Jaykob Minichiello [employer], FNP-C

## 2019-04-07 ENCOUNTER — Encounter (INDEPENDENT_AMBULATORY_CARE_PROVIDER_SITE_OTHER): Payer: Self-pay | Admitting: Family Medicine

## 2019-04-07 DIAGNOSIS — K219 Gastro-esophageal reflux disease without esophagitis: Secondary | ICD-10-CM | POA: Insufficient documentation

## 2019-04-19 ENCOUNTER — Ambulatory Visit (INDEPENDENT_AMBULATORY_CARE_PROVIDER_SITE_OTHER): Payer: Medicaid Other | Admitting: Family Medicine

## 2019-04-19 ENCOUNTER — Other Ambulatory Visit: Payer: Self-pay

## 2019-04-19 ENCOUNTER — Encounter (INDEPENDENT_AMBULATORY_CARE_PROVIDER_SITE_OTHER): Payer: Self-pay | Admitting: Family Medicine

## 2019-04-19 VITALS — BP 137/75 | HR 65 | Temp 98.0°F | Ht 64.0 in | Wt 282.0 lb

## 2019-04-19 DIAGNOSIS — E88819 Insulin resistance, unspecified: Secondary | ICD-10-CM

## 2019-04-19 DIAGNOSIS — F418 Other specified anxiety disorders: Secondary | ICD-10-CM | POA: Diagnosis not present

## 2019-04-19 DIAGNOSIS — Z6841 Body Mass Index (BMI) 40.0 and over, adult: Secondary | ICD-10-CM

## 2019-04-19 DIAGNOSIS — E559 Vitamin D deficiency, unspecified: Secondary | ICD-10-CM

## 2019-04-19 DIAGNOSIS — K219 Gastro-esophageal reflux disease without esophagitis: Secondary | ICD-10-CM

## 2019-04-19 DIAGNOSIS — E8881 Metabolic syndrome: Secondary | ICD-10-CM

## 2019-04-19 MED ORDER — VITAMIN D (ERGOCALCIFEROL) 1.25 MG (50000 UNIT) PO CAPS: 50000.0000 [IU] | ORAL_CAPSULE | ORAL | 0 refills | Status: DC

## 2019-04-19 MED ORDER — TOPIRAMATE 25 MG PO TABS: 25.0000 mg | ORAL_TABLET | Freq: Every day | ORAL | 0 refills | Status: DC

## 2019-04-19 MED ORDER — ESCITALOPRAM OXALATE 10 MG PO TABS: ORAL_TABLET | ORAL | 0 refills | Status: DC

## 2019-04-19 MED ORDER — METFORMIN HCL 500 MG PO TABS: 500.0000 mg | ORAL_TABLET | Freq: Two times a day (BID) | ORAL | 0 refills | Status: DC

## 2019-04-19 NOTE — Progress Notes (Signed)
Chief Complaint:   OBESITY Kelly Fry is here to discuss her progress with her obesity treatment plan along with follow-up of her obesity related diagnoses. Kelly Fry is on the Category 2 Plan and states she is following her eating plan approximately 50% of the time. Kelly Fry states she is walking for 20 minutes 3 times per week.  Today's visit was #: 63 Starting weight: 293 lbs Starting date: 09/02/17 Today's weight: 282 lbs Today's date: 04/19/2019 Total lbs lost to date: 11 Total lbs lost since last in-office visit: 0  Interim History: Kelly Fry reports starting to drink Pepsi again 2 times a day. This has been a habit in the past. She is eating all of the food on the plan and is doing well overall with the food on the plan.  Subjective:   1. Gastroesophageal reflux disease without esophagitis Kelly Fry started Prilosec 2 weeks ago. It has helped with GERD. She feels this may have been related to peach mango water additive she was using..  2. Depression with emotional eating Kelly Fry reports cravings sodas. She had been off of sodas for a while because Topamax helped, but we discontinued it. Her mood is stable, but she reports some depression. She denies suicidal ideas or homicidal ideas.  3. Insulin resistance Kelly Fry notes polyphagia. She feels metformin helps with hunger and she is on 500 mg once daily.  4. Vitamin D deficiency Kelly Fry's Vit D level is not at goal. Last Vit D level was 37.5 on 01/12/2019. She is on weekly prescription Vit D.  Assessment/Plan:   1. Gastroesophageal reflux disease without esophagitis Intensive lifestyle modifications are the first line treatment for this issue. We discussed several lifestyle modifications today and she will continue to work on diet, exercise and weight loss efforts. Nadiya agreed to continue Prilosec until her 30 day supply is complete. Orders and follow up as documented in patient record.   2. Depression with emotional eating Behavior  modification techniques were discussed today to help Kelly Fry deal with her emotional/non-hunger eating behaviors. Kelly Fry agreed to start Topamax 25 mg qhs with no refills, and we will refill Lexapro for 1 month. She is to ask for counselor referral from her primary care physician. Orders and follow up as documented in patient record.   - escitalopram (LEXAPRO) 10 MG tablet; TAKE 1 TABLET BY MOUTH ONCE DAILY IN THE MORNING  Dispense: 30 tablet; Refill: 0 - topiramate (TOPAMAX) 25 MG tablet; Take 1 tablet (25 mg total) by mouth at bedtime.  Dispense: 30 tablet; Refill: 0  3. Insulin resistance Kelly Fry will continue to work on weight loss, exercise, and decreasing simple carbohydrates to help decrease the risk of diabetes. Kelly Fry agreed to increase metformin to 500 mg BID with no refills. Kelly Fry agreed to follow-up with [place] as directed to closely monitor her progress.  - metFORMIN (GLUCOPHAGE) 500 MG tablet; Take 1 tablet (500 mg total) by mouth 2 (two) times daily with a meal.  Dispense: 60 tablet; Refill: 0  4. Vitamin D deficiency Low Vitamin D level contributes to fatigue and are associated with obesity, breast, and colon cancer. We will refill prescription Vitamin D for 1 month. Kelly Fry will follow-up for routine testing of Vitamin D, at least 2-3 times per year to avoid over-replacement.  - Vitamin D, Ergocalciferol, (DRISDOL) 1.25 MG (50000 UNIT) CAPS capsule; Take 1 capsule (50,000 Units total) by mouth every 7 (seven) days.  Dispense: 4 capsule; Refill: 0  5. Class 3 severe obesity with serious comorbidity and body  mass index (BMI) of 45.0 to 49.9 in adult, unspecified obesity type Kelly Fry) Kelly Fry is currently in the action stage of change. As such, her goal is to continue with weight loss efforts. She has agreed to the Category 2 Plan.   Exercise goals: Kelly Fry is to continue her current exercise regimen as is.  Behavioral modification strategies: decreasing liquid calories and planning for  success.  Kelly Fry has agreed to follow-up with our clinic in 2 weeks. She was informed of the importance of frequent follow-up visits to maximize her success with intensive lifestyle modifications for her multiple health conditions.   Objective:   Blood pressure 137/75, pulse 65, temperature 98 F (36.7 C), temperature source Oral, height 5\' 4"  (1.626 m), weight 282 lb (127.9 kg), SpO2 98 %. Body mass index is 48.41 kg/m.  General: Cooperative, alert, well developed, in no acute distress. HEENT: Conjunctivae and lids unremarkable. Cardiovascular: Regular rhythm.  Lungs: Normal work of breathing. Neurologic: No focal deficits.   Lab Results  Component Value Date   CREATININE 0.99 09/08/2018   BUN 14 09/08/2018   NA 139 09/08/2018   K 3.8 09/08/2018   CL 102 09/08/2018   CO2 21 09/08/2018   Lab Results  Component Value Date   ALT 17 09/08/2018   AST 14 09/08/2018   ALKPHOS 84 09/08/2018   BILITOT 0.5 09/08/2018   Lab Results  Component Value Date   HGBA1C 5.1 01/12/2019   HGBA1C 4.9 09/08/2018   HGBA1C 5.1 03/05/2018   HGBA1C 5.0 12/02/2017   HGBA1C 5.2 09/02/2017   Lab Results  Component Value Date   INSULIN 5.1 01/12/2019   INSULIN 20.0 09/08/2018   INSULIN 19.7 03/05/2018   INSULIN 20.3 12/02/2017   INSULIN 42.7 (H) 09/02/2017   Lab Results  Component Value Date   TSH 2.320 09/08/2018   Lab Results  Component Value Date   CHOL 148 09/08/2018   HDL 36 (L) 09/08/2018   LDLCALC 93 09/08/2018   LDLDIRECT 118 (H) 06/24/2017   TRIG 94 09/08/2018   CHOLHDL 3.5 06/30/2015   Lab Results  Component Value Date   WBC 7.3 09/02/2017   HGB 15.3 09/02/2017   HCT 45.5 09/02/2017   MCV 88 09/02/2017   PLT 247 02/19/2017   Lab Results  Component Value Date   IRON 101 06/30/2015   TIBC 360 06/30/2015   FERRITIN 56 06/30/2015    Attestation Statements:   Reviewed by clinician on day of visit: allergies, medications, problem list, medical history, surgical  history, family history, social history, and previous encounter notes.   Wilhemena Durie, am acting as Location manager for [employer], FNP-C.  I have reviewed the above documentation for accuracy and completeness, and I agree with the above. -  Georgianne Fick, FNP

## 2019-04-20 ENCOUNTER — Encounter (INDEPENDENT_AMBULATORY_CARE_PROVIDER_SITE_OTHER): Payer: Self-pay | Admitting: Family Medicine

## 2019-05-03 ENCOUNTER — Encounter (INDEPENDENT_AMBULATORY_CARE_PROVIDER_SITE_OTHER): Payer: Self-pay | Admitting: Family Medicine

## 2019-05-03 ENCOUNTER — Other Ambulatory Visit: Payer: Self-pay

## 2019-05-03 ENCOUNTER — Ambulatory Visit (INDEPENDENT_AMBULATORY_CARE_PROVIDER_SITE_OTHER): Payer: Medicaid Other | Admitting: Family Medicine

## 2019-05-03 VITALS — BP 113/78 | HR 77 | Temp 98.2°F | Ht 64.0 in | Wt 280.0 lb

## 2019-05-03 DIAGNOSIS — E559 Vitamin D deficiency, unspecified: Secondary | ICD-10-CM

## 2019-05-03 DIAGNOSIS — Z6841 Body Mass Index (BMI) 40.0 and over, adult: Secondary | ICD-10-CM | POA: Diagnosis not present

## 2019-05-03 DIAGNOSIS — R238 Other skin changes: Secondary | ICD-10-CM | POA: Diagnosis not present

## 2019-05-03 DIAGNOSIS — F418 Other specified anxiety disorders: Secondary | ICD-10-CM | POA: Diagnosis not present

## 2019-05-03 DIAGNOSIS — E8881 Metabolic syndrome: Secondary | ICD-10-CM | POA: Diagnosis not present

## 2019-05-03 DIAGNOSIS — R233 Spontaneous ecchymoses: Secondary | ICD-10-CM

## 2019-05-03 MED ORDER — ESCITALOPRAM OXALATE 10 MG PO TABS: ORAL_TABLET | ORAL | 0 refills | Status: DC

## 2019-05-04 ENCOUNTER — Encounter (INDEPENDENT_AMBULATORY_CARE_PROVIDER_SITE_OTHER): Payer: Self-pay | Admitting: Family Medicine

## 2019-05-04 LAB — COMPREHENSIVE METABOLIC PANEL
ALT: 14 IU/L (ref 0–32)
AST: 15 IU/L (ref 0–40)
Albumin/Globulin Ratio: 1.9 (ref 1.2–2.2)
Albumin: 4.5 g/dL (ref 3.8–4.8)
Alkaline Phosphatase: 99 IU/L (ref 39–117)
BUN/Creatinine Ratio: 16 (ref 9–23)
BUN: 16 mg/dL (ref 6–20)
Bilirubin Total: 0.3 mg/dL (ref 0.0–1.2)
CO2: 23 mmol/L (ref 20–29)
Calcium: 9.3 mg/dL (ref 8.7–10.2)
Chloride: 105 mmol/L (ref 96–106)
Creatinine, Ser: 1.03 mg/dL — ABNORMAL HIGH (ref 0.57–1.00)
GFR calc Af Amer: 84 mL/min/{1.73_m2} (ref >59)
GFR calc non Af Amer: 73 mL/min/{1.73_m2} (ref >59)
Globulin, Total: 2.4 g/dL (ref 1.5–4.5)
Glucose: 81 mg/dL (ref 65–99)
Potassium: 4 mmol/L (ref 3.5–5.2)
Sodium: 140 mmol/L (ref 134–144)
Total Protein: 6.9 g/dL (ref 6.0–8.5)

## 2019-05-04 LAB — CBC WITH DIFFERENTIAL/PLATELET
Basophils Absolute: 0 10*3/uL (ref 0.0–0.2)
Basos: 1 %
EOS (ABSOLUTE): 0.1 10*3/uL (ref 0.0–0.4)
Eos: 2 %
Hematocrit: 43.6 % (ref 34.0–46.6)
Hemoglobin: 14.9 g/dL (ref 11.1–15.9)
Immature Grans (Abs): 0 10*3/uL (ref 0.0–0.1)
Immature Granulocytes: 1 %
Lymphocytes Absolute: 1.5 10*3/uL (ref 0.7–3.1)
Lymphs: 23 %
MCH: 30.2 pg (ref 26.6–33.0)
MCHC: 34.2 g/dL (ref 31.5–35.7)
MCV: 88 fL (ref 79–97)
Monocytes Absolute: 0.6 10*3/uL (ref 0.1–0.9)
Monocytes: 9 %
Neutrophils Absolute: 4.3 10*3/uL (ref 1.4–7.0)
Neutrophils: 64 %
Platelets: 272 10*3/uL (ref 150–450)
RBC: 4.94 x10E6/uL (ref 3.77–5.28)
RDW: 12 % (ref 11.7–15.4)
WBC: 6.5 10*3/uL (ref 3.4–10.8)

## 2019-05-04 LAB — HEMOGLOBIN A1C
Est. average glucose Bld gHb Est-mCnc: 97 mg/dL
Hgb A1c MFr Bld: 5 % (ref 4.8–5.6)

## 2019-05-04 LAB — VITAMIN D 25 HYDROXY (VIT D DEFICIENCY, FRACTURES): Vit D, 25-Hydroxy: 53.9 ng/mL (ref 30.0–100.0)

## 2019-05-04 NOTE — Progress Notes (Signed)
Chief Complaint:   OBESITY Kelly Fry is here to discuss her progress with her obesity treatment plan along with follow-up of her obesity related diagnoses. Kelly Fry is on the Category 2 Plan and states she is following her eating plan approximately 80% of the time. Kelly Fry states she is walking and working out with exercise DVD for 20-30 minutes 5 times per week.  Today's visit was #: 29 Starting weight: 293 lbs Starting date: 09/02/2017 Today's weight: 280 lbs Today's date: 05/03/2019 Total lbs lost to date: 13 Total lbs lost since last in-office visit: 2  Interim History: Nefretiri has started exercising over the past few weeks. She has stopped drinking regular soda. She is eating all of the prescribed food.  Subjective:   1. Easy bruising Kelly Fry denies history of anemia. She has noticed that she is bruising more easily over the last month. She has been craving red meat recently.  2. Vitamin D deficiency Kelly Fry is on prescription Vit D. Last Vit D was low at 37.5.   3. Insulin resistance Kelly Fry is on metformin 500 mg BID. She denies polyphagia.  4. Depression with emotional eating Kelly Fry was started on Topamax at her last office visit, and she has stopped regular soda due to dysgeusia from topamax. Her mood is stable on Lexapro.   Assessment/Plan:   1. Easy bruising We will check labs today and will follow up.  - CBC with Differential/Platelet  2. Vitamin D deficiency Low Vitamin D level contributes to fatigue and are associated with obesity, breast, and colon cancer. Violett agreed to continue taking prescription Vitamin D 50,000 IU every week and will follow-up for routine testing of Vitamin D, at least 2-3 times per year to avoid over-replacement. We will check labs today.  - VITAMIN D 25 Hydroxy (Vit-D Deficiency, Fractures)  3. Insulin resistance Mella will continue to work on weight loss, exercise, and decreasing simple carbohydrates to help decrease the risk of  diabetes. We will check labs today. Kashlyn agreed to continue metformin, and she agreed to follow-up with [place] as directed to closely monitor her progress.  - Comprehensive metabolic panel - Hemoglobin A1c  4. Depression with emotional eating Behavior modification techniques were discussed today to help Kelly Fry deal with her emotional/non-hunger eating behaviors. Ra agreed to continue Topamax, and we will refill Lexapro for 1 month. Orders and follow up as documented in patient record.   - escitalopram (LEXAPRO) 10 MG tablet; TAKE 1 TABLET BY MOUTH ONCE DAILY IN THE MORNING  Dispense: 30 tablet; Refill: 0  5. Class 3 severe obesity with serious comorbidity and body mass index (BMI) of 45.0 to 49.9 in adult, unspecified obesity type (HCC) Kelly Fry is currently in the action stage of change. As such, her goal is to continue with weight loss efforts. She has agreed to the Category 2 Plan.   Exercise goals: As is.  Behavioral modification strategies: increasing lean protein intake, decreasing simple carbohydrates, decreasing liquid calories and planning for success.  Kelly Fry has agreed to follow-up with our clinic in 2 weeks. She was informed of the importance of frequent follow-up visits to maximize her success with intensive lifestyle modifications for her multiple health conditions.   Kelly Fry was informed we would discuss her lab results at her next visit unless there is a critical issue that needs to be addressed sooner. Kelly Fry agreed to keep her next visit at the agreed upon time to discuss these results.  Objective:   Blood pressure 113/78, pulse 77, temperature 98.2  F (36.8 C), temperature source Oral, height 5\' 4"  (1.626 m), weight 280 lb (127 kg), SpO2 98 %. Body mass index is 48.06 kg/m.  General: Cooperative, alert, well developed, in no acute distress. HEENT: Conjunctivae and lids unremarkable. Cardiovascular: Regular rhythm.  Lungs: Normal work of breathing. Neurologic: No  focal deficits.   Lab Results  Component Value Date   CREATININE 1.03 (H) 05/03/2019   BUN 16 05/03/2019   NA 140 05/03/2019   K 4.0 05/03/2019   CL 105 05/03/2019   CO2 23 05/03/2019   Lab Results  Component Value Date   ALT 14 05/03/2019   AST 15 05/03/2019   ALKPHOS 99 05/03/2019   BILITOT 0.3 05/03/2019   Lab Results  Component Value Date   HGBA1C 5.0 05/03/2019   HGBA1C 5.1 01/12/2019   HGBA1C 4.9 09/08/2018   HGBA1C 5.1 03/05/2018   HGBA1C 5.0 12/02/2017   Lab Results  Component Value Date   INSULIN 5.1 01/12/2019   INSULIN 20.0 09/08/2018   INSULIN 19.7 03/05/2018   INSULIN 20.3 12/02/2017   INSULIN 42.7 (H) 09/02/2017   Lab Results  Component Value Date   TSH 2.320 09/08/2018   Lab Results  Component Value Date   CHOL 148 09/08/2018   HDL 36 (L) 09/08/2018   LDLCALC 93 09/08/2018   LDLDIRECT 118 (H) 06/24/2017   TRIG 94 09/08/2018   CHOLHDL 3.5 06/30/2015   Lab Results  Component Value Date   WBC 6.5 05/03/2019   HGB 14.9 05/03/2019   HCT 43.6 05/03/2019   MCV 88 05/03/2019   PLT 272 05/03/2019   Lab Results  Component Value Date   IRON 101 06/30/2015   TIBC 360 06/30/2015   FERRITIN 56 06/30/2015   Attestation Statements:   Reviewed by clinician on day of visit: allergies, medications, problem list, medical history, surgical history, family history, social history, and previous encounter notes.   Wilhemena Durie, am acting as Location manager for [employer], FNP-C.  I have reviewed the above documentation for accuracy and completeness, and I agree with the above. -  Georgianne Fick, FNP

## 2019-05-17 ENCOUNTER — Ambulatory Visit (INDEPENDENT_AMBULATORY_CARE_PROVIDER_SITE_OTHER): Payer: Medicaid Other | Admitting: Family Medicine

## 2019-05-19 ENCOUNTER — Ambulatory Visit (INDEPENDENT_AMBULATORY_CARE_PROVIDER_SITE_OTHER): Payer: Medicaid Other | Admitting: Family Medicine

## 2019-05-19 ENCOUNTER — Encounter (INDEPENDENT_AMBULATORY_CARE_PROVIDER_SITE_OTHER): Payer: Self-pay | Admitting: Family Medicine

## 2019-05-19 ENCOUNTER — Other Ambulatory Visit: Payer: Self-pay

## 2019-05-19 VITALS — BP 122/81 | HR 77 | Temp 98.2°F | Ht 64.0 in | Wt 280.0 lb

## 2019-05-19 DIAGNOSIS — E559 Vitamin D deficiency, unspecified: Secondary | ICD-10-CM

## 2019-05-19 DIAGNOSIS — Z6841 Body Mass Index (BMI) 40.0 and over, adult: Secondary | ICD-10-CM | POA: Diagnosis not present

## 2019-05-19 DIAGNOSIS — E8881 Metabolic syndrome: Secondary | ICD-10-CM | POA: Diagnosis not present

## 2019-05-19 DIAGNOSIS — E88819 Insulin resistance, unspecified: Secondary | ICD-10-CM

## 2019-05-19 DIAGNOSIS — J302 Other seasonal allergic rhinitis: Secondary | ICD-10-CM

## 2019-05-19 DIAGNOSIS — F418 Other specified anxiety disorders: Secondary | ICD-10-CM

## 2019-05-19 MED ORDER — METFORMIN HCL 500 MG PO TABS: 500.0000 mg | ORAL_TABLET | Freq: Two times a day (BID) | ORAL | 0 refills | Status: DC

## 2019-05-19 MED ORDER — TOPIRAMATE 50 MG PO TABS: 50.0000 mg | ORAL_TABLET | Freq: Every day | ORAL | 0 refills | Status: DC

## 2019-05-19 MED ORDER — ESCITALOPRAM OXALATE 10 MG PO TABS: ORAL_TABLET | ORAL | 0 refills | Status: DC

## 2019-05-19 MED ORDER — VITAMIN D (ERGOCALCIFEROL) 1.25 MG (50000 UNIT) PO CAPS: 50000.0000 [IU] | ORAL_CAPSULE | ORAL | 0 refills | Status: DC

## 2019-05-19 MED ORDER — FLUTICASONE PROPIONATE 50 MCG/ACT NA SUSP: 2.0000 | Freq: Every day | NASAL | 2 refills | Status: DC

## 2019-05-19 MED ORDER — BUPROPION HCL ER (SR) 150 MG PO TB12: 150.0000 mg | ORAL_TABLET | Freq: Every day | ORAL | 0 refills | Status: DC

## 2019-05-19 NOTE — Progress Notes (Signed)
Chief Complaint:   OBESITY Kelly Fry is here to discuss her progress with her obesity treatment plan along with follow-up of her obesity related diagnoses. Kelly Fry is on the Category 2 Plan and states she is following her eating plan approximately 0% of the time. Kelly Fry states she is doing 0 minutes 0 times per week.  Today's visit was #: 35 Starting weight: 293 lbs Starting date: 09/02/2017 Today's weight: 280 lbs Today's date: 05/19/2019 Total lbs lost to date: 13 Total lbs lost since last in-office visit: 0  Interim History: Kelly Fry traveled to [place] over Easter for the weekend and was somewhat off the plan. She has maintained her weight today. She has not felt well and her appetite is down. She has been skipping breakfast.  Subjective:   1. Insulin resistance Kelly Fry denies polyphagia on metformin BID. Lab Results  Component Value Date   HGBA1C 5.0 05/03/2019    2. Vitamin D deficiency Kelly Fry's last Vit D level was at goal. She reports her Vit d is always low and decreases without prescription Vit D. She notes fatigue.  3. Seasonal allergies Kelly Fry reports increased pressure in her ears since taking a trip to [place] this past weekend. She has a history of seasonal allergies. She notes occasional nasal congestion and itchy throat. She is taking Claritan sporadically without much benefit.   4. Depression with emotional eating  She reports she has been drinking some soda which she had previously stopped. She denies stress eating and her mood is stable.  Assessment/Plan:   1. Insulin resistance Kelly Fry will continue to work on weight loss, exercise, and decreasing simple carbohydrates to help decrease the risk of diabetes. We will refill metformin for 1 month. Kelly Fry agreed to follow-up with [place] as directed to closely monitor her progress.  - metFORMIN (GLUCOPHAGE) 500 MG tablet; Take 1 tablet (500 mg total) by mouth 2 (two) times daily with a meal.  Dispense: 60 tablet; Refill: 0   2. Vitamin D deficiency Low Vitamin D level contributes to fatigue and are associated with obesity, breast, and colon cancer. We will refill prescription Vitamin D for 1 month. Kelly Fry will follow-up for routine testing of Vitamin D, at least 2-3 times per year to avoid over-replacement.  - Vitamin D, Ergocalciferol, (DRISDOL) 1.25 MG (50000 UNIT) CAPS capsule; Take 1 capsule (50,000 Units total) by mouth every 7 (seven) days.  Dispense: 4 capsule; Refill: 0  3. Seasonal allergies Kelly Fry agreed to start OTC Zyrtec daily, and we will start Flonase for 1 month with 2 refills. She will DC Claritin.  - fluticasone (FLONASE) 50 MCG/ACT nasal spray; Place 2 sprays into both nostrils daily.  Dispense: 16 g; Refill: 2  4. Depression with emotional eating Behavior modification techniques were discussed today to help Kelly Fry deal with her emotional/non-hunger eating behaviors.  Kelly Fry agreed to increase Kelly Fry to 50 mg q daily with no refills, and we will refill Kelly Fry and Kelly Fry SR for 1 month. Orders and follow up as documented in patient record.   - escitalopram (Kelly Fry) 10 MG tablet; TAKE 1 TABLET BY MOUTH ONCE DAILY IN THE MORNING  Dispense: 30 tablet; Refill: 0 - topiramate (Kelly Fry) 50 MG tablet; Take 1 tablet (50 mg total) by mouth daily.  Dispense: 30 tablet; Refill: 0 - Kelly Fry (WELLBUTRIN SR) 150 MG 12 hr tablet; Take 1 tablet (150 mg total) by mouth daily.  Dispense: 30 tablet; Refill: 0  5. Class 3 severe obesity with serious comorbidity and body mass index (BMI)  of 45.0 to 49.9 in adult, unspecified obesity type Kelly Fry) Kelly Fry is currently in the action stage of change. As such, her goal is to continue with weight loss efforts. She has agreed to the Category 2 Plan. Kelly Fry may have a Premier shake for breakfast.  Exercise goals: Kelly Fry will start back exercising with her sister.  Behavioral modification strategies: increasing lean protein intake, decreasing simple carbohydrates and no  skipping meals.  Kelly Fry has agreed to follow-up with our clinic in 2 weeks. She was informed of the importance of frequent follow-up visits to maximize her success with intensive lifestyle modifications for her multiple health conditions.   Objective:   Blood pressure 122/81, pulse 77, temperature 98.2 F (36.8 C), temperature source Oral, height 5\' 4"  (1.626 m), weight 280 lb (127 kg), SpO2 99 %. Body mass index is 48.06 kg/m.  General: Cooperative, alert, well developed, in no acute distress. HEENT: Conjunctivae and lids unremarkable. Cardiovascular: Regular rhythm.  Lungs: Normal work of breathing. Neurologic: No focal deficits.   Lab Results  Component Value Date   CREATININE 1.03 (H) 05/03/2019   BUN 16 05/03/2019   NA 140 05/03/2019   K 4.0 05/03/2019   CL 105 05/03/2019   CO2 23 05/03/2019   Lab Results  Component Value Date   ALT 14 05/03/2019   AST 15 05/03/2019   ALKPHOS 99 05/03/2019   BILITOT 0.3 05/03/2019   Lab Results  Component Value Date   HGBA1C 5.0 05/03/2019   HGBA1C 5.1 01/12/2019   HGBA1C 4.9 09/08/2018   HGBA1C 5.1 03/05/2018   HGBA1C 5.0 12/02/2017   Lab Results  Component Value Date   INSULIN 5.1 01/12/2019   INSULIN 20.0 09/08/2018   INSULIN 19.7 03/05/2018   INSULIN 20.3 12/02/2017   INSULIN 42.7 (H) 09/02/2017   Lab Results  Component Value Date   TSH 2.320 09/08/2018   Lab Results  Component Value Date   CHOL 148 09/08/2018   HDL 36 (L) 09/08/2018   LDLCALC 93 09/08/2018   LDLDIRECT 118 (H) 06/24/2017   TRIG 94 09/08/2018   CHOLHDL 3.5 06/30/2015   Lab Results  Component Value Date   WBC 6.5 05/03/2019   HGB 14.9 05/03/2019   HCT 43.6 05/03/2019   MCV 88 05/03/2019   PLT 272 05/03/2019   Lab Results  Component Value Date   IRON 101 06/30/2015   TIBC 360 06/30/2015   FERRITIN 56 06/30/2015   Attestation Statements:   Reviewed by clinician on day of visit: allergies, medications, problem list, medical history,  surgical history, family history, social history, and previous encounter notes.   Wilhemena Durie, am acting as Location manager for [employer], FNP-C.  I have reviewed the above documentation for accuracy and completeness, and I agree with the above. - Georgianne Fick, FNP

## 2019-06-02 ENCOUNTER — Ambulatory Visit (INDEPENDENT_AMBULATORY_CARE_PROVIDER_SITE_OTHER): Payer: Medicaid Other | Admitting: Family Medicine

## 2019-06-02 ENCOUNTER — Other Ambulatory Visit: Payer: Self-pay

## 2019-06-02 ENCOUNTER — Encounter (INDEPENDENT_AMBULATORY_CARE_PROVIDER_SITE_OTHER): Payer: Self-pay | Admitting: Family Medicine

## 2019-06-02 VITALS — BP 117/77 | HR 72 | Temp 98.1°F | Ht 64.0 in | Wt 286.0 lb

## 2019-06-02 DIAGNOSIS — Z6841 Body Mass Index (BMI) 40.0 and over, adult: Secondary | ICD-10-CM | POA: Diagnosis not present

## 2019-06-02 DIAGNOSIS — F418 Other specified anxiety disorders: Secondary | ICD-10-CM | POA: Diagnosis not present

## 2019-06-02 DIAGNOSIS — J302 Other seasonal allergic rhinitis: Secondary | ICD-10-CM

## 2019-06-07 ENCOUNTER — Encounter (INDEPENDENT_AMBULATORY_CARE_PROVIDER_SITE_OTHER): Payer: Self-pay | Admitting: Family Medicine

## 2019-06-07 MED ORDER — TOPIRAMATE 50 MG PO TABS: 50.0000 mg | ORAL_TABLET | Freq: Every day | ORAL | 0 refills | Status: DC

## 2019-06-07 NOTE — Progress Notes (Signed)
Chief Complaint:   OBESITY ANEA Fry is here to discuss her progress with her obesity treatment plan along with follow-up of her obesity related diagnoses. Kelly Fry is on the Category 2 Plan and states she is following her eating plan approximately 75% of the time. Kelly Fry states she is exercising 0 minutes 0 times per week.  Today's visit was #: 76 Starting weight: 293 lbs Starting date: 09/02/2017 Today's weight: 286 lbs Today's date: 06/02/2019 Total lbs lost to date: 7 Total lbs lost since last in-office visit: 0  Interim History: Kelly Fry is eating more sugary foods and drinking 1-2 cans of soda per day. She reports eating all of the food on the plan but then overeating snack calories.  Subjective:   Depression with emotional eating. Kelly Fry is struggling with emotional eating and using food for comfort to the extent that it is negatively impacting her health. She has been working on behavior modification techniques to help reduce her emotional eating and has been somewhat successful. She shows no sign of suicidal or homicidal ideations. Kelly Fry reports drinking 1-2 cans of soda per day and also craves sugar. She no longer has dysgeusia with carbonated beverages. She has an implant for birth control.   Seasonal allergies. Kelly Fry notes improved symptoms with Zyrtec and Flonase.  Assessment/Plan:   Depression with emotional eating. Behavior modification techniques were discussed today to help [name] deal with her emotional/non-hunger eating behaviors.  Orders and follow up as documented in patient record. Refill was given for increased dose of topiramate (TOPAMAX) 50 MG tablet BID #60 with 0 refills.  Seasonal allergies. Abyan will continue Zyrtec and Flonase as directed.  Class 3 severe obesity with serious comorbidity and body mass index (BMI) of 45.0 to 49.9 in adult, unspecified obesity type ([place]).  Kelly Fry is currently in the action stage of change. As such, her goal is to  continue with weight loss efforts. She has agreed to the Category 2 Plan.   She will decrease sodas; will try the mini cans of soda if she must have soda. Will also give diet soda a try.  Exercise goals: Kelly Fry will start exercise videos in the a.m. 3-4 times a week.  Behavioral modification strategies: decreasing liquid calories and planning for success.  Kelly Fry has agreed to follow-up with our clinic in 2 weeks. She was informed of the importance of frequent follow-up visits to maximize her success with intensive lifestyle modifications for her multiple health conditions.   Objective:   Blood pressure 117/77, pulse 72, temperature 98.1 F (36.7 C), temperature source Oral, height 5\' 4"  (1.626 m), weight 286 lb (129.7 kg), SpO2 98 %. Body mass index is 49.09 kg/m.  General: Cooperative, alert, well developed, in no acute distress. HEENT: Conjunctivae and lids unremarkable. Cardiovascular: Regular rhythm.  Lungs: Normal work of breathing. Neurologic: No focal deficits.   Lab Results  Component Value Date   CREATININE 1.03 (H) 05/03/2019   BUN 16 05/03/2019   NA 140 05/03/2019   K 4.0 05/03/2019   CL 105 05/03/2019   CO2 23 05/03/2019   Lab Results  Component Value Date   ALT 14 05/03/2019   AST 15 05/03/2019   ALKPHOS 99 05/03/2019   BILITOT 0.3 05/03/2019   Lab Results  Component Value Date   HGBA1C 5.0 05/03/2019   HGBA1C 5.1 01/12/2019   HGBA1C 4.9 09/08/2018   HGBA1C 5.1 03/05/2018   HGBA1C 5.0 12/02/2017   Lab Results  Component Value Date   INSULIN  5.1 01/12/2019   INSULIN 20.0 09/08/2018   INSULIN 19.7 03/05/2018   INSULIN 20.3 12/02/2017   INSULIN 42.7 (H) 09/02/2017   Lab Results  Component Value Date   TSH 2.320 09/08/2018   Lab Results  Component Value Date   CHOL 148 09/08/2018   HDL 36 (L) 09/08/2018   LDLCALC 93 09/08/2018   LDLDIRECT 118 (H) 06/24/2017   TRIG 94 09/08/2018   CHOLHDL 3.5 06/30/2015   Lab Results  Component Value Date     WBC 6.5 05/03/2019   HGB 14.9 05/03/2019   HCT 43.6 05/03/2019   MCV 88 05/03/2019   PLT 272 05/03/2019   Lab Results  Component Value Date   IRON 101 06/30/2015   TIBC 360 06/30/2015   FERRITIN 56 06/30/2015   Attestation Statements:   Reviewed by clinician on day of visit: allergies, medications, problem list, medical history, surgical history, family history, social history, and previous encounter notes.  IMichaelene Song, am acting as Location manager for [employer], FNP   I have reviewed the above documentation for accuracy and completeness, and I agree with the above. -  Georgianne Fick, FNP

## 2019-06-16 ENCOUNTER — Other Ambulatory Visit: Payer: Self-pay

## 2019-06-16 ENCOUNTER — Encounter (INDEPENDENT_AMBULATORY_CARE_PROVIDER_SITE_OTHER): Payer: Self-pay | Admitting: Family Medicine

## 2019-06-16 ENCOUNTER — Ambulatory Visit (INDEPENDENT_AMBULATORY_CARE_PROVIDER_SITE_OTHER): Payer: Medicaid Other | Admitting: Family Medicine

## 2019-06-16 VITALS — BP 115/73 | HR 66 | Temp 98.4°F | Ht 64.0 in | Wt 285.0 lb

## 2019-06-16 DIAGNOSIS — E559 Vitamin D deficiency, unspecified: Secondary | ICD-10-CM | POA: Diagnosis not present

## 2019-06-16 DIAGNOSIS — Z6841 Body Mass Index (BMI) 40.0 and over, adult: Secondary | ICD-10-CM | POA: Diagnosis not present

## 2019-06-16 DIAGNOSIS — F418 Other specified anxiety disorders: Secondary | ICD-10-CM

## 2019-06-16 DIAGNOSIS — E8881 Metabolic syndrome: Secondary | ICD-10-CM

## 2019-06-16 MED ORDER — METFORMIN HCL 500 MG PO TABS: 500.0000 mg | ORAL_TABLET | Freq: Two times a day (BID) | ORAL | 0 refills | Status: DC

## 2019-06-16 MED ORDER — BUPROPION HCL ER (SR) 150 MG PO TB12: 150.0000 mg | ORAL_TABLET | Freq: Every day | ORAL | 0 refills | Status: DC

## 2019-06-16 MED ORDER — ESCITALOPRAM OXALATE 10 MG PO TABS: ORAL_TABLET | ORAL | 0 refills | Status: DC

## 2019-06-16 MED ORDER — VITAMIN D (ERGOCALCIFEROL) 1.25 MG (50000 UNIT) PO CAPS: 50000.0000 [IU] | ORAL_CAPSULE | ORAL | 0 refills | Status: DC

## 2019-06-16 NOTE — Progress Notes (Addendum)
Chief Complaint:   OBESITY Kelly Fry is here to discuss her progress with her obesity treatment plan along with follow-up of her obesity related diagnoses. Kelly Fry is on the Category 2 Plan and states she is following her eating plan approximately 85% of the time. Kelly Fry states she is walking for 30 minutes 4-7 times per week.  Today's visit was #: 57 Starting weight: 293 lbs Starting date: 09/02/2017 Today's weight: 285 lbs Today's date: 06/16/2019 Total lbs lost to date: 8 Total lbs lost since last in-office visit: 1  Interim History: Kelly Fry is now back on track. She is now drinking diet soda,- she had been drinking several Pepsis per day. She is getting adequate protein. She is grilling her meat at dinner.  Subjective:   1. Insulin resistance Kelly Fry denies polyphagia on metformin. Lab Results  Component Value Date   HGBA1C 5.0 05/03/2019    2. Vitamin D deficiency Kelly Fry's last Vit D level was at goal (19). She is on prescription Vit D.  3. Depression with anxiety Kelly Fry's mood is stable with topamax and bupropion. She is on Topamax 50 mg daily. She notes she has decreased regular soda.  Assessment/Plan:   1. Insulin resistance Maritta will continue to work on weight loss, exercise, and decreasing simple carbohydrates to help decrease the risk of diabetes. We will refill metformin for 1 month. Kelly Fry agreed to follow-up with [place] as directed to closely monitor her progress.  - metFORMIN (GLUCOPHAGE) 500 MG tablet; Take 1 tablet (500 mg total) by mouth 2 (two) times daily with a meal.  Dispense: 60 tablet; Refill: 0  2. Vitamin D deficiency Low Vitamin D level contributes to fatigue and are associated with obesity, breast, and colon cancer. We will refill prescription Vitamin D for 1 month. Kelly Fry will follow-up for routine testing of Vitamin D, at least 2-3 times per year to avoid over-replacement.  - Vitamin D, Ergocalciferol, (DRISDOL) 1.25 MG (50000 UNIT) CAPS capsule;  Take 1 capsule (50,000 Units total) by mouth every 7 (seven) days.  Dispense: 4 capsule; Refill: 0  3. Depression with anxiety Behavior modification techniques were discussed today to help Kelly Fry deal with her emotional/non-hunger eating behaviors. We will refill Lexapro and bupropion for 1 month. Orders and follow up as documented in patient record.   - buPROPion (WELLBUTRIN SR) 150 MG 12 hr tablet; Take 1 tablet (150 mg total) by mouth daily.  Dispense: 30 tablet; Refill: 0 - escitalopram (LEXAPRO) 10 MG tablet; TAKE 1 TABLET BY MOUTH ONCE DAILY IN THE MORNING  Dispense: 30 tablet; Refill: 0  4. Class 3 severe obesity with serious comorbidity and body mass index (BMI) of 45.0 to 49.9 in adult, unspecified obesity type (HCC) Kelly Fry is currently in the action stage of change. As such, her goal is to continue with weight loss efforts. She has agreed to the Category 2 Plan.   Exercise goals: As is.  Behavioral modification strategies: increasing lean protein intake, decreasing simple carbohydrates and planning for success.  Kelly Fry has agreed to follow-up with our clinic in 3 weeks. She was informed of the importance of frequent follow-up visits to maximize her success with intensive lifestyle modifications for her multiple health conditions.   Objective:   Blood pressure 115/73, pulse 66, temperature 98.4 F (36.9 C), temperature source Oral, height 5\' 4"  (1.626 m), weight 285 lb (129.3 kg), SpO2 98 %. Body mass index is 48.92 kg/m.  General: Cooperative, alert, well developed, in no acute distress. HEENT: Conjunctivae and lids  unremarkable. Cardiovascular: Regular rhythm.  Lungs: Normal work of breathing. Neurologic: No focal deficits.   Lab Results  Component Value Date   CREATININE 1.03 (H) 05/03/2019   BUN 16 05/03/2019   NA 140 05/03/2019   K 4.0 05/03/2019   CL 105 05/03/2019   CO2 23 05/03/2019   Lab Results  Component Value Date   ALT 14 05/03/2019   AST 15 05/03/2019     ALKPHOS 99 05/03/2019   BILITOT 0.3 05/03/2019   Lab Results  Component Value Date   HGBA1C 5.0 05/03/2019   HGBA1C 5.1 01/12/2019   HGBA1C 4.9 09/08/2018   HGBA1C 5.1 03/05/2018   HGBA1C 5.0 12/02/2017   Lab Results  Component Value Date   INSULIN 5.1 01/12/2019   INSULIN 20.0 09/08/2018   INSULIN 19.7 03/05/2018   INSULIN 20.3 12/02/2017   INSULIN 42.7 (H) 09/02/2017   Lab Results  Component Value Date   TSH 2.320 09/08/2018   Lab Results  Component Value Date   CHOL 148 09/08/2018   HDL 36 (L) 09/08/2018   LDLCALC 93 09/08/2018   LDLDIRECT 118 (H) 06/24/2017   TRIG 94 09/08/2018   CHOLHDL 3.5 06/30/2015   Lab Results  Component Value Date   WBC 6.5 05/03/2019   HGB 14.9 05/03/2019   HCT 43.6 05/03/2019   MCV 88 05/03/2019   PLT 272 05/03/2019   Lab Results  Component Value Date   IRON 101 06/30/2015   TIBC 360 06/30/2015   FERRITIN 56 06/30/2015   Attestation Statements:   Reviewed by clinician on day of visit: allergies, medications, problem list, medical history, surgical history, family history, social history, and previous encounter notes.   Wilhemena Durie, am acting as Location manager for [employer], FNP-C.  I have reviewed the above documentation for accuracy and completeness, and I agree with the above. -  Georgianne Fick, FNP

## 2019-06-30 ENCOUNTER — Encounter (INDEPENDENT_AMBULATORY_CARE_PROVIDER_SITE_OTHER): Payer: Self-pay | Admitting: Family Medicine

## 2019-06-30 ENCOUNTER — Other Ambulatory Visit: Payer: Self-pay

## 2019-06-30 ENCOUNTER — Ambulatory Visit (INDEPENDENT_AMBULATORY_CARE_PROVIDER_SITE_OTHER): Payer: Medicaid Other | Admitting: Family Medicine

## 2019-06-30 VITALS — BP 118/78 | HR 73 | Temp 98.5°F | Ht 64.0 in | Wt 286.0 lb

## 2019-06-30 DIAGNOSIS — Z6841 Body Mass Index (BMI) 40.0 and over, adult: Secondary | ICD-10-CM | POA: Diagnosis not present

## 2019-06-30 DIAGNOSIS — E8881 Metabolic syndrome: Secondary | ICD-10-CM | POA: Diagnosis not present

## 2019-06-30 DIAGNOSIS — F418 Other specified anxiety disorders: Secondary | ICD-10-CM | POA: Diagnosis not present

## 2019-06-30 MED ORDER — BUPROPION HCL ER (SR) 150 MG PO TB12: 150.0000 mg | ORAL_TABLET | Freq: Every day | ORAL | 0 refills | Status: DC

## 2019-06-30 MED ORDER — ESCITALOPRAM OXALATE 10 MG PO TABS: ORAL_TABLET | ORAL | 0 refills | Status: DC

## 2019-06-30 MED ORDER — METFORMIN HCL 500 MG PO TABS: 500.0000 mg | ORAL_TABLET | Freq: Two times a day (BID) | ORAL | 0 refills | Status: DC

## 2019-06-30 NOTE — Progress Notes (Signed)
Chief Complaint:   OBESITY Kelly Fry is here to discuss her progress with her obesity treatment plan along with follow-up of her obesity related diagnoses. Kelly Fry is on the Category 2 Plan and states she is following her eating plan approximately 85% of the time. Kelly Fry states she is doing 0 minutes 0 times per week.  Today's visit was #: 45 Starting weight: 293 lbs Starting date: 09/02/2017 Today's weight: 286 lbs Today's date: 06/30/2019 Total lbs lost to date: 7 Total lbs lost since last in-office visit: 0  Interim History: Kelly Fry notes she is suffering with back pain.  She has stuck to the plan fairly well. She is eating all of the food, but she must split dinner portion up. She is not drinking sugar sweetened beverages, and she denies extra snacking. I am unsure why weight loss is not progressing better.   Subjective:   1. Depression with anxiety Kelly Fry notes stress eating is well controlled. Her mood is stable on bupropion, Topamax, and Lexapro. She would lie to discontinue Topamax. She does not feel that she needs it.   2. Insulin resistance Kelly Fry has a diagnosis of insulin resistance based on her elevated fasting insulin level >5. She is on metformin and denies polyphagia. She continues to work on diet and exercise to decrease her risk of diabetes.  Lab Results  Component Value Date   INSULIN 5.1 01/12/2019   INSULIN 20.0 09/08/2018   INSULIN 19.7 03/05/2018   INSULIN 20.3 12/02/2017   INSULIN 42.7 (H) 09/02/2017   Lab Results  Component Value Date   HGBA1C 5.0 05/03/2019   Assessment/Plan:   1. Depression with anxiety Behavior modification techniques were discussed today to help Reizel deal with her emotional/non-hunger eating behaviors. Tiairra agreed to discontinue Topamax, and we will refill bupropion and Lexapro for 1 month.  - buPROPion (WELLBUTRIN SR) 150 MG 12 hr tablet; Take 1 tablet (150 mg total) by mouth daily.  Dispense: 30 tablet; Refill: 0 -  escitalopram (LEXAPRO) 10 MG tablet; TAKE 1 TABLET BY MOUTH ONCE DAILY IN THE MORNING  Dispense: 30 tablet; Refill: 0  2. Insulin resistance Kelly Fry will continue to work on weight loss, exercise, and decreasing simple carbohydrates to help decrease the risk of diabetes. We will refill metformin for 1 month. Kelly Fry agreed to follow-up with [place] as directed to closely monitor her progress.  - metFORMIN (GLUCOPHAGE) 500 MG tablet; Take 1 tablet (500 mg total) by mouth 2 (two) times daily with a meal.  Dispense: 60 tablet; Refill: 0  3. Class 3 severe obesity with serious comorbidity and body mass index (BMI) of 45.0 to 49.9 in adult, unspecified obesity type ([facility]) Kelly Fry is currently in the action stage of change. As such, her goal is to continue with weight loss efforts. She has agreed to the Category 2 Plan or following a lower carbohydrate, vegetable and lean protein rich diet plan.   Exercise goals: All adults should avoid inactivity. Some physical activity is better than none, and adults who participate in any amount of physical activity gain some health benefits.  Behavioral modification strategies: meal planning and cooking strategies.  Kelly Fry has agreed to follow-up with our clinic in 3 weeks. She was informed of the importance of frequent follow-up visits to maximize her success with intensive lifestyle modifications for her multiple health conditions.   Objective:   Blood pressure 118/78, pulse 73, temperature 98.5 F (36.9 C), temperature source Oral, height 5\' 4"  (1.626 m), weight 286 lb (129.7 kg),  SpO2 98 %. Body mass index is 49.09 kg/m.  General: Cooperative, alert, well developed, in no acute distress. HEENT: Conjunctivae and lids unremarkable. Cardiovascular: Regular rhythm.  Lungs: Normal work of breathing. Neurologic: No focal deficits.   Lab Results  Component Value Date   CREATININE 1.03 (H) 05/03/2019   BUN 16 05/03/2019   NA 140 05/03/2019   K 4.0 05/03/2019   CL  105 05/03/2019   CO2 23 05/03/2019   Lab Results  Component Value Date   ALT 14 05/03/2019   AST 15 05/03/2019   ALKPHOS 99 05/03/2019   BILITOT 0.3 05/03/2019   Lab Results  Component Value Date   HGBA1C 5.0 05/03/2019   HGBA1C 5.1 01/12/2019   HGBA1C 4.9 09/08/2018   HGBA1C 5.1 03/05/2018   HGBA1C 5.0 12/02/2017   Lab Results  Component Value Date   INSULIN 5.1 01/12/2019   INSULIN 20.0 09/08/2018   INSULIN 19.7 03/05/2018   INSULIN 20.3 12/02/2017   INSULIN 42.7 (H) 09/02/2017   Lab Results  Component Value Date   TSH 2.320 09/08/2018   Lab Results  Component Value Date   CHOL 148 09/08/2018   HDL 36 (L) 09/08/2018   LDLCALC 93 09/08/2018   LDLDIRECT 118 (H) 06/24/2017   TRIG 94 09/08/2018   CHOLHDL 3.5 06/30/2015   Lab Results  Component Value Date   WBC 6.5 05/03/2019   HGB 14.9 05/03/2019   HCT 43.6 05/03/2019   MCV 88 05/03/2019   PLT 272 05/03/2019   Lab Results  Component Value Date   IRON 101 06/30/2015   TIBC 360 06/30/2015   FERRITIN 56 06/30/2015   Attestation Statements:   Reviewed by clinician on day of visit: allergies, medications, problem list, medical history, surgical history, family history, social history, and previous encounter notes.   Wilhemena Durie, am acting as Location manager for [employer], FNP-C.  I have reviewed the above documentation for accuracy and completeness, and I agree with the above. -  Georgianne Fick, FNP

## 2019-07-21 ENCOUNTER — Other Ambulatory Visit: Payer: Self-pay

## 2019-07-21 ENCOUNTER — Encounter (INDEPENDENT_AMBULATORY_CARE_PROVIDER_SITE_OTHER): Payer: Self-pay | Admitting: Family Medicine

## 2019-07-21 ENCOUNTER — Ambulatory Visit (INDEPENDENT_AMBULATORY_CARE_PROVIDER_SITE_OTHER): Payer: Medicaid Other | Admitting: Family Medicine

## 2019-07-21 VITALS — BP 118/74 | HR 93 | Temp 98.5°F | Ht 64.0 in | Wt 287.0 lb

## 2019-07-21 DIAGNOSIS — F3289 Other specified depressive episodes: Secondary | ICD-10-CM | POA: Diagnosis not present

## 2019-07-21 DIAGNOSIS — E559 Vitamin D deficiency, unspecified: Secondary | ICD-10-CM | POA: Diagnosis not present

## 2019-07-21 DIAGNOSIS — Z6841 Body Mass Index (BMI) 40.0 and over, adult: Secondary | ICD-10-CM | POA: Diagnosis not present

## 2019-07-21 MED ORDER — BUPROPION HCL ER (SR) 150 MG PO TB12: 150.0000 mg | ORAL_TABLET | Freq: Every day | ORAL | 0 refills | Status: DC

## 2019-07-21 MED ORDER — ESCITALOPRAM OXALATE 10 MG PO TABS: ORAL_TABLET | ORAL | 0 refills | Status: DC

## 2019-07-21 MED ORDER — VITAMIN D (ERGOCALCIFEROL) 1.25 MG (50000 UNIT) PO CAPS: 50000.0000 [IU] | ORAL_CAPSULE | ORAL | 0 refills | Status: DC

## 2019-07-21 NOTE — Progress Notes (Addendum)
Chief Complaint:   OBESITY Kelly Fry is here to discuss her progress with her obesity treatment plan along with follow-up of her obesity related diagnoses. Kelly Fry is on the Category 2 Plan or following a lower carbohydrate, vegetable and lean protein rich diet plan and states she is following her eating plan approximately 85% of the time. Kelly Fry states she is doing 0 minutes 0 times per week.  Today's visit was #: 79 Starting weight: 293 lbs Starting date: 09/02/2017 Today's weight: 287 lbs Today's date: 07/21/2019 Total lbs lost to date: 6 Total lbs lost since last in-office visit: 0  Interim History: Kelly Fry is dealing with severe left sciatica. She reports she is sticking to the Low carbohydrate plan but she is not losing any weight. She denies drinking sugar sweetened beverages, eating large portions, or eating extra carb calories.  Subjective:   1. Vitamin D deficiency Kelly Fry's last Vit D level was low at 53.9. She is on prescription Vit D.  2. Other depression, with emotional eating Kelly Fry denies excessive cravings. She notes she has felt slightly "down" lately but she is now feeling better.  Assessment/Plan:   1. Vitamin D deficiency Low Vitamin D level contributes to fatigue and are associated with obesity, breast, and colon cancer. We will refill prescription Vitamin D for 1 month. Kelly Fry will follow-up for routine testing of Vitamin D, at least 2-3 times per year to avoid over-replacement.  - Vitamin D, Ergocalciferol, (DRISDOL) 1.25 MG (50000 UNIT) CAPS capsule; Take 1 capsule (50,000 Units total) by mouth every 7 (seven) days.  Dispense: 4 capsule; Refill: 0  2. Other depression, with emotional eating Behavior modification techniques were discussed today to help Kelly Fry deal with her emotional/non-hunger eating behaviors. We will refill bupropion and Lexapro for 1 month. Orders and follow up as documented in patient record.   - buPROPion (WELLBUTRIN SR) 150 MG 12 hr  tablet; Take 1 tablet (150 mg total) by mouth daily.  Dispense: 30 tablet; Refill: 0 - escitalopram (LEXAPRO) 10 MG tablet; TAKE 1 TABLET BY MOUTH ONCE DAILY IN THE MORNING  Dispense: 30 tablet; Refill: 0  3. Class 3 severe obesity with serious comorbidity and body mass index (BMI) of 45.0 to 49.9 in adult, unspecified obesity type ([facility]) Kelly Fry is currently in the action stage of change. As such, her goal is to continue with weight loss efforts. She has agreed to the Category 2 Plan or following a lower carbohydrate, vegetable and lean protein rich diet plan.   Kelly Fry was advised to call her primary care physician regarding her back pain.  Exercise goals: No exercise has been prescribed at this time.  Behavioral modification strategies: increasing lean protein intake and meal planning and cooking strategies.  Kelly Fry has agreed to follow-up with our clinic in 2 to 3 weeks. She was informed of the importance of frequent follow-up visits to maximize her success with intensive lifestyle modifications for her multiple health conditions.   Objective:   Blood pressure 118/74, pulse 93, temperature 98.5 F (36.9 C), temperature source Oral, height 5\' 4"  (1.626 m), weight 287 lb (130.2 kg), SpO2 96 %. Body mass index is 49.26 kg/m.  General: Cooperative, alert, well developed, in no acute distress. HEENT: Conjunctivae and lids unremarkable. Cardiovascular: Regular rhythm.  Lungs: Normal work of breathing. Neurologic: No focal deficits.   Lab Results  Component Value Date   CREATININE 1.03 (H) 05/03/2019   BUN 16 05/03/2019   NA 140 05/03/2019   K 4.0 05/03/2019  CL 105 05/03/2019   CO2 23 05/03/2019   Lab Results  Component Value Date   ALT 14 05/03/2019   AST 15 05/03/2019   ALKPHOS 99 05/03/2019   BILITOT 0.3 05/03/2019   Lab Results  Component Value Date   HGBA1C 5.0 05/03/2019   HGBA1C 5.1 01/12/2019   HGBA1C 4.9 09/08/2018   HGBA1C 5.1 03/05/2018   HGBA1C 5.0 12/02/2017     Lab Results  Component Value Date   INSULIN 5.1 01/12/2019   INSULIN 20.0 09/08/2018   INSULIN 19.7 03/05/2018   INSULIN 20.3 12/02/2017   INSULIN 42.7 (H) 09/02/2017   Lab Results  Component Value Date   TSH 2.320 09/08/2018   Lab Results  Component Value Date   CHOL 148 09/08/2018   HDL 36 (L) 09/08/2018   LDLCALC 93 09/08/2018   LDLDIRECT 118 (H) 06/24/2017   TRIG 94 09/08/2018   CHOLHDL 3.5 06/30/2015   Lab Results  Component Value Date   WBC 6.5 05/03/2019   HGB 14.9 05/03/2019   HCT 43.6 05/03/2019   MCV 88 05/03/2019   PLT 272 05/03/2019   Lab Results  Component Value Date   IRON 101 06/30/2015   TIBC 360 06/30/2015   FERRITIN 56 06/30/2015   Attestation Statements:   Reviewed by clinician on day of visit: allergies, medications, problem list, medical history, surgical history, family history, social history, and previous encounter notes.   Wilhemena Durie, am acting as Location manager for [employer], FNP-C.  I have reviewed the above documentation for accuracy and completeness, and I agree with the above. -  Georgianne Fick, FNP

## 2019-07-22 ENCOUNTER — Encounter (INDEPENDENT_AMBULATORY_CARE_PROVIDER_SITE_OTHER): Payer: Self-pay | Admitting: Family Medicine

## 2019-08-10 ENCOUNTER — Encounter (INDEPENDENT_AMBULATORY_CARE_PROVIDER_SITE_OTHER): Payer: Self-pay | Admitting: Family Medicine

## 2019-08-10 ENCOUNTER — Ambulatory Visit (INDEPENDENT_AMBULATORY_CARE_PROVIDER_SITE_OTHER): Payer: Medicaid Other | Admitting: Family Medicine

## 2019-08-10 ENCOUNTER — Other Ambulatory Visit: Payer: Self-pay

## 2019-08-10 VITALS — BP 126/76 | HR 83 | Temp 98.4°F | Ht 64.0 in | Wt 289.0 lb

## 2019-08-10 DIAGNOSIS — Z6841 Body Mass Index (BMI) 40.0 and over, adult: Secondary | ICD-10-CM

## 2019-08-10 DIAGNOSIS — E8881 Metabolic syndrome: Secondary | ICD-10-CM | POA: Diagnosis not present

## 2019-08-10 DIAGNOSIS — F3289 Other specified depressive episodes: Secondary | ICD-10-CM | POA: Diagnosis not present

## 2019-08-10 MED ORDER — ESCITALOPRAM OXALATE 10 MG PO TABS: ORAL_TABLET | ORAL | 0 refills | Status: DC

## 2019-08-11 ENCOUNTER — Encounter (INDEPENDENT_AMBULATORY_CARE_PROVIDER_SITE_OTHER): Payer: Self-pay | Admitting: Family Medicine

## 2019-08-11 NOTE — Progress Notes (Signed)
Chief Complaint:   OBESITY Kelly Fry is here to discuss her progress with her obesity treatment plan along with follow-up of her obesity related diagnoses. Kelly Fry is on the Category 2 Plan or following a lower carbohydrate, vegetable and lean protein rich diet plan and states she is following her eating plan approximately 60% of the time. Kelly Fry states she is doing 0 minutes 0 times per week.  Today's visit was #: 67 Starting weight: 293 lbs Starting date: 09/02/2017 Today's weight: 289 lbs Today's date: 08/10/2019 Total lbs lost to date: 4 Total lbs lost since last in-office visit: 0  Interim History: Kelly Fry is skipping meals - sometimes 2 out of 3 meals per day. She is not drinking soda which was previously a problem. We discussed bariatric surgery after she asked about it.  Subjective:   1. Insulin resistance Kelly Fry has a diagnosis of insulin resistance based on her elevated fasting insulin level >5. She lacks appetite. She is on metformin BID. She continues to work on diet and exercise to decrease her risk of diabetes.  Lab Results  Component Value Date   INSULIN 5.1 01/12/2019   INSULIN 20.0 09/08/2018   INSULIN 19.7 03/05/2018   INSULIN 20.3 12/02/2017   INSULIN 42.7 (H) 09/02/2017   Lab Results  Component Value Date   HGBA1C 5.0 05/03/2019   2. Other depression, with emotional eating Kelly Fry notes her mood is stable on Lexapro 10 mg. She denies cravings.  Assessment/Plan:   1. Insulin resistance Kelly Fry will continue to work on weight loss, exercise, and decreasing simple carbohydrates to help decrease the risk of diabetes. Gean agreed to discontinue metformin. Kelly Fry agreed to follow-up with [place] as directed to closely monitor her progress.  2. Other depression, with emotional eating Behavior modification techniques were discussed today to help Kelly Fry deal with her emotional/non-hunger eating behaviors. Kelly Fry agreed to continue bupropion and we will refill Lexapro  for 1 month. Orders and follow up as documented in patient record.   - escitalopram (LEXAPRO) 10 MG tablet; TAKE 1 TABLET BY MOUTH ONCE DAILY IN THE MORNING  Dispense: 30 tablet; Refill: 0  3. Class 3 severe obesity with serious comorbidity and body mass index (BMI) of 45.0 to 49.9 in adult, unspecified obesity type (HCC) Kelly Fry is currently in the action stage of change. As such, her goal is to continue with weight loss efforts. She has agreed to the Category 1 Plan.   Information was provided to sign up for bariatric surgery seminar [facility] Surgery seminar). Patient was advised that bariatric surgery is a tool for weight loss and will not be successful long-term without significant dietary changes.  Exercise goals: All adults should avoid inactivity. Some physical activity is better than none, and adults who participate in any amount of physical activity gain some health benefits.  Behavioral modification strategies: increasing lean protein intake and no skipping meals.  Kelly Fry has agreed to follow-up with our clinic in 4 weeks. She was informed of the importance of frequent follow-up visits to maximize her success with intensive lifestyle modifications for her multiple health conditions.   Objective:   Blood pressure 126/76, pulse 83, temperature 98.4 F (36.9 C), temperature source Oral, height 5\' 4"  (1.626 m), weight 289 lb (131.1 kg), SpO2 98 %. Body mass index is 49.61 kg/m.  General: Cooperative, alert, well developed, in no acute distress. HEENT: Conjunctivae and lids unremarkable. Cardiovascular: Regular rhythm.  Lungs: Normal work of breathing. Neurologic: No focal deficits.   Lab  Results  Component Value Date   CREATININE 1.03 (H) 05/03/2019   BUN 16 05/03/2019   NA 140 05/03/2019   K 4.0 05/03/2019   CL 105 05/03/2019   CO2 23 05/03/2019   Lab Results  Component Value Date   ALT 14 05/03/2019   AST 15 05/03/2019   ALKPHOS 99 05/03/2019    BILITOT 0.3 05/03/2019   Lab Results  Component Value Date   HGBA1C 5.0 05/03/2019   HGBA1C 5.1 01/12/2019   HGBA1C 4.9 09/08/2018   HGBA1C 5.1 03/05/2018   HGBA1C 5.0 12/02/2017   Lab Results  Component Value Date   INSULIN 5.1 01/12/2019   INSULIN 20.0 09/08/2018   INSULIN 19.7 03/05/2018   INSULIN 20.3 12/02/2017   INSULIN 42.7 (H) 09/02/2017   Lab Results  Component Value Date   TSH 2.320 09/08/2018   Lab Results  Component Value Date   CHOL 148 09/08/2018   HDL 36 (L) 09/08/2018   LDLCALC 93 09/08/2018   LDLDIRECT 118 (H) 06/24/2017   TRIG 94 09/08/2018   CHOLHDL 3.5 06/30/2015   Lab Results  Component Value Date   WBC 6.5 05/03/2019   HGB 14.9 05/03/2019   HCT 43.6 05/03/2019   MCV 88 05/03/2019   PLT 272 05/03/2019   Lab Results  Component Value Date   IRON 101 06/30/2015   TIBC 360 06/30/2015   FERRITIN 56 06/30/2015   Attestation Statements:   Reviewed by clinician on day of visit: allergies, medications, problem list, medical history, surgical history, family history, social history, and previous encounter notes.   Wilhemena Durie, am acting as Location manager for [employer], FNP-C.  I have reviewed the above documentation for accuracy and completeness, and I agree with the above. -  Georgianne Fick, FNP

## 2019-08-31 ENCOUNTER — Encounter (INDEPENDENT_AMBULATORY_CARE_PROVIDER_SITE_OTHER): Payer: Self-pay | Admitting: Family Medicine

## 2019-09-01 NOTE — Telephone Encounter (Signed)
Please review pt of Kelly Fry

## 2019-09-07 ENCOUNTER — Encounter (INDEPENDENT_AMBULATORY_CARE_PROVIDER_SITE_OTHER): Payer: Self-pay | Admitting: Family Medicine

## 2019-09-07 ENCOUNTER — Ambulatory Visit (INDEPENDENT_AMBULATORY_CARE_PROVIDER_SITE_OTHER): Payer: Medicaid Other | Admitting: Family Medicine

## 2019-09-07 ENCOUNTER — Other Ambulatory Visit: Payer: Self-pay

## 2019-09-07 VITALS — BP 118/81 | HR 68 | Temp 98.3°F | Ht 64.0 in | Wt 289.0 lb

## 2019-09-07 DIAGNOSIS — Z6841 Body Mass Index (BMI) 40.0 and over, adult: Secondary | ICD-10-CM

## 2019-09-07 DIAGNOSIS — F3289 Other specified depressive episodes: Secondary | ICD-10-CM | POA: Diagnosis not present

## 2019-09-07 DIAGNOSIS — E559 Vitamin D deficiency, unspecified: Secondary | ICD-10-CM | POA: Diagnosis not present

## 2019-09-07 MED ORDER — VITAMIN D (ERGOCALCIFEROL) 1.25 MG (50000 UNIT) PO CAPS: 50000.0000 [IU] | ORAL_CAPSULE | ORAL | 0 refills | Status: DC

## 2019-09-07 MED ORDER — ESCITALOPRAM OXALATE 10 MG PO TABS: ORAL_TABLET | ORAL | 0 refills | Status: DC

## 2019-09-07 NOTE — Progress Notes (Signed)
Chief Complaint:   OBESITY Kelly Fry is here to discuss her progress with her obesity treatment plan along with follow-up of her obesity related diagnoses. Kelly Fry is on the Category 1 Plan and states she is following her eating plan approximately 50% of the time. Kelly Fry states she is doing 0 minutes 0 times per week.  Today's visit was #: 56 Starting weight: 293 lbs Starting date: 09/02/2017 Today's weight: 289 lbs Today's date: 09/07/2019 Total lbs lost to date: 4 Total lbs lost since last in-office visit: 0  Interim History: Kelly Fry is struggling with depression due to her youngest daughter starting school. She is skipping meals - usually lunch. She admits to snacking and having occasional sodas.  She is beginning the process of preparation for weight loss surgery through [facility].  Subjective:   1. Vitamin D deficiency Kelly Fry's last Vit D level was low at 47. She is on Vit D prescription.  2. Other depression, with emotional eating Kelly Fry notes skipping meals due to depression. This is related to her youngest daughter starting school. 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 She is on Lexapro 10 mg and bupropion 150 mg q AM. She denies suicidal ideas or homicidal ideas.  Assessment/Plan:   1. Vitamin D deficiency Low Vitamin D level contributes to fatigue and are associated with obesity, breast, and colon cancer. We will refill prescription Vitamin D for 1 month. Kamirah will follow-up for routine testing of Vitamin D, at least 2-3 times per year to avoid over-replacement.  - Vitamin D, Ergocalciferol, (DRISDOL) 1.25 MG (50000 UNIT) CAPS capsule; Take 1 capsule (50,000 Units total) by mouth every 7 (seven) days.  Dispense: 4 capsule; Refill: 0  2. Other depression, with emotional eating Behavior modification techniques were discussed today to help Willo deal with her emotional/non-hunger eating behaviors. We will refill Lexapro for 1 month, and she will continue  bupropion. Orders and follow up as documented in patient record.   - escitalopram (LEXAPRO) 10 MG tablet; TAKE 1 TABLET BY MOUTH ONCE DAILY IN THE MORNING  Dispense: 30 tablet; Refill: 0  3. Class 3 severe obesity with serious comorbidity and body mass index (BMI) of 45.0 to 49.9 in adult, unspecified obesity type (HCC) Jeidi is currently in the action stage of change. As such, her goal is to continue with weight loss efforts. She has agreed to the Category 1 Plan.   Exercise goals: All adults should avoid inactivity. Some physical activity is better than none, and adults who participate in any amount of physical activity gain some health benefits.  Behavioral modification strategies: increasing lean protein intake and no skipping meals.  Decie has agreed to follow-up with our clinic in 3 weeks. She was informed of the importance of frequent follow-up visits to maximize her success with intensive lifestyle modifications for her multiple health conditions.   Objective:   Blood pressure 118/81, pulse 68, temperature 98.3 F (36.8 C), temperature source Oral, height 5\' 4"  (1.626 m), weight (!) 289 lb (131.1 kg), SpO2 97 %. Body mass index is 49.61 kg/m.  General: Cooperative, alert, well developed, in no acute distress. HEENT: Conjunctivae and lids unremarkable. Cardiovascular: Regular rhythm.  Lungs: Normal work of breathing. Neurologic: No focal deficits.   Lab Results  Component Value Date   CREATININE 1.03 (H) 05/03/2019   BUN 16 05/03/2019   NA 140 05/03/2019   K 4.0 05/03/2019   CL 105 05/03/2019   CO2 23 05/03/2019   Lab Results  Component Value Date   ALT 14 05/03/2019   AST 15 05/03/2019   ALKPHOS 99 05/03/2019   BILITOT 0.3 05/03/2019   Lab Results  Component Value Date   HGBA1C 5.0 05/03/2019   HGBA1C 5.1 01/12/2019   HGBA1C 4.9 09/08/2018   HGBA1C 5.1 03/05/2018   HGBA1C 5.0 12/02/2017   Lab Results  Component Value Date   INSULIN 5.1 01/12/2019    INSULIN 20.0 09/08/2018   INSULIN 19.7 03/05/2018   INSULIN 20.3 12/02/2017   INSULIN 42.7 (H) 09/02/2017   Lab Results  Component Value Date   TSH 2.320 09/08/2018   Lab Results  Component Value Date   CHOL 148 09/08/2018   HDL 36 (L) 09/08/2018   LDLCALC 93 09/08/2018   LDLDIRECT 118 (H) 06/24/2017   TRIG 94 09/08/2018   CHOLHDL 3.5 06/30/2015   Lab Results  Component Value Date   WBC 6.5 05/03/2019   HGB 14.9 05/03/2019   HCT 43.6 05/03/2019   MCV 88 05/03/2019   PLT 272 05/03/2019   Lab Results  Component Value Date   IRON 101 06/30/2015   TIBC 360 06/30/2015   FERRITIN 56 06/30/2015   Attestation Statements:   Reviewed by clinician on day of visit: allergies, medications, problem list, medical history, surgical history, family history, social history, and previous encounter notes.   Wilhemena Durie, am acting as Location manager for [employer], FNP-C.  I have reviewed the above documentation for accuracy and completeness, and I agree with the above. -  Georgianne Fick, FNP

## 2019-09-14 ENCOUNTER — Encounter: Payer: Self-pay | Admitting: Nurse Practitioner

## 2019-09-14 ENCOUNTER — Other Ambulatory Visit: Payer: Self-pay

## 2019-09-14 ENCOUNTER — Ambulatory Visit: Payer: Medicaid Other | Admitting: Nurse Practitioner

## 2019-09-14 VITALS — BP 132/87 | HR 84 | Temp 98.0°F | Resp 20 | Ht 64.0 in | Wt 294.4 lb

## 2019-09-14 DIAGNOSIS — Z6841 Body Mass Index (BMI) 40.0 and over, adult: Secondary | ICD-10-CM

## 2019-09-14 DIAGNOSIS — K2100 Gastro-esophageal reflux disease with esophagitis, without bleeding: Secondary | ICD-10-CM | POA: Diagnosis not present

## 2019-09-14 NOTE — Assessment & Plan Note (Signed)
Patient is a 34 year old female who presents today for referral to bariatric surgery.  Patient has a BMI of 50.53 kg/M, 294 LB 6 ounces.  Patient reports she has tried everything known to her to lose weight and now mentally and physically ready to have bariatric surgery.  Patient is reporting increased tiredness and more fatigue.  Patient is not reporting any fevers, nausea and joint aches. Referral to bariatric surgery completed.  Provided education to patient with printed handouts given. Patient to follow-up as needed.

## 2019-09-14 NOTE — Progress Notes (Addendum)
Established Patient Office Visit  Subjective:  Patient ID: Kelly Fry, female    DOB: 1985-04-21  Age: 34 y.o. MRN: [id number]  CC:  Chief Complaint  Patient presents with  . Referral for bariatric surgery    HPI Kelly Fry is a 34 year old patient who presents for referral to bariatric surgery.  BMI 50.53 kg/M, 294 lb 6oz, patient reports she has tried everything known to her to lose weight and now mentally and physically ready to have bariatric surgery.  Patient is reporting increased tiredness and more fatigue. Patient is not reporting any fevers, nausea, and joint aches.  Morbid obesity due to excess calories   She follows a low calorie diet. Compliance with treatment has been fair;  maintains her diet and exercise as best as she knows to.  No current weight loss medications.  The patient has not lost any weight since last visit and is requesting Bariatric surgery consultation.   Gastroesophageal Reflux  Concerning patient's GERD, she is following up today reporting no new or worsening symptoms of gastroesophageal reflux.  This is not new for patient in was first diagnosed in February 2021.  Patient is not currently using any medication but reports that symptoms are well controlled.  Past Medical History:  Diagnosis Date  . Ankle instability, right   . Anxiety   . Back pain   . Bipolar disorder ([place])   . Complication of anesthesia   . Depression   . Dyspnea   . Family history of adverse reaction to anesthesia    Dad had n/v post anesthesia  . GERD (gastroesophageal reflux disease)   . Leg edema   . Medical history non-contributory   . Miscarriage   . PONV (postoperative nausea and vomiting)   . Vitamin D deficiency     Past Surgical History:  Procedure Laterality Date  . ANKLE RECONSTRUCTION Right 02/21/2017   Procedure: RIGHT LATERAL ANKLE RECONSTRUCTION WITH SPLIT PERONEAL LONGUS TENDON;  Surgeon: Marybelle Killings, MD;  Location: [place];  Service:  Orthopedics;  Laterality: Right;  . NO PAST SURGERIES    . WISDOM TOOTH EXTRACTION  04/12/2015    Family History  Problem Relation Age of Onset  . Kidney disease Father   . COPD Father   . Hypertension Father   . Depression Father   . Sleep apnea Father   . Obesity Father   . Kidney disease Sister   . Asthma Sister   . Early death Mother   . Stroke Mother   . Heart disease Mother   . Hypertension Mother   . Alcoholism Mother   . Asthma Brother     Social History   Socioeconomic History  . Marital status: Married    Spouse name: Kimber Relic  . Number of children: 2  . Years of education: Not on file  . Highest education level: Not on file  Occupational History  . Occupation: Stay at [employer] Use  . Smoking status: Never Smoker  . Smokeless tobacco: Never Used  Vaping Use  . Vaping Use: Never used  Substance and Sexual Activity  . Alcohol use: No  . Drug use: No  . Sexual activity: Yes    Birth control/protection: I.U.D.  Other Topics Concern  . Not on file  Social History Narrative  . Not on file   Social Determinants of Health   Financial Resource Strain:   . Difficulty of Paying Living Expenses:   Food Insecurity:   . Worried  About Running Out of Food in the Last Year:   . [place] in the Last Year:   Transportation Needs:   . Lack of Transportation (Medical):   [name] Lack of Transportation (Non-Medical):   Physical Activity:   . Days of Exercise per Week:   . Minutes of Exercise per Session:   Stress:   . Feeling of Stress :   Social Connections:   . Frequency of Communication with Friends and Family:   . Frequency of Social Gatherings with Friends and Family:   . Attends Religious Services:   . Active Member of Clubs or Organizations:   . Attends Archivist Meetings:   [name] Marital Status:   Intimate Partner Violence:   . Fear of Current or Ex-Partner:   . Emotionally Abused:   [name] Physically Abused:   . Sexually Abused:       Outpatient Medications Prior to Visit  Medication Sig Dispense Refill  . albuterol (PROVENTIL HFA;VENTOLIN HFA) 108 (90 Base) MCG/ACT inhaler Inhale 2 puffs into the lungs every 6 (six) hours as needed for wheezing or shortness of breath. 1 Inhaler 2  . buPROPion (WELLBUTRIN SR) 150 MG 12 hr tablet Take 1 tablet (150 mg total) by mouth daily. 30 tablet 0  . escitalopram (LEXAPRO) 10 MG tablet TAKE 1 TABLET BY MOUTH ONCE DAILY IN THE MORNING 30 tablet 0  . fluticasone (FLONASE) 50 MCG/ACT nasal spray Place 2 sprays into both nostrils daily. 16 g 2  . Vitamin D, Ergocalciferol, (DRISDOL) 1.25 MG (50000 UNIT) CAPS capsule Take 1 capsule (50,000 Units total) by mouth every 7 (seven) days. 4 capsule 0  . hydrochlorothiazide (MICROZIDE) 12.5 MG capsule Take 1 capsule by mouth once daily (Patient not taking: Reported on 09/14/2019) 90 capsule 0   No facility-administered medications prior to visit.    Allergies  Allergen Reactions  . Latuda [Lurasidone] Other (See Comments)    Shaky; worsened mood    ROS Review of Systems  Constitutional: Negative.   Respiratory: Negative.   Cardiovascular: Negative.   Gastrointestinal: Negative.   Genitourinary: Negative.   Musculoskeletal: Negative.   Skin: Negative.   Neurological: Negative.   Psychiatric/Behavioral: The patient is not nervous/anxious.       Objective:    Physical Exam Constitutional:      Appearance: Normal appearance. She is obese.  HENT:     Head: Normocephalic.  Eyes:     Conjunctiva/sclera: Conjunctivae normal.  Cardiovascular:     Rate and Rhythm: Normal rate and regular rhythm.     Pulses: Normal pulses.     Heart sounds: Normal heart sounds.  Pulmonary:     Effort: Pulmonary effort is normal.     Breath sounds: Normal breath sounds.  Abdominal:     General: Bowel sounds are normal.  Musculoskeletal:        General: No swelling or tenderness.     Cervical back: Neck supple.  Skin:    General: Skin is warm.   Neurological:     Mental Status: She is alert and oriented to person, place, and time.  Psychiatric:        Mood and Affect: Mood normal.     BP 132/87   Pulse 84   Temp 98 F (36.7 C) (Temporal)   Resp 20   Ht 5\' 4"  (1.626 m)   Wt 294 lb 6 oz (133.5 kg)   SpO2 95%   BMI 50.53 kg/m  Wt Readings from Last 3  Encounters:  09/14/19 294 lb 6 oz (133.5 kg)  09/07/19 (!) 289 lb (131.1 kg)  08/10/19 289 lb (131.1 kg)     Health Maintenance Due  Topic Date Due  . Hepatitis C Screening  Never done  . COVID-19 Vaccine (1) Never done  . PAP SMEAR-Modifier  06/30/2018    There are no preventive care reminders to display for this patient.  Lab Results  Component Value Date   TSH 2.320 09/08/2018   Lab Results  Component Value Date   WBC 6.5 05/03/2019   HGB 14.9 05/03/2019   HCT 43.6 05/03/2019   MCV 88 05/03/2019   PLT 272 05/03/2019   Lab Results  Component Value Date   NA 140 05/03/2019   K 4.0 05/03/2019   CO2 23 05/03/2019   GLUCOSE 81 05/03/2019   BUN 16 05/03/2019   CREATININE 1.03 (H) 05/03/2019   BILITOT 0.3 05/03/2019   ALKPHOS 99 05/03/2019   AST 15 05/03/2019   ALT 14 05/03/2019   PROT 6.9 05/03/2019   ALBUMIN 4.5 05/03/2019   CALCIUM 9.3 05/03/2019   ANIONGAP 9 02/19/2017   Lab Results  Component Value Date   CHOL 148 09/08/2018   Lab Results  Component Value Date   HDL 36 (L) 09/08/2018   Lab Results  Component Value Date   LDLCALC 93 09/08/2018   Lab Results  Component Value Date   TRIG 94 09/08/2018   Lab Results  Component Value Date   CHOLHDL 3.5 06/30/2015   Lab Results  Component Value Date   HGBA1C 5.0 05/03/2019      Assessment & Plan:   Problem List Items Addressed This Visit      Digestive   Gastroesophageal reflux disease    Patient is following up today for gastroesophageal reflux disease, symptoms well managed no changes to current plan.  Patient is not currently using any medication but reports that symptoms  are well controlled.         Other   Morbid obesity with BMI of 45.0-49.9, adult [facility]) - Primary    Patient is a 34 year old female who presents today for referral to bariatric surgery.  Patient has a BMI of 50.53 kg/M, 294 LB 6 ounces.  Patient reports she has tried everything known to her to lose weight and now mentally and physically ready to have bariatric surgery.  Patient is reporting increased tiredness and more fatigue.  Patient is not reporting any fevers, nausea and joint aches. Referral to bariatric surgery completed.  Provided education to patient with printed handouts given. Patient to follow-up as needed.      Relevant Orders   Amb Referral to Bariatric Surgery      No orders of the defined types were placed in this encounter.   Follow-up: Return if symptoms worsen or fail to improve.    Ivy Lynn, NP

## 2019-09-14 NOTE — Addendum Note (Signed)
Addended by: Ivy Lynn on: 09/14/2019 10:07 AM   Modules accepted: Level of Service

## 2019-09-14 NOTE — Assessment & Plan Note (Signed)
Patient is following up today for gastroesophageal reflux disease, symptoms well managed no changes to current plan.  Patient is not currently using any medication but reports that symptoms are well controlled.

## 2019-09-14 NOTE — Patient Instructions (Addendum)
Gastroesophageal reflux disease Patient is following up today for gastroesophageal reflux disease, symptoms well managed no changes to current plan.  Patient is not currently using any medication but reports that symptoms are well controlled.   Morbid obesity with BMI of 45.0-49.9, adult [facility]) Patient is a 34 year old female who presents today for referral to bariatric surgery.  Patient has a BMI of 50.53 kg/M, 294 LB 6 ounces.  Patient reports she has tried everything known to her to lose weight and now mentally and physically ready to have bariatric surgery.  Patient is reporting increased tiredness and more fatigue.  Patient is not reporting any fevers, nausea and joint aches. Referral to bariatric surgery completed.  Provided education to patient with printed handouts given. Patient to follow-up as needed.    Gastroesophageal Reflux Disease, Adult Gastroesophageal reflux (GER) happens when acid from the stomach flows up into the tube that connects the mouth and the stomach (esophagus). Normally, food travels down the esophagus and stays in the stomach to be digested. With GER, food and stomach acid sometimes move back up into the esophagus. You may have a disease called gastroesophageal reflux disease (GERD) if the reflux:  Happens often.  Causes frequent or very bad symptoms.  Causes problems such as damage to the esophagus. When this happens, the esophagus becomes sore and swollen (inflamed). Over time, GERD can make small holes (ulcers) in the lining of the esophagus. What are the causes? This condition is caused by a problem with the muscle between the esophagus and the stomach. When this muscle is weak or not normal, it does not close properly to keep food and acid from coming back up from the stomach. The muscle can be weak because of:  Tobacco use.  Pregnancy.  Having a certain type of hernia (hiatal hernia).  Alcohol use.  Certain foods and drinks, such as coffee,  chocolate, onions, and peppermint. What increases the risk? You are more likely to develop this condition if you:  Are overweight.  Have a disease that affects your connective tissue.  Use NSAID medicines. What are the signs or symptoms? Symptoms of this condition include:  Heartburn.  Difficult or painful swallowing.  The feeling of having a lump in the throat.  A bitter taste in the mouth.  Bad breath.  Having a lot of saliva.  Having an upset or bloated stomach.  Belching.  Chest pain. Different conditions can cause chest pain. Make sure you see your doctor if you have chest pain.  Shortness of breath or noisy breathing (wheezing).  Ongoing (chronic) cough or a cough at night.  Wearing away of the surface of teeth (tooth enamel).  Weight loss. How is this treated? Treatment will depend on how bad your symptoms are. Your doctor may suggest:  Changes to your diet.  Medicine.  Surgery. Follow these instructions at home: Eating and drinking   Follow a diet as told by your doctor. You may need to avoid foods and drinks such as: ? Coffee and tea (with or without caffeine). ? Drinks that contain alcohol. ? Energy drinks and sports drinks. ? Bubbly (carbonated) drinks or sodas. ? Chocolate and cocoa. ? Peppermint and mint flavorings. ? Garlic and onions. ? Horseradish. ? Spicy and acidic foods. These include peppers, chili powder, curry powder, vinegar, hot sauces, and BBQ sauce. ? Citrus fruit juices and citrus fruits, such as oranges, lemons, and limes. ? Tomato-based foods. These include red sauce, chili, salsa, and pizza with red sauce. ? [place]  and fatty foods. These include donuts, french fries, potato chips, and high-fat dressings. ? High-fat meats. These include hot dogs, rib eye steak, sausage, ham, and bacon. ? High-fat dairy items, such as whole milk, butter, and cream cheese.  Eat small meals often. Avoid eating large meals.  Avoid drinking  large amounts of liquid with your meals.  Avoid eating meals during the 2-3 hours before bedtime.  Avoid lying down right after you eat.  Do not exercise right after you eat. Lifestyle   Do not use any products that contain nicotine or tobacco. These include cigarettes, e-cigarettes, and chewing tobacco. If you need help quitting, ask your doctor.  Try to lower your stress. If you need help doing this, ask your doctor.  If you are overweight, lose an amount of weight that is healthy for you. Ask your doctor about a safe weight loss goal. General instructions  Pay attention to any changes in your symptoms.  Take over-the-counter and prescription medicines only as told by your doctor. Do not take aspirin, ibuprofen, or other NSAIDs unless your doctor says it is okay.  Wear loose clothes. Do not wear anything tight around your waist.  Raise (elevate) the head of your bed about 6 inches (15 cm).  Avoid bending over if this makes your symptoms worse.  Keep all follow-up visits as told by your doctor. This is important. Contact a doctor if:  You have new symptoms.  You lose weight and you do not know why.  You have trouble swallowing or it hurts to swallow.  You have wheezing or a cough that keeps happening.  Your symptoms do not get better with treatment.  You have a hoarse voice. Get help right away if:  You have pain in your arms, neck, jaw, teeth, or back.  You feel sweaty, dizzy, or light-headed.  You have chest pain or shortness of breath.  You throw up (vomit) and your throw-up looks like blood or coffee grounds.  You pass out (faint).  Your poop (stool) is bloody or black.  You cannot swallow, drink, or eat. Summary  If a person has gastroesophageal reflux disease (GERD), food and stomach acid move back up into the esophagus and cause symptoms or problems such as damage to the esophagus.  Treatment will depend on how bad your symptoms are.  Follow a  diet as told by your doctor.  Take all medicines only as told by your doctor. This information is not intended to replace advice given to you by your health care provider. Make sure you discuss any questions you have with your health care provider. Document Revised: 08/06/2017 Document Reviewed: 08/06/2017 Elsevier Patient Education  [place]. Bariatric Surgery Information Bariatric surgery, also called weight loss surgery, is a procedure that helps you lose weight. You may consider, or your health care provider may suggest, bariatric surgery if:  You are severely obese and have been unable to lose weight through diet and exercise.  You have health problems related to obesity, such as: ? Type 2 diabetes. ? Heart disease. ? Lung disease. How does bariatric surgery help me lose weight? Bariatric surgery helps you lose weight by:  Decreasing how much food your body absorbs. This is done by closing off part of your stomach to make it smaller. This restricts the amount of food your stomach can hold.  Changing your body's regular digestive process so that food bypasses the parts of your body that absorb calories and nutrients. If you decide  to have bariatric surgery, it is important to continue to eat a healthy diet and exercise regularly after the surgery. What are the different kinds of bariatric surgery? There are two kinds of bariatric surgeries:  Restrictive surgery. This procedure makes your stomach smaller. It does not change your digestive process. The smaller the size of your new stomach, the less food you can eat. There are different types of restrictive surgeries.  Malabsorptive surgery. This procedure makes your stomach smaller and alters your digestive process so that your body processes less calories and nutrients. These are the most common kind of bariatric surgery. There are different types of malabsorptive surgeries. What are the different types of restrictive  surgery? Adjustable Gastric Banding In this procedure, an inflatable band is placed around your stomach near the upper end. This makes the passageway for food into the rest of your stomach much smaller. The band can be adjusted, making it tighter or looser, by filling it with salt solution. Your surgeon can adjust the band based on how you are feeling and how much weight you are losing. The band can be removed in the future. This requires another surgery. Sleeve Gastrectomy In this procedure, your stomach is made smaller. This is done by surgically removing a large part of your stomach. When your stomach is smaller, you feel full more quickly and reduce how much you eat. What are the different types of malabsorptive surgery?  Roux-en-Y Gastric Bypass (RGB) This is the most common weight loss surgery. In this procedure, a small stomach pouch (gastric pouch) is created in the upper part of your stomach. Next, this gastric pouch is attached directly to the middle part of your small intestine. The farther down your small intestine the new connection is made, the fewer calories and nutrients you will absorb. This surgery has the highest rate of complications. Biliopancreatic Diversion with Duodenal Switch (BPD/DS) This is a multi-step procedure. First, a large part of your stomach is removed, making your stomach smaller. Next, this smaller stomach is attached to the lower part of your small intestine. Like the RGB surgery, you absorb fewer calories and nutrients the farther down your small intestine the attachment is made. What are the risks of bariatric surgery? As with any surgical procedure, each type of bariatric surgery has its own risks. These risks also depend on your age, your overall health, and any other medical conditions you may have. When deciding on bariatric surgery, it is very important to:  Talk to your health care provider and choose the surgery that is best for you.  Ask your health  care provider about specific risks for the surgery you choose. Generally, the risks of bariatric surgery include:  Infection.  Bleeding.  Not getting enough nutrients from food (nutritional deficiencies).  Failure of the device or procedure. This may require another surgery to correct the problem. Where to find more information  American Society for Metabolic & Bariatric Surgery: www.asmbs.org  Weight-control Information Network (WIN): win.AmenCredit.is Summary  Bariatric surgery, also called weight loss surgery, is a procedure that helps you lose weight.  This surgery may be recommended if you have diabetes, heart disease, or lung disease.  Generally, risks of bariatric surgery include infection, bleeding, and failure of the surgery or device, which may require another surgery to correct the problem. This information is not intended to replace advice given to you by your health care provider. Make sure you discuss any questions you have with your health care provider. Document Revised:  05/19/2018 Document Reviewed: 03/04/2016 Elsevier Patient Education  [place].

## 2019-09-28 ENCOUNTER — Other Ambulatory Visit: Payer: Self-pay

## 2019-09-28 ENCOUNTER — Ambulatory Visit (INDEPENDENT_AMBULATORY_CARE_PROVIDER_SITE_OTHER): Payer: Medicaid Other | Admitting: Family Medicine

## 2019-09-28 ENCOUNTER — Encounter (INDEPENDENT_AMBULATORY_CARE_PROVIDER_SITE_OTHER): Payer: Self-pay | Admitting: Family Medicine

## 2019-09-28 VITALS — BP 123/77 | HR 68 | Temp 98.9°F | Ht 64.0 in | Wt 290.0 lb

## 2019-09-28 DIAGNOSIS — F3289 Other specified depressive episodes: Secondary | ICD-10-CM

## 2019-09-28 DIAGNOSIS — Z6841 Body Mass Index (BMI) 40.0 and over, adult: Secondary | ICD-10-CM

## 2019-09-28 DIAGNOSIS — E559 Vitamin D deficiency, unspecified: Secondary | ICD-10-CM | POA: Diagnosis not present

## 2019-09-28 MED ORDER — ESCITALOPRAM OXALATE 20 MG PO TABS: 20.0000 mg | ORAL_TABLET | Freq: Every day | ORAL | 0 refills | Status: DC

## 2019-09-28 MED ORDER — VITAMIN D (ERGOCALCIFEROL) 1.25 MG (50000 UNIT) PO CAPS: 50000.0000 [IU] | ORAL_CAPSULE | ORAL | 0 refills | Status: DC

## 2019-09-28 NOTE — Progress Notes (Signed)
Chief Complaint:   OBESITY Kelly Fry is here to discuss her progress with her obesity treatment plan along with follow-up of her obesity related diagnoses. Kelly Fry is on the Category 1 Plan and states she is following her eating plan approximately 50% of the time. Kelly Fry states she is doing 0 minutes 0 times per week.  Today's visit was #: 52 Starting weight: 293 lbs Starting date: 09/02/2017 Today's weight: 290 lbs Today's date: 09/28/2019 Total lbs lost to date: 3 Total lbs lost since last in-office visit: 0  Interim History: Kelly Fry is scheduled to see a surgeon for consultation for weight loss surgery at [facility] on August 30th.  She notes she is stress eating at night. Her boyfriend may quit his job and this is causing stress. She notes eating too many carbohydrates at night. She is now drinking 1-2 sodas per day. She had cut out sodas previously. .  Subjective:   1. Vitamin D deficiency Nioma's Vit D level is slightly low at 42.2. She is on prescription Vit D.  2. Other depression, with emotional eating Maki notes issues with sleep which have been ongoing for several months. She notes increased stress, but she denies depression.  Assessment/Plan:   1. Vitamin D deficiency Low Vitamin D level contributes to fatigue and are associated with obesity, breast, and colon cancer. We will refill prescription Vitamin D for 1 month. Kelly Fry will follow-up for routine testing of Vitamin D, at least 2-3 times per year to avoid over-replacement.  - Vitamin D, Ergocalciferol, (DRISDOL) 1.25 MG (50000 UNIT) CAPS capsule; Take 1 capsule (50,000 Units total) by mouth every 7 (seven) days.  Dispense: 4 capsule; Refill: 0  2. Other depression, with emotional eating Behavior modification techniques were discussed today to help Kelly Fry deal with her emotional/non-hunger eating behaviors. Kelly Fry agreed to discontinue bupropion to see if it is interfering with sleep. We will increase Lexapro to 20 mg q  daily and we will refill for 1 month. Orders and follow up as documented in patient record.   - escitalopram (LEXAPRO) 20 MG tablet; Take 1 tablet (20 mg total) by mouth daily.  Dispense: 30 tablet; Refill: 0  3. Class 3 severe obesity with serious comorbidity and body mass index (BMI) of 45.0 to 49.9 in adult, unspecified obesity type (HCC) Kelly Fry is currently in the action stage of change. As such, her goal is to continue with weight loss efforts. She has agreed to the Category 1 Plan.   Exercise goals: No exercise has been prescribed at this time.  Behavioral modification strategies: decreasing simple carbohydrates and decreasing liquid calories.  Kelly Fry has agreed to follow-up with our clinic in 4 weeks. She was informed of the importance of frequent follow-up visits to maximize her success with intensive lifestyle modifications for her multiple health conditions.   Objective:   Blood pressure 123/77, pulse 68, temperature 98.9 F (37.2 C), temperature source Oral, height 5\' 4"  (1.626 m), weight 290 lb (131.5 kg), SpO2 98 %. Body mass index is 49.78 kg/m.  General: Cooperative, alert, well developed, in no acute distress. HEENT: Conjunctivae and lids unremarkable. Cardiovascular: Regular rhythm.  Lungs: Normal work of breathing. Neurologic: No focal deficits.   Lab Results  Component Value Date   CREATININE 1.03 (H) 05/03/2019   BUN 16 05/03/2019   NA 140 05/03/2019   K 4.0 05/03/2019   CL 105 05/03/2019   CO2 23 05/03/2019   Lab Results  Component Value Date   ALT 14 05/03/2019  AST 15 05/03/2019   ALKPHOS 99 05/03/2019   BILITOT 0.3 05/03/2019   Lab Results  Component Value Date   HGBA1C 5.0 05/03/2019   HGBA1C 5.1 01/12/2019   HGBA1C 4.9 09/08/2018   HGBA1C 5.1 03/05/2018   HGBA1C 5.0 12/02/2017   Lab Results  Component Value Date   INSULIN 5.1 01/12/2019   INSULIN 20.0 09/08/2018   INSULIN 19.7 03/05/2018   INSULIN 20.3 12/02/2017   INSULIN 42.7 (H)  09/02/2017   Lab Results  Component Value Date   TSH 2.320 09/08/2018   Lab Results  Component Value Date   CHOL 148 09/08/2018   HDL 36 (L) 09/08/2018   LDLCALC 93 09/08/2018   LDLDIRECT 118 (H) 06/24/2017   TRIG 94 09/08/2018   CHOLHDL 3.5 06/30/2015   Lab Results  Component Value Date   WBC 6.5 05/03/2019   HGB 14.9 05/03/2019   HCT 43.6 05/03/2019   MCV 88 05/03/2019   PLT 272 05/03/2019   Lab Results  Component Value Date   IRON 101 06/30/2015   TIBC 360 06/30/2015   FERRITIN 56 06/30/2015   Attestation Statements:   Reviewed by clinician on day of visit: allergies, medications, problem list, medical history, surgical history, family history, social history, and previous encounter notes.   Wilhemena Durie, am acting as Location manager for [employer], FNP-C.  I have reviewed the above documentation for accuracy and completeness, and I agree with the above. -  Georgianne Fick, [place] \

## 2019-10-11 DIAGNOSIS — E559 Vitamin D deficiency, unspecified: Secondary | ICD-10-CM | POA: Diagnosis not present

## 2019-10-11 DIAGNOSIS — Z6841 Body Mass Index (BMI) 40.0 and over, adult: Secondary | ICD-10-CM | POA: Diagnosis not present

## 2019-10-11 DIAGNOSIS — E8881 Metabolic syndrome: Secondary | ICD-10-CM | POA: Diagnosis not present

## 2019-10-11 DIAGNOSIS — R4589 Other symptoms and signs involving emotional state: Secondary | ICD-10-CM | POA: Diagnosis not present

## 2019-10-21 DIAGNOSIS — H5213 Myopia, bilateral: Secondary | ICD-10-CM | POA: Diagnosis not present

## 2019-10-22 DIAGNOSIS — E559 Vitamin D deficiency, unspecified: Secondary | ICD-10-CM | POA: Diagnosis not present

## 2019-10-22 DIAGNOSIS — R4589 Other symptoms and signs involving emotional state: Secondary | ICD-10-CM | POA: Diagnosis not present

## 2019-10-22 DIAGNOSIS — E8881 Metabolic syndrome: Secondary | ICD-10-CM | POA: Diagnosis not present

## 2019-10-25 ENCOUNTER — Other Ambulatory Visit: Payer: Self-pay

## 2019-10-25 ENCOUNTER — Ambulatory Visit (INDEPENDENT_AMBULATORY_CARE_PROVIDER_SITE_OTHER): Payer: Medicaid Other | Admitting: Family Medicine

## 2019-10-25 ENCOUNTER — Encounter (INDEPENDENT_AMBULATORY_CARE_PROVIDER_SITE_OTHER): Payer: Self-pay | Admitting: Family Medicine

## 2019-10-25 VITALS — BP 127/79 | HR 84 | Temp 98.5°F | Ht 64.0 in | Wt 292.0 lb

## 2019-10-25 DIAGNOSIS — F3289 Other specified depressive episodes: Secondary | ICD-10-CM

## 2019-10-25 DIAGNOSIS — Z6841 Body Mass Index (BMI) 40.0 and over, adult: Secondary | ICD-10-CM | POA: Diagnosis not present

## 2019-10-25 DIAGNOSIS — E559 Vitamin D deficiency, unspecified: Secondary | ICD-10-CM

## 2019-10-25 MED ORDER — ESCITALOPRAM OXALATE 20 MG PO TABS: 20.0000 mg | ORAL_TABLET | Freq: Every day | ORAL | 0 refills | Status: DC

## 2019-10-25 MED ORDER — VITAMIN D (ERGOCALCIFEROL) 1.25 MG (50000 UNIT) PO CAPS: 50000.0000 [IU] | ORAL_CAPSULE | ORAL | 0 refills | Status: DC

## 2019-10-26 ENCOUNTER — Encounter (INDEPENDENT_AMBULATORY_CARE_PROVIDER_SITE_OTHER): Payer: Self-pay | Admitting: Family Medicine

## 2019-10-26 NOTE — Progress Notes (Signed)
Chief Complaint:   OBESITY Jiya is here to discuss her progress with her obesity treatment plan along with follow-up of her obesity related diagnoses. Morgyn is on the Category 1 Plan and states she is following her eating plan approximately 75% of the time. Munachimso states she has increased walking.   Today's visit was #: 44 Starting weight: 293 lbs Starting date: 09/02/2017 Today's weight: 292 lbs Today's date: 10/25/2019 Total lbs lost to date: 1 Total lbs lost since last in-office visit: 0  Interim History: Jonny has seen Dr. Volanda [facility]) for a consult for bariatric surgery and she plans on proceeding with RNY gastric bypass. She has cut out soda completely. She has been snacking some during the day and is up 2 lbs today.  Subjective:   1. Vitamin D deficiency Shabria's Vit D level is at goal at 22. She is on prescription Vit D.   2. Other depression, with emotional eating Izzabelle notes extra stress due to multiple medical appointments, start of school, and new job. She notes she feels very "grouchy".  Assessment/Plan:   1. Vitamin D deficiency  We will refill prescription Vitamin D for 1 month.  - Vitamin D, Ergocalciferol, (DRISDOL) 1.25 MG (50000 UNIT) CAPS capsule; Take 1 capsule (50,000 Units total) by mouth every 7 (seven) days.  Dispense: 4 capsule; Refill: 0  2. Other depression, with emotional eating  We will refill Lexapro for 1 month.   - escitalopram (LEXAPRO) 20 MG tablet; Take 1 tablet (20 mg total) by mouth daily.  Dispense: 30 tablet; Refill: 0  3. Class 3 severe obesity with serious comorbidity and body mass index (BMI) of 50.0 to 59.9 in adult, unspecified obesity type (HCC) Aleila is currently in the action stage of change. As such, her goal is to continue with weight loss efforts. She has agreed to the Category 1 Plan.   Exercise goals: Increase walking to 3 times per week for 30 minutes.  Behavioral modification strategies: increasing lean  protein intake and decreasing simple carbohydrates.  We discussed that bariatric surgery is a tool for weight loss and will not be successful long-term without significant dietary changes and exercise.  Kionna has agreed to follow-up with our clinic in 4 weeks. She was informed of the importance of frequent follow-up visits to maximize her success with intensive lifestyle modifications for her multiple health conditions.   Objective:   Blood pressure 127/79, pulse 84, temperature 98.5 F (36.9 C), temperature source Oral, height 5\' 4"  (1.626 m), weight 292 lb (132.5 kg), SpO2 99 %. Body mass index is 50.12 kg/m.  General: Cooperative, alert, well developed, in no acute distress. HEENT: Conjunctivae and lids unremarkable. Cardiovascular: Regular rhythm.  Lungs: Normal work of breathing. Neurologic: No focal deficits.   Lab Results  Component Value Date   CREATININE 1.03 (H) 05/03/2019   BUN 16 05/03/2019   NA 140 05/03/2019   K 4.0 05/03/2019   CL 105 05/03/2019   CO2 23 05/03/2019   Lab Results  Component Value Date   ALT 14 05/03/2019   AST 15 05/03/2019   ALKPHOS 99 05/03/2019   BILITOT 0.3 05/03/2019   Lab Results  Component Value Date   HGBA1C 5.0 05/03/2019   HGBA1C 5.1 01/12/2019   HGBA1C 4.9 09/08/2018   HGBA1C 5.1 03/05/2018   HGBA1C 5.0 12/02/2017   Lab Results  Component Value Date   INSULIN 5.1 01/12/2019   INSULIN 20.0 09/08/2018   INSULIN 19.7 03/05/2018   INSULIN  20.3 12/02/2017   INSULIN 42.7 (H) 09/02/2017   Lab Results  Component Value Date   TSH 2.320 09/08/2018   Lab Results  Component Value Date   CHOL 148 09/08/2018   HDL 36 (L) 09/08/2018   LDLCALC 93 09/08/2018   LDLDIRECT 118 (H) 06/24/2017   TRIG 94 09/08/2018   CHOLHDL 3.5 06/30/2015   Lab Results  Component Value Date   WBC 6.5 05/03/2019   HGB 14.9 05/03/2019   HCT 43.6 05/03/2019   MCV 88 05/03/2019   PLT 272 05/03/2019   Lab Results  Component Value Date   IRON  101 06/30/2015   TIBC 360 06/30/2015   FERRITIN 56 06/30/2015   Attestation Statements:   Reviewed by clinician on day of visit: allergies, medications, problem list, medical history, surgical history, family history, social history, and previous encounter notes.   Wilhemena Durie, am acting as Location manager for [employer], FNP-C.  I have reviewed the above documentation for accuracy and completeness, and I agree with the above. -  Georgianne Fick, FNP

## 2019-11-05 DIAGNOSIS — Z6841 Body Mass Index (BMI) 40.0 and over, adult: Secondary | ICD-10-CM | POA: Diagnosis not present

## 2019-11-05 DIAGNOSIS — R0683 Snoring: Secondary | ICD-10-CM | POA: Diagnosis not present

## 2019-11-05 DIAGNOSIS — G4710 Hypersomnia, unspecified: Secondary | ICD-10-CM | POA: Diagnosis not present

## 2019-11-11 DIAGNOSIS — Z713 Dietary counseling and surveillance: Secondary | ICD-10-CM | POA: Diagnosis not present

## 2019-11-15 ENCOUNTER — Ambulatory Visit (INDEPENDENT_AMBULATORY_CARE_PROVIDER_SITE_OTHER): Payer: Medicaid Other | Admitting: Family

## 2019-11-15 ENCOUNTER — Encounter: Payer: Self-pay | Admitting: Family

## 2019-11-15 VITALS — BP 135/90 | HR 78 | Temp 97.5°F | Ht 64.0 in

## 2019-11-15 DIAGNOSIS — R0981 Nasal congestion: Secondary | ICD-10-CM | POA: Diagnosis not present

## 2019-11-15 DIAGNOSIS — J069 Acute upper respiratory infection, unspecified: Secondary | ICD-10-CM | POA: Diagnosis not present

## 2019-11-15 MED ORDER — DEXAMETHASONE 6 MG PO TABS: 6.0000 mg | ORAL_TABLET | Freq: Every day | ORAL | 0 refills | Status: DC

## 2019-11-15 NOTE — Patient Instructions (Signed)
Sinusitis, Adult Sinusitis is inflammation of your sinuses. Sinuses are hollow spaces in the bones around your face. Your sinuses are located:  Around your eyes.  In the middle of your forehead.  Behind your nose.  In your cheekbones. Mucus normally drains out of your sinuses. When your nasal tissues become inflamed or swollen, mucus can become trapped or blocked. This allows bacteria, viruses, and fungi to grow, which leads to infection. Most infections of the sinuses are caused by a virus. Sinusitis can develop quickly. It can last for up to 4 weeks (acute) or for more than 12 weeks (chronic). Sinusitis often develops after a cold. What are the causes? This condition is caused by anything that creates swelling in the sinuses or stops mucus from draining. This includes:  Allergies.  Asthma.  Infection from bacteria or viruses.  Deformities or blockages in your nose or sinuses.  Abnormal growths in the nose (nasal polyps).  Pollutants, such as chemicals or irritants in the air.  Infection from fungi (rare). What increases the risk? You are more likely to develop this condition if you:  Have a weak body defense system (immune system).  Do a lot of swimming or diving.  Overuse nasal sprays.  Smoke. What are the signs or symptoms? The main symptoms of this condition are pain and a feeling of pressure around the affected sinuses. Other symptoms include:  Stuffy nose or congestion.  Thick drainage from your nose.  Swelling and warmth over the affected sinuses.  Headache.  Upper toothache.  A cough that may get worse at night.  Extra mucus that collects in the throat or the back of the nose (postnasal drip).  Decreased sense of smell and taste.  Fatigue.  A fever.  Sore throat.  Bad breath. How is this diagnosed? This condition is diagnosed based on:  Your symptoms.  Your medical history.  A physical exam.  Tests to find out if your condition is  acute or chronic. This may include: ? Checking your nose for nasal polyps. ? Viewing your sinuses using a device that has a light (endoscope). ? Testing for allergies or bacteria. ? Imaging tests, such as an MRI or CT scan. In rare cases, a bone biopsy may be done to rule out more serious types of fungal sinus disease. How is this treated? Treatment for sinusitis depends on the cause and whether your condition is chronic or acute.  If caused by a virus, your symptoms should go away on their own within 10 days. You may be given medicines to relieve symptoms. They include: ? Medicines that shrink swollen nasal passages (topical intranasal decongestants). ? Medicines that treat allergies (antihistamines). ? A spray that eases inflammation of the nostrils (topical intranasal corticosteroids). ? Rinses that help get rid of thick mucus in your nose (nasal saline washes).  If caused by bacteria, your health care provider may recommend waiting to see if your symptoms improve. Most bacterial infections will get better without antibiotic medicine. You may be given antibiotics if you have: ? A severe infection. ? A weak immune system.  If caused by narrow nasal passages or nasal polyps, you may need to have surgery. Follow these instructions at home: Medicines  Take, use, or apply over-the-counter and prescription medicines only as told by your health care provider. These may include nasal sprays.  If you were prescribed an antibiotic medicine, take it as told by your health care provider. Do not stop taking the antibiotic even if you start   to feel better. Hydrate and humidify   Drink enough fluid to keep your urine pale yellow. Staying hydrated will help to thin your mucus.  Use a cool mist humidifier to keep the humidity level in your home above 50%.  Inhale steam for 10-15 minutes, 3-4 times a day, or as told by your health care provider. You can do this in the bathroom while a hot shower is  running.  Limit your exposure to cool or dry air. Rest  Rest as much as possible.  Sleep with your head raised (elevated).  Make sure you get enough sleep each night. General instructions   Apply a warm, moist washcloth to your face 3-4 times a day or as told by your health care provider. This will help with discomfort.  Wash your hands often with soap and water to reduce your exposure to germs. If soap and water are not available, use hand sanitizer.  Do not smoke. Avoid being around people who are smoking (secondhand smoke).  Keep all follow-up visits as told by your health care provider. This is important. Contact a health care provider if:  You have a fever.  Your symptoms get worse.  Your symptoms do not improve within 10 days. Get help right away if:  You have a severe headache.  You have persistent vomiting.  You have severe pain or swelling around your face or eyes.  You have vision problems.  You develop confusion.  Your neck is stiff.  You have trouble breathing. Summary  Sinusitis is soreness and inflammation of your sinuses. Sinuses are hollow spaces in the bones around your face.  This condition is caused by nasal tissues that become inflamed or swollen. The swelling traps or blocks the flow of mucus. This allows bacteria, viruses, and fungi to grow, which leads to infection.  If you were prescribed an antibiotic medicine, take it as told by your health care provider. Do not stop taking the antibiotic even if you start to feel better.  Keep all follow-up visits as told by your health care provider. This is important. This information is not intended to replace advice given to you by your health care provider. Make sure you discuss any questions you have with your health care provider. Document Revised: 06/30/2017 Document Reviewed: 06/30/2017 Elsevier Patient Education  2020 Elsevier Inc.  

## 2019-11-15 NOTE — Progress Notes (Signed)
Subjective:    Patient ID: Kelly Fry, female    DOB: 06-12-1985, 34 y.o.   MRN: [id number]  Chief Complaint  Patient presents with  . Nasal Congestion    started on 09/26  . Headache    Headache  Associated symptoms include coughing, ear pain and rhinorrhea. Pertinent negatives include no nausea or sore throat.  URI  This is a new problem. The current episode started in the past 7 days. The problem has been gradually worsening. There has been no fever. Associated symptoms include congestion, coughing, ear pain, headaches, rhinorrhea and sinus pain. Pertinent negatives include no diarrhea, nausea, plugged ear sensation, sore throat or wheezing. She has tried decongestant and antihistamine for the symptoms. The treatment provided mild relief.      Review of Systems  HENT: Positive for congestion, ear pain, rhinorrhea and sinus pain. Negative for sore throat.   Respiratory: Positive for cough. Negative for wheezing.   Gastrointestinal: Negative for diarrhea and nausea.  Neurological: Positive for headaches.  All other systems reviewed and are negative.      Objective:   Physical Exam Vitals reviewed.  Constitutional:      General: She is not in acute distress.    Appearance: She is well-developed. She is obese.  HENT:     Head: Normocephalic and atraumatic.     Right Ear: External ear normal.     Nose:     Right Sinus: Maxillary sinus tenderness present.     Left Sinus: Maxillary sinus tenderness present.  Eyes:     Pupils: Pupils are equal, round, and reactive to light.  Neck:     Thyroid: No thyromegaly.  Cardiovascular:     Rate and Rhythm: Normal rate and regular rhythm.     Heart sounds: Normal heart sounds. No murmur heard.   Pulmonary:     Effort: Pulmonary effort is normal. No respiratory distress.     Breath sounds: Normal breath sounds. No wheezing.     Comments: Intermittent nonproductive cough Abdominal:     General: Bowel sounds are normal. There  is no distension.     Palpations: Abdomen is soft.     Tenderness: There is no abdominal tenderness.  Musculoskeletal:        General: No tenderness. Normal range of motion.     Cervical back: Normal range of motion and neck supple.  Skin:    General: Skin is warm and dry.  Neurological:     Mental Status: She is alert and oriented to person, place, and time.     Cranial Nerves: No cranial nerve deficit.     Deep Tendon Reflexes: Reflexes are normal and symmetric.  Psychiatric:        Behavior: Behavior normal.        Thought Content: Thought content normal.        Judgment: Judgment normal.       BP 135/90   Pulse 78   Temp (!) 97.5 F (36.4 C) (Temporal)   Ht 5\' 4"  (1.626 m)   SpO2 100%   BMI 50.12 kg/m      Assessment & Plan:  Kelly Fry comes in today with chief complaint of Nasal Congestion (started on 09/26) and Headache   Diagnosis and orders addressed:  1. Nasal congestion - Novel Coronavirus, NAA ([facility])  2. Viral URI - Take meds as prescribed - Use a cool mist humidifier  -Use saline nose sprays frequently -Force fluids -For any cough or congestion  Use plain Mucinex- regular strength or max strength is fine -For fever or aces or pains- take tylenol or ibuprofen. -Throat lozenges if help -RTO if symptoms worsen or do not improve  - dexamethasone (DECADRON) 6 MG tablet; Take 1 tablet (6 mg total) by mouth daily.  Dispense: 5 tablet; Refill: 0    Kelly Dun, FNP

## 2019-11-16 ENCOUNTER — Ambulatory Visit (INDEPENDENT_AMBULATORY_CARE_PROVIDER_SITE_OTHER): Payer: Medicaid Other | Admitting: Family

## 2019-11-16 ENCOUNTER — Encounter: Payer: Self-pay | Admitting: Family

## 2019-11-16 VITALS — Ht 64.0 in | Wt 295.0 lb

## 2019-11-16 DIAGNOSIS — E88819 Insulin resistance, unspecified: Secondary | ICD-10-CM

## 2019-11-16 DIAGNOSIS — Z6841 Body Mass Index (BMI) 40.0 and over, adult: Secondary | ICD-10-CM

## 2019-11-16 DIAGNOSIS — M5442 Lumbago with sciatica, left side: Secondary | ICD-10-CM | POA: Diagnosis not present

## 2019-11-16 DIAGNOSIS — K2100 Gastro-esophageal reflux disease with esophagitis, without bleeding: Secondary | ICD-10-CM

## 2019-11-16 DIAGNOSIS — F411 Generalized anxiety disorder: Secondary | ICD-10-CM | POA: Diagnosis not present

## 2019-11-16 DIAGNOSIS — G8929 Other chronic pain: Secondary | ICD-10-CM | POA: Diagnosis not present

## 2019-11-16 DIAGNOSIS — E8881 Metabolic syndrome: Secondary | ICD-10-CM | POA: Diagnosis not present

## 2019-11-16 LAB — SARS-COV-2, NAA 2 DAY TAT

## 2019-11-16 LAB — NOVEL CORONAVIRUS, NAA: SARS-CoV-2, NAA: DETECTED — AB

## 2019-11-16 NOTE — Progress Notes (Signed)
Virtual Visit via telephone Note Due to COVID-19 pandemic this visit was conducted virtually. This visit type was conducted due to national recommendations for restrictions regarding the COVID-19 Pandemic (e.g. social distancing, sheltering in place) in an effort to limit this patient's exposure and mitigate transmission in our community. All issues noted in this document were discussed and addressed.  A physical exam was not performed with this format.  I connected with [name] on 11/16/19 at 1:03 pm by telephone and verified that I am speaking with the correct person using two identifiers. [name] is currently located at home and her children is currently with her during visit. The provider, Evelina Dun, FNP is located in their office at time of visit.  I discussed the limitations, risks, security and privacy concerns of performing an evaluation and management service by telephone and the availability of in person appointments. I also discussed with the patient that there may be a patient responsible charge related to this service. The patient expressed understanding and agreed to proceed.   History and Present Illness:  HPI  Pt calls the office today for bariatric surgery support letter to be completed. She has been a patient at our office since 12/21/14. She struggled with her weight over the last year. She has been going to the health weight and wellness for over the last year.   She walks occasionally walks, but does not do it scheduled. She has chronic back pain that hinders her exercising.   She has the following active problems,  Patient Active Problem List   Diagnosis Date Noted  . Seasonal allergies 05/19/2019  . Gastroesophageal reflux disease 04/07/2019  . Insulin resistance 11/19/2018  . Depression with anxiety 11/19/2018  . Class 3 severe obesity with serious comorbidity and body mass index (BMI) of 50.0 to 59.9 in adult ([place]) 11/19/2018  . Chronic  bilateral low back pain with left-sided sciatica 09/17/2018  . Chronic midline thoracic back pain 09/17/2018  . 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 Vitamin D deficiency 07/04/2015  . GAD (generalized anxiety disorder) 06/30/2015  . Depression 03/28/2015  . Morbid obesity with BMI of 45.0-49.9, adult ([place]) 12/21/2014  . Rh negative, antepartum 06/03/2013   These are stable, but could improve if she could lose weight.   Review of Systems  All other systems reviewed and are negative.    Observations/Objective: No SOB or distress noted   Assessment and Plan:  KENNITA PAVLOVICH comes in today with chief complaint of No chief complaint on file.   Diagnosis and orders addressed:  1. Morbid obesity with BMI of 45.0-49.9, adult ([place])  2. GAD (generalized anxiety disorder)  3. Chronic bilateral low back pain with left-sided sciatica  4. Gastroesophageal reflux disease with esophagitis without hemorrhage  5. Insulin resistance  6. Class 3 severe obesity with serious comorbidity and body mass index (BMI) of 50.0 to 59.9 in adult, unspecified obesity type [facility])  Will complete Bariatric surgery support letter Encourage healthy diet Encouraged at least 3060 mins of exercise a day Keep follow up with specialists     I discussed the assessment and treatment plan with the patient. The patient was provided an opportunity to ask questions and all were answered. The patient agreed with the plan and demonstrated an understanding of the instructions.   The patient was advised to call back or seek an in-person evaluation if the symptoms worsen or if the condition fails to improve as  anticipated.  The above assessment and management plan was discussed with the patient. The patient verbalized understanding of and has agreed to the management plan. Patient is aware to call the clinic if symptoms persist or worsen. Patient is aware when to return to the  clinic for a follow-up visit. Patient educated on when it is appropriate to go to the emergency department.   Time call ended: 1:20 pm  I provided 17 minutes of non-face-to-face time during this encounter.    Evelina Dun, FNP

## 2019-11-17 ENCOUNTER — Telehealth: Payer: Self-pay | Admitting: Internal Medicine

## 2019-11-17 NOTE — Telephone Encounter (Signed)
Called to Discuss with patient about Covid symptoms and the use of the monoclonal antibody infusion for those with mild to moderate Covid symptoms and at a high risk of hospitalization.     Pt is qualified for this infusion due to co-morbid conditions and/or a member of an at-risk group.  BMI 50.    Patient Active Problem List   Diagnosis Date Noted  . Seasonal allergies 05/19/2019  . Gastroesophageal reflux disease 04/07/2019  . Insulin resistance 11/19/2018  . Depression with anxiety 11/19/2018  . Class 3 severe obesity with serious comorbidity and body mass index (BMI) of 50.0 to 59.9 in adult ([place]) 11/19/2018  . Chronic bilateral low back pain with left-sided sciatica 09/17/2018  . Chronic midline thoracic back pain 09/17/2018  . 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 Vitamin D deficiency 07/04/2015  . GAD (generalized anxiety disorder) 06/30/2015  . Depression 03/28/2015  . Morbid obesity with BMI of 45.0-49.9, adult ([place]) 12/21/2014  . Rh negative, antepartum 06/03/2013    Patient declines infusion at this time. Symptoms tier reviewed as well as criteria for ending isolation. Preventative practices reviewed. Patient verbalized understanding.    Patient advised to call back if he/she decides that he/she does want to get infusion. Callback number to the infusion center given. Patient advised to go to Urgent care or ED with severe symptoms. Last day eligible for infusion would be 10/8.   Alan Ripper, [place]

## 2019-11-23 ENCOUNTER — Other Ambulatory Visit: Payer: Self-pay

## 2019-11-23 ENCOUNTER — Encounter (INDEPENDENT_AMBULATORY_CARE_PROVIDER_SITE_OTHER): Payer: Self-pay | Admitting: Family Medicine

## 2019-11-23 ENCOUNTER — Telehealth (INDEPENDENT_AMBULATORY_CARE_PROVIDER_SITE_OTHER): Payer: Medicaid Other | Admitting: Family Medicine

## 2019-11-23 DIAGNOSIS — Z6841 Body Mass Index (BMI) 40.0 and over, adult: Secondary | ICD-10-CM

## 2019-11-23 DIAGNOSIS — F3289 Other specified depressive episodes: Secondary | ICD-10-CM

## 2019-11-23 DIAGNOSIS — E559 Vitamin D deficiency, unspecified: Secondary | ICD-10-CM | POA: Diagnosis not present

## 2019-11-23 MED ORDER — VITAMIN D (ERGOCALCIFEROL) 1.25 MG (50000 UNIT) PO CAPS: 50000.0000 [IU] | ORAL_CAPSULE | ORAL | 0 refills | Status: DC

## 2019-11-23 MED ORDER — ESCITALOPRAM OXALATE 20 MG PO TABS: 20.0000 mg | ORAL_TABLET | Freq: Every day | ORAL | 0 refills | Status: DC

## 2019-11-25 ENCOUNTER — Encounter (INDEPENDENT_AMBULATORY_CARE_PROVIDER_SITE_OTHER): Payer: Self-pay | Admitting: Family Medicine

## 2019-11-25 NOTE — Progress Notes (Signed)
TeleHealth Visit:  Due to the COVID-19 pandemic, this visit was completed with telemedicine (audio/video) technology to reduce patient and provider exposure as well as to preserve personal protective equipment.   Kelly Fry has verbally consented to this TeleHealth visit. The patient is located at home, the provider is located at the [facility] office. The participants in this visit include the listed provider. The visit was conducted today via video.  Chief Complaint: OBESITY Kelly Fry is here to discuss her progress with her obesity treatment plan along with follow-up of her obesity related diagnoses. Kelly Fry is on the Category 1 Plan and states she is following her eating plan approximately 50% of the time. Kelly Fry states she is exercising 0 minutes 0 times per week.  Today's visit was #: 44 Starting weight: 293 lbs Starting date: 09/02/2017  Interim History: Kelly Fry is at home with a mild case of COVID. She is in the process of getting approved for bariatric surgery with [facility]. She is still not drinking soda, which is a victory for her. She has not had an appetite due to COVID and is eating twice daily. She is having protein with these meals. She denies snacking on simple carbs.  Subjective:   Other depression, with emotional eating.   Kelly Fry denies depression at this time. She has been diagnosed with bipolar in the past. She will be establishing with a therapist for her prep for bariatric surgery.  Vitamin D deficiency. Last Vitamin D level was at goal at 53.9 on 05/03/2019. Kelly Fry is on weekly prescription Vitamin D supplementation.    Ref. Range 05/03/2019 12:50  Vitamin D, 25-Hydroxy Latest Ref Range: 30.0 - 100.0 ng/mL 53.9   Assessment/Plan:   Other depression, with emotional eating. Refill was given for escitalopram (LEXAPRO) 20 MG tablet #30 with 0 refill. A list of therapists from her [facility] psychological evaluation was sent to the patient via  [employer].  Vitamin D deficiency. . She was given a refill on her Vitamin D, Ergocalciferol, (DRISDOL) 1.25 MG (50000 UNIT) CAPS capsule every week #4 with 0 refills.  Class 3 severe obesity with serious comorbidity and body mass index (BMI) of 50.0 to 59.9 in adult, unspecified obesity type (Kelly Fry).  Kelly Fry is currently in the action stage of change. As such, her goal is to continue with weight loss efforts. She has agreed to the Category 1 Plan.  She will have a protein shake for breakfast and have 2 meals per day.  Exercise goals: All adults should avoid inactivity. Some physical activity is better than none, and adults who participate in any amount of physical activity gain some health benefits.  Behavioral modification strategies: increasing lean protein intake and no skipping meals.  Kelly Fry has agreed to follow-up with our clinic in 3 weeks.  Objective:   VITALS: Per patient if applicable, see vitals. GENERAL: Alert and in no acute distress. CARDIOPULMONARY: No increased WOB. Speaking in clear sentences.  PSYCH: Pleasant and cooperative. Speech normal rate and rhythm. Affect is appropriate. Insight and judgement are appropriate. Attention is focused, linear, and appropriate.  NEURO: Oriented as arrived to appointment on time with no prompting.   Lab Results  Component Value Date   CREATININE 1.03 (H) 05/03/2019   BUN 16 05/03/2019   NA 140 05/03/2019   K 4.0 05/03/2019   CL 105 05/03/2019   CO2 23 05/03/2019   Lab Results  Component Value Date   ALT 14 05/03/2019   AST 15 05/03/2019  ALKPHOS 99 05/03/2019   BILITOT 0.3 05/03/2019   Lab Results  Component Value Date   HGBA1C 5.0 05/03/2019   HGBA1C 5.1 01/12/2019   HGBA1C 4.9 09/08/2018   HGBA1C 5.1 03/05/2018   HGBA1C 5.0 12/02/2017   Lab Results  Component Value Date   INSULIN 5.1 01/12/2019   INSULIN 20.0 09/08/2018   INSULIN 19.7 03/05/2018   INSULIN 20.3 12/02/2017   INSULIN 42.7 (H) 09/02/2017   Lab  Results  Component Value Date   TSH 2.320 09/08/2018   Lab Results  Component Value Date   CHOL 148 09/08/2018   HDL 36 (L) 09/08/2018   LDLCALC 93 09/08/2018   LDLDIRECT 118 (H) 06/24/2017   TRIG 94 09/08/2018   CHOLHDL 3.5 06/30/2015   Lab Results  Component Value Date   WBC 6.5 05/03/2019   HGB 14.9 05/03/2019   HCT 43.6 05/03/2019   MCV 88 05/03/2019   PLT 272 05/03/2019   Lab Results  Component Value Date   IRON 101 06/30/2015   TIBC 360 06/30/2015   FERRITIN 56 06/30/2015   Attestation Statements:   Reviewed by clinician on day of visit: allergies, medications, problem list, medical history, surgical history, family history, social history, and previous encounter notes.  IMichaelene Song, am acting as Location manager for [employer], FNP-C   I have reviewed the above documentation for accuracy and completeness, and I agree with the above. - Georgianne Fick, FNP

## 2019-12-07 DIAGNOSIS — F319 Bipolar disorder, unspecified: Secondary | ICD-10-CM | POA: Insufficient documentation

## 2019-12-07 DIAGNOSIS — E8881 Metabolic syndrome: Secondary | ICD-10-CM | POA: Diagnosis not present

## 2019-12-07 DIAGNOSIS — E559 Vitamin D deficiency, unspecified: Secondary | ICD-10-CM | POA: Diagnosis not present

## 2019-12-10 DIAGNOSIS — R0683 Snoring: Secondary | ICD-10-CM | POA: Diagnosis not present

## 2019-12-10 DIAGNOSIS — G4710 Hypersomnia, unspecified: Secondary | ICD-10-CM | POA: Diagnosis not present

## 2019-12-13 DIAGNOSIS — E8881 Metabolic syndrome: Secondary | ICD-10-CM | POA: Insufficient documentation

## 2019-12-13 DIAGNOSIS — Z6841 Body Mass Index (BMI) 40.0 and over, adult: Secondary | ICD-10-CM | POA: Diagnosis not present

## 2019-12-13 DIAGNOSIS — F319 Bipolar disorder, unspecified: Secondary | ICD-10-CM | POA: Diagnosis not present

## 2019-12-13 DIAGNOSIS — E559 Vitamin D deficiency, unspecified: Secondary | ICD-10-CM | POA: Diagnosis not present

## 2019-12-13 DIAGNOSIS — Z136 Encounter for screening for cardiovascular disorders: Secondary | ICD-10-CM | POA: Diagnosis not present

## 2019-12-14 ENCOUNTER — Ambulatory Visit (INDEPENDENT_AMBULATORY_CARE_PROVIDER_SITE_OTHER): Payer: Medicaid Other | Admitting: Family Medicine

## 2019-12-22 DIAGNOSIS — G4733 Obstructive sleep apnea (adult) (pediatric): Secondary | ICD-10-CM | POA: Diagnosis not present

## 2019-12-22 DIAGNOSIS — Z6841 Body Mass Index (BMI) 40.0 and over, adult: Secondary | ICD-10-CM | POA: Diagnosis not present

## 2019-12-22 DIAGNOSIS — G4710 Hypersomnia, unspecified: Secondary | ICD-10-CM | POA: Diagnosis not present

## 2019-12-23 DIAGNOSIS — E8881 Metabolic syndrome: Secondary | ICD-10-CM | POA: Diagnosis not present

## 2019-12-23 DIAGNOSIS — F319 Bipolar disorder, unspecified: Secondary | ICD-10-CM | POA: Diagnosis not present

## 2019-12-23 DIAGNOSIS — E781 Pure hyperglyceridemia: Secondary | ICD-10-CM | POA: Insufficient documentation

## 2019-12-23 DIAGNOSIS — G4733 Obstructive sleep apnea (adult) (pediatric): Secondary | ICD-10-CM | POA: Diagnosis not present

## 2019-12-23 DIAGNOSIS — E559 Vitamin D deficiency, unspecified: Secondary | ICD-10-CM | POA: Diagnosis not present

## 2019-12-29 DIAGNOSIS — G4733 Obstructive sleep apnea (adult) (pediatric): Secondary | ICD-10-CM | POA: Diagnosis not present

## 2019-12-29 DIAGNOSIS — E781 Pure hyperglyceridemia: Secondary | ICD-10-CM | POA: Diagnosis not present

## 2019-12-29 DIAGNOSIS — E8881 Metabolic syndrome: Secondary | ICD-10-CM | POA: Diagnosis not present

## 2019-12-29 DIAGNOSIS — Z713 Dietary counseling and surveillance: Secondary | ICD-10-CM | POA: Diagnosis not present

## 2020-02-15 DIAGNOSIS — E781 Pure hyperglyceridemia: Secondary | ICD-10-CM | POA: Diagnosis not present

## 2020-02-15 DIAGNOSIS — E8881 Metabolic syndrome: Secondary | ICD-10-CM | POA: Diagnosis not present

## 2020-02-15 DIAGNOSIS — E559 Vitamin D deficiency, unspecified: Secondary | ICD-10-CM | POA: Diagnosis not present

## 2020-02-15 DIAGNOSIS — G4733 Obstructive sleep apnea (adult) (pediatric): Secondary | ICD-10-CM | POA: Diagnosis not present

## 2020-02-15 DIAGNOSIS — F319 Bipolar disorder, unspecified: Secondary | ICD-10-CM | POA: Diagnosis not present

## 2020-02-18 DIAGNOSIS — G4733 Obstructive sleep apnea (adult) (pediatric): Secondary | ICD-10-CM | POA: Diagnosis not present

## 2020-02-18 DIAGNOSIS — M25371 Other instability, right ankle: Secondary | ICD-10-CM | POA: Diagnosis not present

## 2020-03-14 DIAGNOSIS — G4733 Obstructive sleep apnea (adult) (pediatric): Secondary | ICD-10-CM | POA: Diagnosis not present

## 2020-03-14 DIAGNOSIS — M25371 Other instability, right ankle: Secondary | ICD-10-CM | POA: Diagnosis not present

## 2020-03-16 DIAGNOSIS — Z01818 Encounter for other preprocedural examination: Secondary | ICD-10-CM | POA: Diagnosis not present

## 2020-03-17 ENCOUNTER — Other Ambulatory Visit: Payer: Medicaid Other

## 2020-03-17 DIAGNOSIS — Z20822 Contact with and (suspected) exposure to covid-19: Secondary | ICD-10-CM | POA: Diagnosis not present

## 2020-03-18 LAB — SARS-COV-2, NAA 2 DAY TAT

## 2020-03-18 LAB — NOVEL CORONAVIRUS, NAA: SARS-CoV-2, NAA: DETECTED — AB

## 2020-04-05 DIAGNOSIS — Z8616 Personal history of COVID-19: Secondary | ICD-10-CM | POA: Diagnosis not present

## 2020-04-05 DIAGNOSIS — Z888 Allergy status to other drugs, medicaments and biological substances status: Secondary | ICD-10-CM | POA: Diagnosis not present

## 2020-04-05 DIAGNOSIS — E781 Pure hyperglyceridemia: Secondary | ICD-10-CM | POA: Diagnosis not present

## 2020-04-05 DIAGNOSIS — Z5941 Food insecurity: Secondary | ICD-10-CM | POA: Diagnosis not present

## 2020-04-05 DIAGNOSIS — G4733 Obstructive sleep apnea (adult) (pediatric): Secondary | ICD-10-CM | POA: Diagnosis not present

## 2020-04-05 DIAGNOSIS — Z79899 Other long term (current) drug therapy: Secondary | ICD-10-CM | POA: Diagnosis not present

## 2020-04-05 DIAGNOSIS — Z6841 Body Mass Index (BMI) 40.0 and over, adult: Secondary | ICD-10-CM | POA: Diagnosis not present

## 2020-04-05 DIAGNOSIS — F319 Bipolar disorder, unspecified: Secondary | ICD-10-CM | POA: Diagnosis not present

## 2020-04-05 DIAGNOSIS — E559 Vitamin D deficiency, unspecified: Secondary | ICD-10-CM | POA: Diagnosis not present

## 2020-04-05 DIAGNOSIS — E8881 Metabolic syndrome: Secondary | ICD-10-CM | POA: Diagnosis not present

## 2020-04-07 ENCOUNTER — Telehealth: Payer: Self-pay

## 2020-04-07 NOTE — Telephone Encounter (Signed)
Transition Care Management Follow-up Telephone Call  Date of discharge and from where: 04/06/2020 from [place]  How have you been since you were released from the hospital? Pt states that she is feeling well.   Any questions or concerns? Yes Pt asked about the compression stockings and whether or not she could take them off. Pt informed that the surgeon's office could go over the recommendation for the compression stockings, this was after thoroughly reviewing the dc instructions from the surgeon.   Items Reviewed:  Did the pt receive and understand the discharge instructions provided? Yes   Medications obtained and verified? Yes   Other? No   Any new allergies since your discharge? No   Dietary orders reviewed? n/a  Do you have support at home? Yes    Functional Questionnaire: (I = Independent and D = Dependent) ADLs: I  Bathing/Dressing- I  Meal Prep- I  Eating- I  Maintaining continence- I  Transferring/Ambulation- I  Managing Meds- I   Follow up appointments reviewed:   PCP Hospital f/u appt confirmed? No    Specialist Hospital f/u appt confirmed? Yes  Scheduled to see [facility] on 04/12/2020 @ 09:30am.  Are transportation arrangements needed? No   If their condition worsens, is the pt aware to call PCP or go to the Emergency Dept.? Yes  Was the patient provided with contact information for the PCP's office or ED? Yes  Was to pt encouraged to call back with questions or concerns? Yes

## 2020-04-11 DIAGNOSIS — M25371 Other instability, right ankle: Secondary | ICD-10-CM | POA: Diagnosis not present

## 2020-04-11 DIAGNOSIS — G4733 Obstructive sleep apnea (adult) (pediatric): Secondary | ICD-10-CM | POA: Diagnosis not present

## 2020-04-20 DIAGNOSIS — Z6841 Body Mass Index (BMI) 40.0 and over, adult: Secondary | ICD-10-CM | POA: Diagnosis not present

## 2020-04-20 DIAGNOSIS — Z9884 Bariatric surgery status: Secondary | ICD-10-CM | POA: Diagnosis not present

## 2020-04-20 DIAGNOSIS — G4710 Hypersomnia, unspecified: Secondary | ICD-10-CM | POA: Diagnosis not present

## 2020-04-20 DIAGNOSIS — G4733 Obstructive sleep apnea (adult) (pediatric): Secondary | ICD-10-CM | POA: Diagnosis not present

## 2020-05-08 DIAGNOSIS — Z9889 Other specified postprocedural states: Secondary | ICD-10-CM | POA: Insufficient documentation

## 2020-05-12 DIAGNOSIS — M25371 Other instability, right ankle: Secondary | ICD-10-CM | POA: Diagnosis not present

## 2020-05-12 DIAGNOSIS — G4733 Obstructive sleep apnea (adult) (pediatric): Secondary | ICD-10-CM | POA: Diagnosis not present

## 2020-05-18 DIAGNOSIS — K912 Postsurgical malabsorption, not elsewhere classified: Secondary | ICD-10-CM | POA: Insufficient documentation

## 2020-05-18 DIAGNOSIS — Z9884 Bariatric surgery status: Secondary | ICD-10-CM | POA: Insufficient documentation

## 2020-05-25 ENCOUNTER — Telehealth: Payer: Self-pay

## 2020-05-25 NOTE — Telephone Encounter (Signed)
Would you be willing to take out and reinsert a new nexplonon ? Pt does not need appointment until after June.

## 2020-05-25 NOTE — Telephone Encounter (Signed)
Pt is not due for removal of current Nexplanon until 07/15/20. No pregnancy test required. Pt is scheduled in 30 min slot 07/05/20. Pts name put on Nexplanon in manager's office.

## 2020-05-25 NOTE — Telephone Encounter (Signed)
Yes that is fine, as long it is in the 3-year window and we do not have to do the 2 weeks urine pregnancy before, so just make sure she gets the appointment within the window of when her other 1 is still good.  If it is outside the window then she needs to come 2 weeks before get a urine pregnancy not have intercourse and then have the appointment for the Nexplanon insertion and removal.  Also make sure that we have one ordered with her name on it.

## 2020-06-09 DIAGNOSIS — Z713 Dietary counseling and surveillance: Secondary | ICD-10-CM | POA: Diagnosis not present

## 2020-06-11 DIAGNOSIS — M25371 Other instability, right ankle: Secondary | ICD-10-CM | POA: Diagnosis not present

## 2020-06-11 DIAGNOSIS — G4733 Obstructive sleep apnea (adult) (pediatric): Secondary | ICD-10-CM | POA: Diagnosis not present

## 2020-07-05 ENCOUNTER — Encounter: Payer: Self-pay | Admitting: Family Medicine

## 2020-07-05 ENCOUNTER — Ambulatory Visit: Payer: Medicaid Other | Admitting: Family Medicine

## 2020-07-05 ENCOUNTER — Other Ambulatory Visit: Payer: Self-pay

## 2020-07-05 VITALS — BP 128/75 | HR 90 | Temp 98.1°F | Resp 20 | Ht 64.0 in | Wt 235.0 lb

## 2020-07-05 DIAGNOSIS — Z309 Encounter for contraceptive management, unspecified: Secondary | ICD-10-CM | POA: Diagnosis not present

## 2020-07-05 DIAGNOSIS — Z3046 Encounter for surveillance of implantable subdermal contraceptive: Secondary | ICD-10-CM | POA: Diagnosis not present

## 2020-07-05 LAB — PREGNANCY, URINE: Preg Test, Ur: NEGATIVE

## 2020-07-05 MED ORDER — ETONOGESTREL 68 MG ~~LOC~~ IMPL
68.0000 mg | DRUG_IMPLANT | Freq: Once | SUBCUTANEOUS | Status: AC
  Administered 2020-07-05: 68 mg via SUBCUTANEOUS

## 2020-07-05 NOTE — Progress Notes (Signed)
BP 128/75   Pulse 90   Temp 98.1 F (36.7 C)   Resp 20   Ht 5\' 4"  (1.626 m)   Wt 235 lb (106.6 kg)   SpO2 100%   BMI 40.34 kg/m    Subjective:   Patient ID: Kelly Fry, female    DOB: 08-29-1985, 35 y.o.   MRN: [id number]  HPI: Kelly Fry is a 35 y.o. female presenting on 07/05/2020 for nexplanon removal and insertion    HPI Nexplanon removal and reinsertion Patient is coming in for Nexplanon removal and reinsertion.  She has been happy with her Nexplanon and Has not had any issues with it would like a new 1.  Relevant past medical, surgical, family and social history reviewed and updated as indicated. Interim medical history since our last visit reviewed. Allergies and medications reviewed and updated.  Review of Systems  Constitutional: Negative for activity change, appetite change, fatigue and unexpected weight change.  Genitourinary: Negative for flank pain and menstrual problem.    Per HPI unless specifically indicated above   Allergies as of 07/05/2020      Reactions   Latuda [lurasidone] Other (See Comments)   Shaky; worsened mood      Medication List       Accurate as of Jul 05, 2020 10:08 AM. If you have any questions, ask your nurse or doctor.        albuterol 108 (90 Base) MCG/ACT inhaler Commonly known as: VENTOLIN HFA Inhale 2 puffs into the lungs every 6 (six) hours as needed for wheezing or shortness of breath.   dexamethasone 6 MG tablet Commonly known as: DECADRON Take 1 tablet (6 mg total) by mouth daily.   escitalopram 20 MG tablet Commonly known as: Lexapro Take 1 tablet (20 mg total) by mouth daily.   fluticasone 50 MCG/ACT nasal spray Commonly known as: FLONASE Place 2 sprays into both nostrils daily.   Vitamin D (Ergocalciferol) 1.25 MG (50000 UNIT) Caps capsule Commonly known as: DRISDOL Take 1 capsule (50,000 Units total) by mouth every 7 (seven) days.        Objective:   There were no vitals taken for this  visit.  Wt Readings from Last 3 Encounters:  11/16/19 295 lb (133.8 kg)  10/25/19 292 lb (132.5 kg)  09/28/19 290 lb (131.5 kg)    Physical Exam Vitals and nursing note reviewed.  Constitutional:      Appearance: Normal appearance.  Neurological:     Mental Status: She is alert.    Nexplanon removal: Nexplanon is palpable on her left upper inner arm. Site was prepped with Betadine. 65mL of 2% lidocaine without epinephrine were used for anesthesia. 15 blade was used to dissect down to 1 and of the device. Forceps were used to grab the end of the device and extracted which came out intact and whole.   Nexplanon insertion: Patient educated on Nexplanon birth control and its side effects and the side effects from the procedure. Patient wishes to continue. Device was prepped using sterile procedure. Nexplanon was inserted using package [employer] Company directions into the same opening that the prior one was removed. Steri-Strips were applied and pressure dressing placed. Bleeding was minimal and patient tolerated procedure well.    Assessment & Plan:   Problem List Items Addressed This Visit   None   Visit Diagnoses    Encounter for removal and reinsertion of Nexplanon    -  Primary   Encounter for contraceptive management, unspecified  type       Relevant Orders   Pregnancy, urine       Follow up plan: No follow-ups on file.  Counseling provided for all of the vaccine components Orders Placed This Encounter  Procedures  . Pregnancy, urine    Caryl Pina, MD [facility] 07/05/2020, 10:08 AM

## 2020-07-06 ENCOUNTER — Ambulatory Visit: Payer: Medicaid Other | Admitting: Family Medicine

## 2020-08-01 DIAGNOSIS — Z713 Dietary counseling and surveillance: Secondary | ICD-10-CM | POA: Diagnosis not present

## 2020-08-01 DIAGNOSIS — E669 Obesity, unspecified: Secondary | ICD-10-CM | POA: Diagnosis not present

## 2020-10-04 DIAGNOSIS — Z9884 Bariatric surgery status: Secondary | ICD-10-CM | POA: Diagnosis not present

## 2020-10-04 DIAGNOSIS — K912 Postsurgical malabsorption, not elsewhere classified: Secondary | ICD-10-CM | POA: Diagnosis not present

## 2020-11-01 DIAGNOSIS — Z6836 Body mass index (BMI) 36.0-36.9, adult: Secondary | ICD-10-CM | POA: Diagnosis not present

## 2020-11-01 DIAGNOSIS — Z713 Dietary counseling and surveillance: Secondary | ICD-10-CM | POA: Diagnosis not present

## 2020-11-01 DIAGNOSIS — Z9884 Bariatric surgery status: Secondary | ICD-10-CM | POA: Diagnosis not present

## 2020-11-15 DIAGNOSIS — Z9884 Bariatric surgery status: Secondary | ICD-10-CM | POA: Diagnosis not present

## 2020-11-15 DIAGNOSIS — Z903 Acquired absence of stomach [part of]: Secondary | ICD-10-CM | POA: Diagnosis not present

## 2020-11-15 DIAGNOSIS — E8881 Metabolic syndrome: Secondary | ICD-10-CM | POA: Diagnosis not present

## 2020-11-15 DIAGNOSIS — G4733 Obstructive sleep apnea (adult) (pediatric): Secondary | ICD-10-CM | POA: Diagnosis not present

## 2020-11-15 DIAGNOSIS — K912 Postsurgical malabsorption, not elsewhere classified: Secondary | ICD-10-CM | POA: Diagnosis not present

## 2020-11-15 DIAGNOSIS — E559 Vitamin D deficiency, unspecified: Secondary | ICD-10-CM | POA: Diagnosis not present

## 2020-11-15 DIAGNOSIS — F319 Bipolar disorder, unspecified: Secondary | ICD-10-CM | POA: Diagnosis not present

## 2020-11-15 DIAGNOSIS — E781 Pure hyperglyceridemia: Secondary | ICD-10-CM | POA: Diagnosis not present

## 2021-01-24 DIAGNOSIS — Z713 Dietary counseling and surveillance: Secondary | ICD-10-CM | POA: Diagnosis not present

## 2021-01-24 DIAGNOSIS — E669 Obesity, unspecified: Secondary | ICD-10-CM | POA: Diagnosis not present

## 2021-01-24 DIAGNOSIS — Z9884 Bariatric surgery status: Secondary | ICD-10-CM | POA: Diagnosis not present

## 2021-02-15 IMAGING — DX LEFT KNEE - 1-2 VIEW
3 series · 3 of 3 positions shown · non-contrast
Comparison: None.

CLINICAL DATA: 33-year-old female with a history of fall and knee
pain

EXAM:
LEFT KNEE - 1-2 VIEW

[knee ap (1 of 2)]
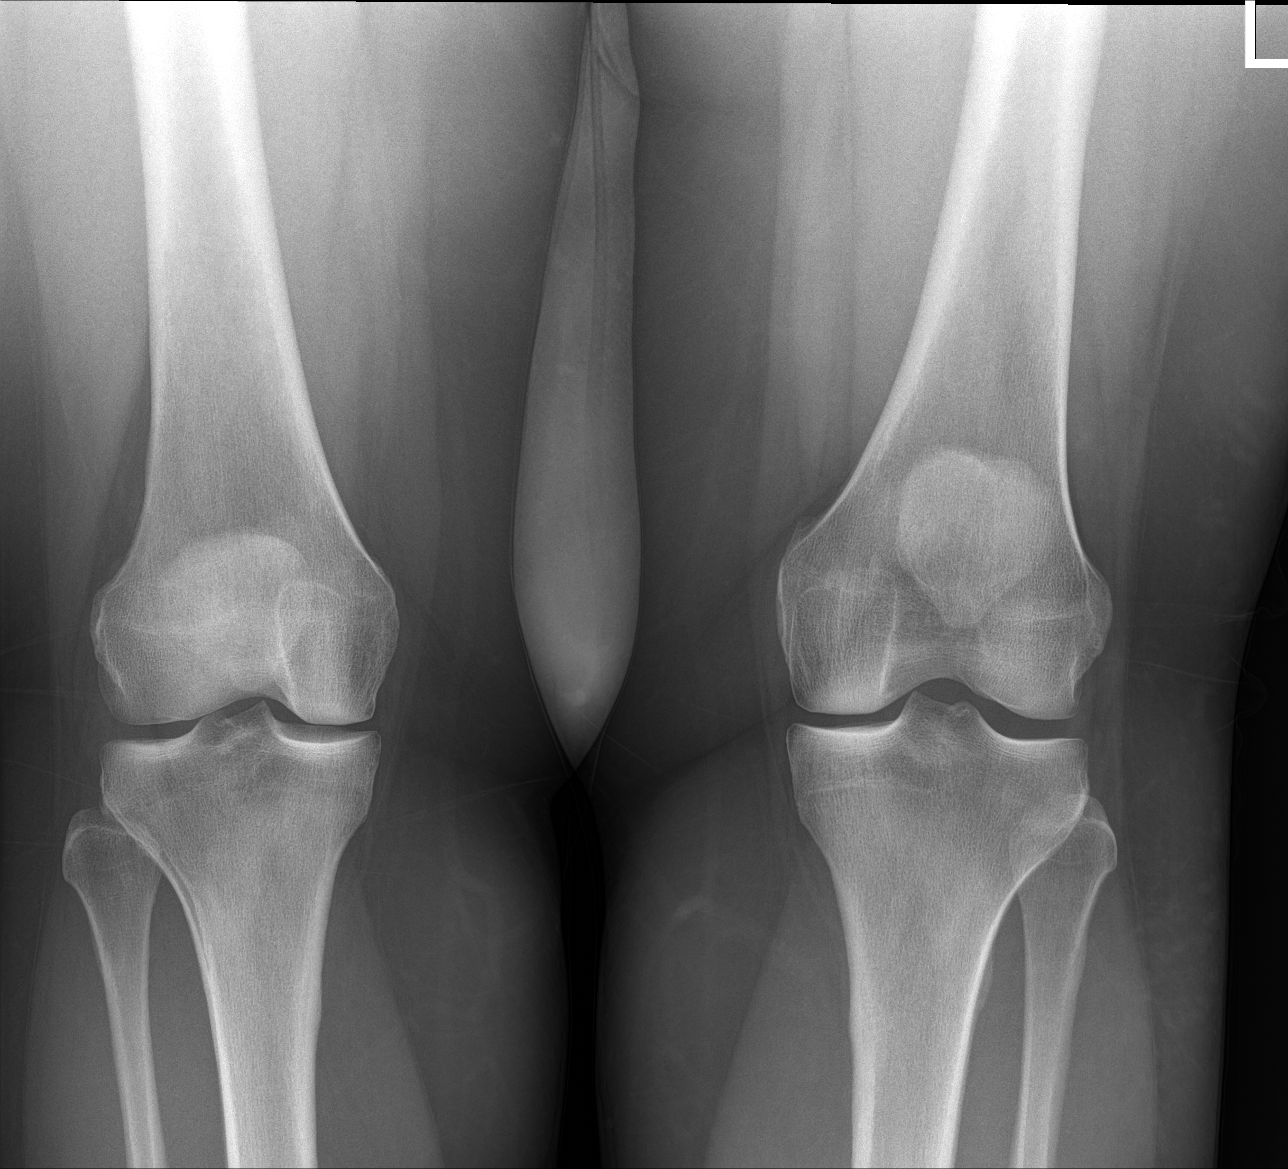

[knee lat]
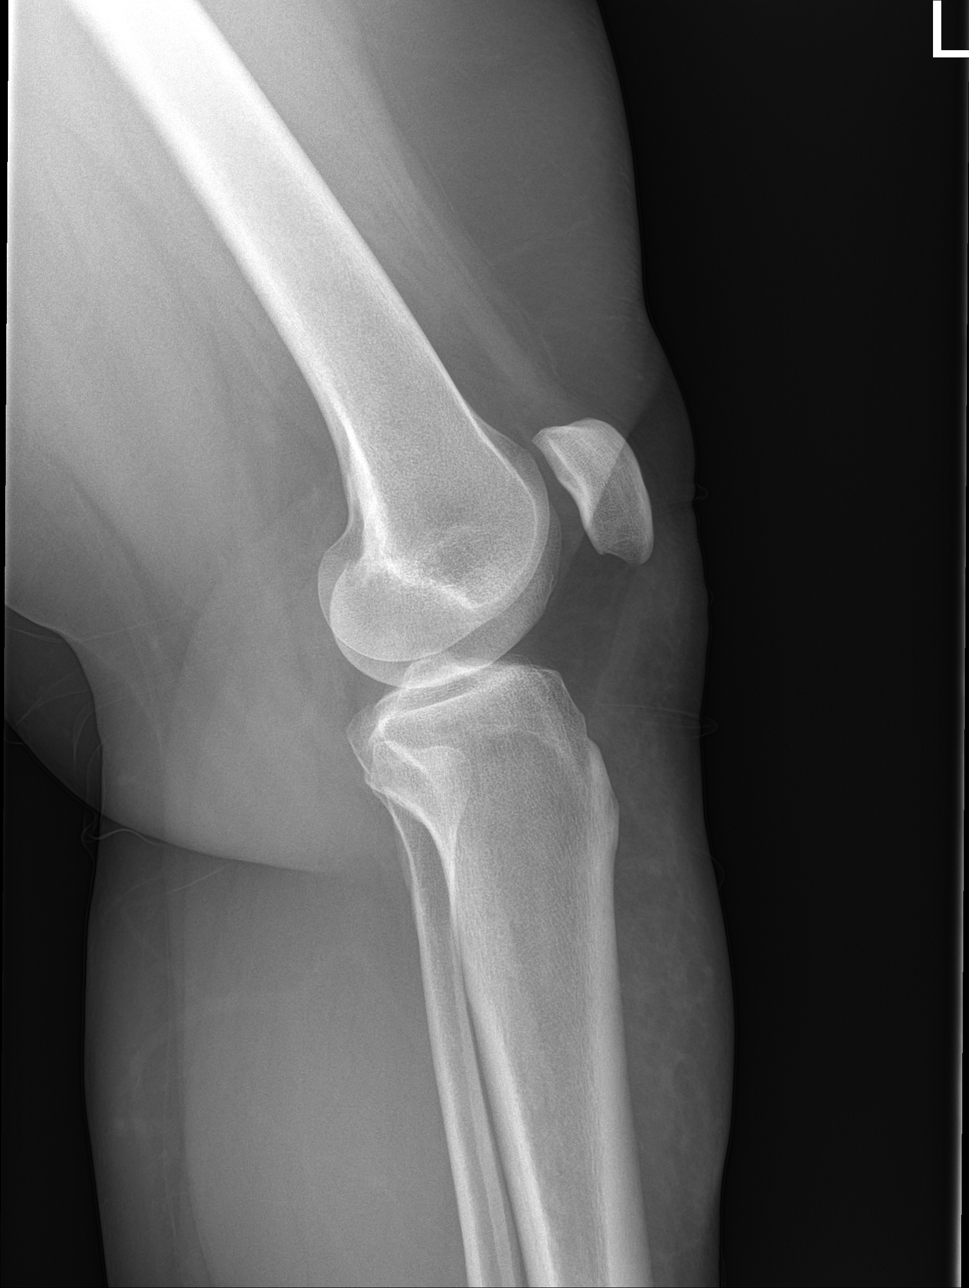

[knee ap (2 of 2)]
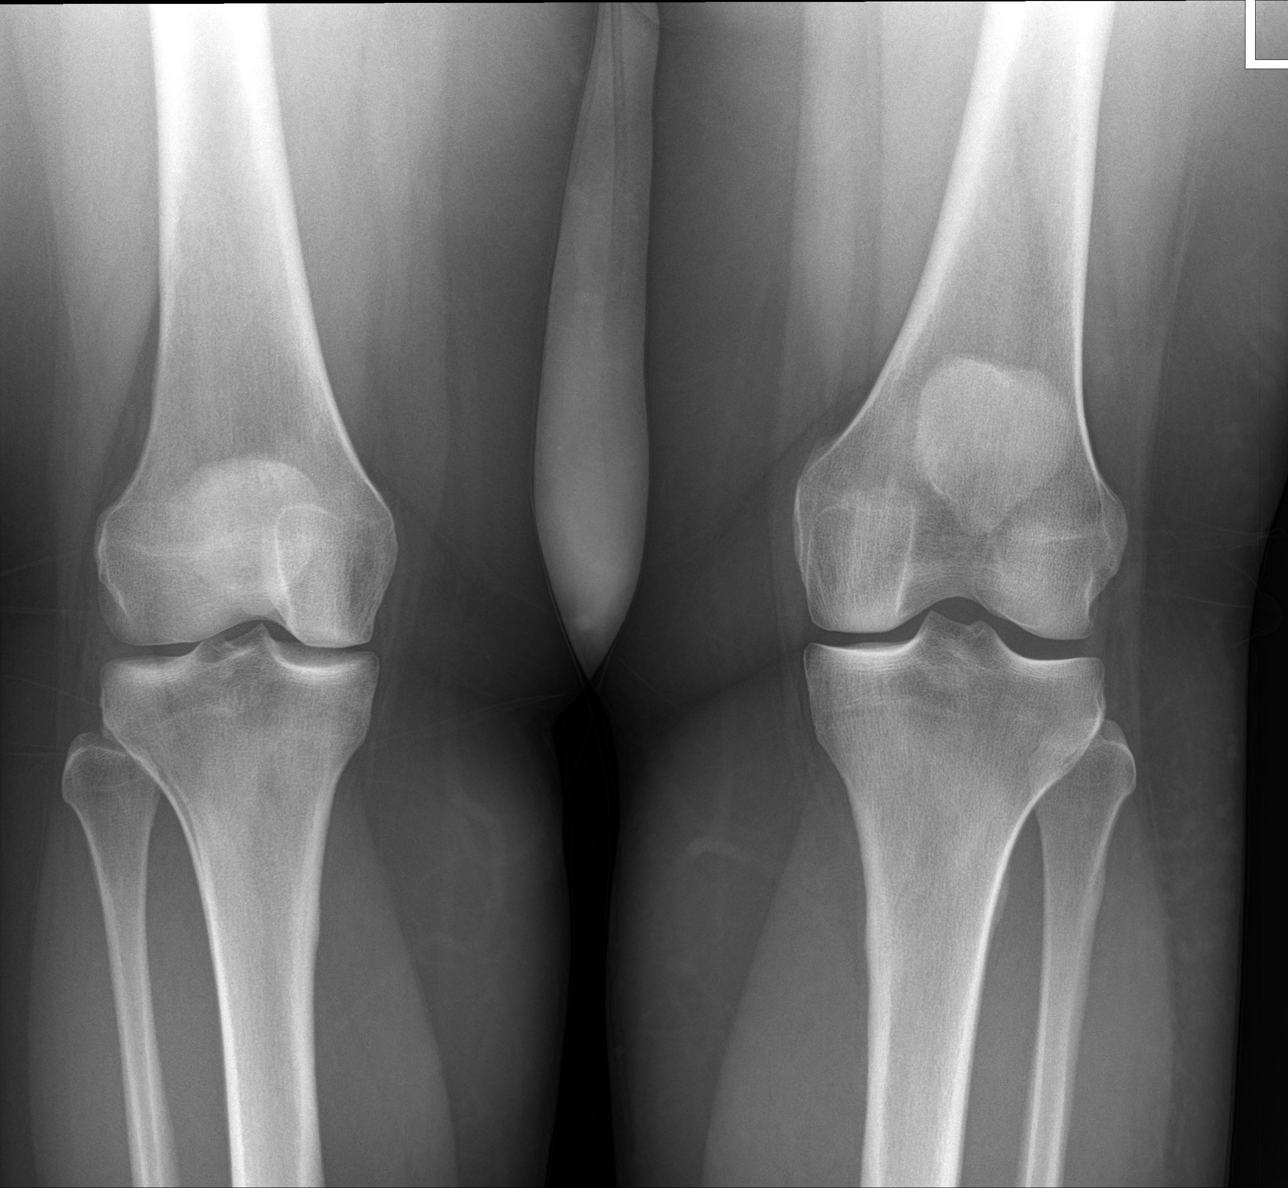

[3 of 3 positions shown; findings below may reference images not displayed]

FINDINGS: No evidence of fracture, dislocation, or joint effusion. No evidence
of arthropathy or other focal bone abnormality. Soft tissues are
unremarkable.
IMPRESSION: Negative.

## 2021-04-18 DIAGNOSIS — Z713 Dietary counseling and surveillance: Secondary | ICD-10-CM | POA: Diagnosis not present

## 2021-04-18 DIAGNOSIS — E669 Obesity, unspecified: Secondary | ICD-10-CM | POA: Diagnosis not present

## 2021-04-18 DIAGNOSIS — Z9884 Bariatric surgery status: Secondary | ICD-10-CM | POA: Diagnosis not present

## 2021-04-25 DIAGNOSIS — K912 Postsurgical malabsorption, not elsewhere classified: Secondary | ICD-10-CM | POA: Diagnosis not present

## 2021-04-25 DIAGNOSIS — Z9884 Bariatric surgery status: Secondary | ICD-10-CM | POA: Diagnosis not present

## 2021-04-25 DIAGNOSIS — Z903 Acquired absence of stomach [part of]: Secondary | ICD-10-CM | POA: Diagnosis not present

## 2021-06-04 ENCOUNTER — Ambulatory Visit (INDEPENDENT_AMBULATORY_CARE_PROVIDER_SITE_OTHER): Payer: Medicaid Other | Admitting: Family Medicine

## 2021-06-04 ENCOUNTER — Encounter: Payer: Self-pay | Admitting: Family Medicine

## 2021-06-04 DIAGNOSIS — J301 Allergic rhinitis due to pollen: Secondary | ICD-10-CM | POA: Diagnosis not present

## 2021-06-04 MED ORDER — FLUTICASONE PROPIONATE 50 MCG/ACT NA SUSP: 1.0000 | Freq: Two times a day (BID) | NASAL | 1 refills | Status: DC | PRN

## 2021-06-04 MED ORDER — LORATADINE 10 MG PO TABS: 10.0000 mg | ORAL_TABLET | Freq: Every day | ORAL | 1 refills | Status: DC

## 2021-06-04 NOTE — Progress Notes (Signed)
? ?Virtual Visit via telephone Note ? ?I connected with Kelly Fry on 06/04/21 at 1635 by telephone and verified that I am speaking with the correct person using two identifiers. Kelly Fry is currently located at car and patient are currently with her during visit. The provider, [name], MD is located in their office at time of visit. ? ?Call ended at 1641 ? ?I discussed the limitations, risks, security and privacy concerns of performing an evaluation and management service by telephone and the availability of in person appointments. I also discussed with the patient that there may be a patient responsible charge related to this service. The patient expressed understanding and agreed to proceed. ? ? ?History and Present Illness: ?Patient is calling in for sinus congestion and pressure and in her ears.  The ears are just the last 2 days. The pressure comes and goes and gets dizzy sometimes.  She is not on allergy medicines currently. She does have a cough that is dry and scratchy.   ? ?1. Seasonal allergic rhinitis due to pollen   ? ? ?Outpatient Encounter Medications as of 06/04/2021  ?Medication Sig  ? fluticasone (FLONASE) 50 MCG/ACT nasal spray Place 1 spray into both nostrils 2 (two) times daily as needed for allergies or rhinitis.  ? loratadine (CLARITIN) 10 MG tablet Take 1 tablet (10 mg total) by mouth daily.  ? albuterol (PROVENTIL HFA;VENTOLIN HFA) 108 (90 Base) MCG/ACT inhaler Inhale 2 puffs into the lungs every 6 (six) hours as needed for wheezing or shortness of breath.  ? calcium-vitamin D (OSCAL WITH D) 500-200 MG-UNIT TABS tablet Take 1 tablet by mouth daily.  ? Multiple Vitamin (MULTIVITAMIN) tablet Take 1 tablet by mouth 2 (two) times daily.  ? omeprazole (PRILOSEC) 40 MG capsule Take 1 capsule by mouth daily.  ? ursodiol (ACTIGALL) 250 MG tablet Take 1 tablet by mouth in the morning and at bedtime.  ? ?No facility-administered encounter medications on file as of 06/04/2021.   ? ? ?Review of Systems  ?Constitutional:  Negative for chills and fever.  ?HENT:  Positive for congestion, postnasal drip, rhinorrhea, sinus pressure and sneezing. Negative for ear discharge, ear pain and sore throat.   ?Eyes:  Negative for visual disturbance.  ?Respiratory:  Positive for cough. Negative for chest tightness and shortness of breath.   ?Cardiovascular:  Negative for chest pain and leg swelling.  ?Genitourinary:  Negative for difficulty urinating and dysuria.  ?Musculoskeletal:  Negative for back pain and gait problem.  ?Skin:  Negative for rash.  ?Neurological:  Negative for light-headedness and headaches.  ?Psychiatric/Behavioral:  Negative for agitation and behavioral problems.   ?All other systems reviewed and are negative. ? ?Observations/Objective: ?Patient sounds comfortable and in no acute distress ? ?Assessment and Plan: ?Problem List Items Addressed This Visit   ?None ?Visit Diagnoses   ? ? Seasonal allergic rhinitis due to pollen    -  Primary  ? Relevant Medications  ? loratadine (CLARITIN) 10 MG tablet  ? fluticasone (FLONASE) 50 MCG/ACT nasal spray  ? ?  ?She gets seasonal allergies every time this time a year but did not know what she could take because of her bariatric surgery and so she wanted to call just to make sure what was safe and what was not safe for her. ? ?Follow up plan: ?Return if symptoms worsen or fail to improve. ? ? ?  ?I discussed the assessment and treatment plan with the patient. The patient was provided an  opportunity to ask questions and all were answered. The patient agreed with the plan and demonstrated an understanding of the instructions. ?  ?The patient was advised to call back or seek an in-person evaluation if the symptoms worsen or if the condition fails to improve as anticipated. ? ?The above assessment and management plan was discussed with the patient. The patient verbalized understanding of and has agreed to the management plan. Patient is aware to  call the clinic if symptoms persist or worsen. Patient is aware when to return to the clinic for a follow-up visit. Patient educated on when it is appropriate to go to the emergency department.  ? ? ?I provided 6 minutes of non-face-to-face time during this encounter. ? ? ? ?Worthy Rancher, MD ?  ? ?

## 2021-06-27 ENCOUNTER — Encounter: Payer: Self-pay | Admitting: Nurse Practitioner

## 2021-06-27 ENCOUNTER — Ambulatory Visit (INDEPENDENT_AMBULATORY_CARE_PROVIDER_SITE_OTHER): Payer: Medicaid Other | Admitting: Nurse Practitioner

## 2021-06-27 DIAGNOSIS — Z91199 Patient's noncompliance with other medical treatment and regimen due to unspecified reason: Secondary | ICD-10-CM

## 2021-06-27 NOTE — Progress Notes (Signed)
Patient did not answer phone call after 2 attempts, at time of appointment and after hours.  ?

## 2021-07-05 DIAGNOSIS — F319 Bipolar disorder, unspecified: Secondary | ICD-10-CM | POA: Diagnosis not present

## 2021-07-05 DIAGNOSIS — G4733 Obstructive sleep apnea (adult) (pediatric): Secondary | ICD-10-CM | POA: Diagnosis not present

## 2021-07-05 DIAGNOSIS — Z9884 Bariatric surgery status: Secondary | ICD-10-CM | POA: Diagnosis not present

## 2021-07-05 DIAGNOSIS — E559 Vitamin D deficiency, unspecified: Secondary | ICD-10-CM | POA: Diagnosis not present

## 2021-07-05 DIAGNOSIS — Z903 Acquired absence of stomach [part of]: Secondary | ICD-10-CM | POA: Diagnosis not present

## 2021-07-05 DIAGNOSIS — K912 Postsurgical malabsorption, not elsewhere classified: Secondary | ICD-10-CM | POA: Diagnosis not present

## 2021-07-05 DIAGNOSIS — E8881 Metabolic syndrome: Secondary | ICD-10-CM | POA: Diagnosis not present

## 2021-07-05 DIAGNOSIS — E781 Pure hyperglyceridemia: Secondary | ICD-10-CM | POA: Diagnosis not present

## 2021-07-24 ENCOUNTER — Encounter: Payer: Self-pay | Admitting: Family

## 2021-07-24 ENCOUNTER — Ambulatory Visit (INDEPENDENT_AMBULATORY_CARE_PROVIDER_SITE_OTHER): Payer: Medicaid Other | Admitting: Family

## 2021-07-24 VITALS — BP 117/78 | HR 67 | Temp 98.5°F | Ht 64.0 in | Wt 181.6 lb

## 2021-07-24 DIAGNOSIS — B349 Viral infection, unspecified: Secondary | ICD-10-CM | POA: Diagnosis not present

## 2021-07-24 DIAGNOSIS — R110 Nausea: Secondary | ICD-10-CM | POA: Diagnosis not present

## 2021-07-24 MED ORDER — ONDANSETRON HCL 4 MG PO TABS: 4.0000 mg | ORAL_TABLET | Freq: Three times a day (TID) | ORAL | 0 refills | Status: DC | PRN

## 2021-07-24 NOTE — Patient Instructions (Signed)
Nausea, Adult Nausea is the feeling of having an upset stomach or that you are about to vomit. Nausea on its own is not usually a serious concern, but it may be an early sign of a more serious medical problem. As nausea gets worse, it can lead to vomiting. If vomiting develops, or if you are not able to drink enough fluids, you are at risk of becoming dehydrated. Dehydration can make you tired and thirsty, cause you to have a dry mouth, and decrease how often you urinate. Older adults and people with other diseases or a weak disease-fighting system (immune system) are at higher risk for dehydration. The main goals of treating your nausea are: To relieve your nausea. To limit repeated nausea episodes. To prevent vomiting and dehydration. Follow these instructions at home: Watch your symptoms for any changes. Tell your health care provider about them. Eating and drinking     Take an oral rehydration solution (ORS). This is a drink that is sold at pharmacies and retail stores. Drink clear fluids slowly and in small amounts as you are able. Clear fluids include water, ice chips, low-calorie sports drinks, and fruit juice that has water added (diluted fruit juice). Eat bland, easy-to-digest foods in small amounts as you are able. These foods include bananas, applesauce, rice, lean meats, toast, and crackers. Avoid drinking fluids that contain a lot of sugar or caffeine, such as energy drinks, sports drinks, and soda. Avoid alcohol. Avoid spicy or fatty foods. General instructions Take over-the-counter and prescription medicines only as told by your health care provider. Rest at home while you recover. Drink enough fluid to keep your urine pale yellow. Breathe slowly and deeply when you feel nauseous. Avoid smelling things that have strong odors. Wash your hands often using soap and water for at least 20 seconds. If soap and water are not available, use hand sanitizer. Make sure that everyone in  your household washes their hands well and often. Keep all follow-up visits. This is important. Contact a health care provider if: Your nausea gets worse. Your nausea does not go away after two days. You vomit multiple times. You cannot drink fluids without vomiting. You have any of the following: New symptoms. A fever. A headache. Muscle cramps. A rash. Pain while urinating. You feel light-headed or dizzy. Get help right away if: You have pain in your chest, neck, arm, or jaw. You feel extremely weak or you faint. You have vomit that is bright red or looks like coffee grounds. You have bloody or black stools (feces) or stools that look like tar. You have a severe headache, a stiff neck, or both. You have severe pain, cramping, or bloating in your abdomen. You have difficulty breathing or are breathing very quickly. Your heart is beating very quickly. Your skin feels cold and clammy. You feel confused. You have signs of dehydration, such as: Dark urine, very little urine, or no urine. Cracked lips. Dry mouth. Sunken eyes. Sleepiness. Weakness. These symptoms may be an emergency. Get help right away. Call 911. Do not wait to see if the symptoms will go away. Do not drive yourself to the hospital. Summary Nausea is the feeling that you have an upset stomach or that you are about to vomit. Nausea on its own is not usually a serious concern, but it may be an early sign of a more serious medical problem. If vomiting develops, or if you are not able to drink enough fluids, you are at risk of becoming   dehydrated. Follow recommendations for eating and drinking and take over-the-counter and prescription medicines only as told by your health care provider. Contact a health care provider right away if your symptoms worsen or you have new symptoms. Keep all follow-up visits. This is important. This information is not intended to replace advice given to you by your health care provider.  Make sure you discuss any questions you have with your health care provider. Document Revised: 08/04/2020 Document Reviewed: 08/04/2020 Elsevier Patient Education  2023 [employer]  

## 2021-07-24 NOTE — Progress Notes (Signed)
Subjective:    Patient ID: Kelly Fry, female    DOB: 03-May-1985, 36 y.o.   MRN: [id number]  Chief Complaint  Patient presents with   Nausea    Since she rode in a back seat of a car yesterday.     HPI Pt presents to the office today with nausea that started yesterday after riding in the backseat of a car. She gets motions sickness, but it usually stops after she gets out of the car. She reports the nausea has been constant. Denies any fever, vomiting, diarrhea, URI symptoms or abdominal pain.   Last menstrual cycle last week.   She has not taken anything for it.   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   Review of Systems  All other systems reviewed and are negative.      Objective:   Physical Exam Vitals reviewed.  Constitutional:      General: She is not in acute distress.    Appearance: She is well-developed.  HENT:     Head: Normocephalic and atraumatic.     Right Ear: Tympanic membrane normal.     Left Ear: Tympanic membrane normal.  Eyes:     Pupils: Pupils are equal, round, and reactive to light.  Neck:     Thyroid: No thyromegaly.  Cardiovascular:     Rate and Rhythm: Normal rate and regular rhythm.     Heart sounds: Normal heart sounds. No murmur heard. Pulmonary:     Effort: Pulmonary effort is normal. No respiratory distress.     Breath sounds: Normal breath sounds. No wheezing.  Abdominal:     General: Bowel sounds are normal. There is no distension.     Palpations: Abdomen is soft.     Tenderness: There is no abdominal tenderness.  Musculoskeletal:        General: No tenderness. Normal range of motion.     Cervical back: Normal range of motion and neck supple.  Skin:    General: Skin is warm and dry.  Neurological:     Mental Status: She is alert and oriented to person, place, and time.     Cranial Nerves: No cranial nerve deficit.     Deep Tendon Reflexes: Reflexes are normal and symmetric.  Psychiatric:        Behavior: Behavior normal.         Thought Content: Thought content normal.        Judgment: Judgment normal.    BP 117/78   Pulse 67   Temp 98.5 F (36.9 C) (Temporal)   Ht '5\' 4"'$  (1.626 m)   Wt 181 lb 9.6 oz (82.4 kg)   SpO2 100%   BMI 31.17 kg/m      Assessment & Plan:  Kelly Fry comes in today with chief complaint of Nausea (Since she rode in a back seat of a car yesterday. )   Diagnosis and orders addressed:  1. Nausea - ondansetron (ZOFRAN) 4 MG tablet; Take 1 tablet (4 mg total) by mouth every 8 (eight) hours as needed for nausea or vomiting.  Dispense: 20 tablet; Refill: 0  2. Viral illness - ondansetron (ZOFRAN) 4 MG tablet; Take 1 tablet (4 mg total) by mouth every 8 (eight) hours as needed for nausea or vomiting.  Dispense: 20 tablet; Refill: 0   Zofran as needed  No acute finding on exam today, more than likely viral  If symptoms worsen or do not improve, may need lab work Work note given  Evelina Dun, FNP

## 2021-09-19 ENCOUNTER — Encounter (INDEPENDENT_AMBULATORY_CARE_PROVIDER_SITE_OTHER): Payer: Self-pay

## 2021-11-06 ENCOUNTER — Encounter: Payer: Self-pay | Admitting: Family Medicine

## 2021-11-06 ENCOUNTER — Telehealth: Payer: Self-pay

## 2021-11-06 ENCOUNTER — Ambulatory Visit: Payer: Medicaid Other | Admitting: Family Medicine

## 2021-11-06 ENCOUNTER — Other Ambulatory Visit: Payer: Self-pay | Admitting: Family Medicine

## 2021-11-06 VITALS — BP 110/75 | HR 67 | Temp 98.8°F | Ht 64.0 in | Wt 175.0 lb

## 2021-11-06 DIAGNOSIS — B372 Candidiasis of skin and nail: Secondary | ICD-10-CM | POA: Diagnosis not present

## 2021-11-06 DIAGNOSIS — I83813 Varicose veins of bilateral lower extremities with pain: Secondary | ICD-10-CM | POA: Diagnosis not present

## 2021-11-06 DIAGNOSIS — J4520 Mild intermittent asthma, uncomplicated: Secondary | ICD-10-CM

## 2021-11-06 MED ORDER — NYSTATIN-TRIAMCINOLONE 100000-0.1 UNIT/GM-% EX OINT: 1.0000 | TOPICAL_OINTMENT | Freq: Two times a day (BID) | CUTANEOUS | 0 refills | Status: DC

## 2021-11-06 MED ORDER — CLOTRIMAZOLE-BETAMETHASONE 1-0.05 % EX CREA: 1.0000 | TOPICAL_CREAM | Freq: Every day | CUTANEOUS | 0 refills | Status: DC

## 2021-11-06 MED ORDER — ALBUTEROL SULFATE HFA 108 (90 BASE) MCG/ACT IN AERS: 2.0000 | INHALATION_SPRAY | Freq: Four times a day (QID) | RESPIRATORY_TRACT | 2 refills | Status: AC | PRN

## 2021-11-06 NOTE — Progress Notes (Signed)
Subjective:  Patient ID: Kelly Fry, female    DOB: [birth year], 36 y.o.   MRN: [id number]  Patient Care Team: Sharion Balloon, FNP as PCP - General (Family Medicine)   Chief Complaint:  rash on stomach, veins bilateral legs with pain   HPI: Kelly Fry is a 36 y.o. female presenting on 11/06/2021 for rash on stomach, veins bilateral legs with pain   Pt presents today for evaluation of recurrent rash to abdominal skin folds. She has lost significant weight and now has sagging skin to abdomen. States rash has been intermittent over the last several months. Pruritic and burning in nature. She has tried several over the counter creams without resolution of symptoms.  She also reports bilateral leg pain with prominent varicose veins. States she has several varicose veins starting at thigh level and going down to ankles. Very engorged at times. She states she has stood on her feet on hard surfaces for over 10 years. States she has heaviness and fatigue in her legs that worsen as the day goes on. Has not tried compression stockings. Denies rupture of veins in the past.  She reports she needs a refill on her albuterol. Asthma is well controlled on current regimen.     Relevant past medical, surgical, family, and social history reviewed and updated as indicated.  Allergies and medications reviewed and updated. Data reviewed: Chart in Epic.   Past Medical History:  Diagnosis Date   Ankle instability, right    Anxiety    Back pain    Bipolar disorder (HCC)    Complication of anesthesia    Depression    Dyspnea    Family history of adverse reaction to anesthesia    Dad had n/v post anesthesia   GERD (gastroesophageal reflux disease)    Leg edema    Medical history non-contributory    Miscarriage    PONV (postoperative nausea and vomiting)    Vitamin D deficiency     Past Surgical History:  Procedure Laterality Date   ANKLE RECONSTRUCTION Right 02/21/2017   Procedure:  RIGHT LATERAL ANKLE RECONSTRUCTION WITH SPLIT PERONEAL LONGUS TENDON;  Surgeon: Marybelle Killings, MD;  Location: [facility];  Service: Orthopedics;  Laterality: Right;   NO PAST SURGERIES     WISDOM TOOTH EXTRACTION  04/12/2015    Social History   Socioeconomic History   Marital status: Married    Spouse name: Kimber Relic   Number of children: 2   Years of education: Not on file   Highest education level: Not on file  Occupational History   Occupation: Stay at Home  Tobacco Use   Smoking status: Never   Smokeless tobacco: Never  Vaping Use   Vaping Use: Never used  Substance and Sexual Activity   Alcohol use: No   Drug use: No   Sexual activity: Yes    Birth control/protection: I.U.D.  Other Topics Concern   Not on file  Social History Narrative   Not on file   Social Determinants of Health   Financial Resource Strain: Not on file  Food Insecurity: Not on file  Transportation Needs: Not on file  Physical Activity: Not on file  Stress: Not on file  Social Connections: Not on file  Intimate Partner Violence: Not on file    Outpatient Encounter Medications as of 11/06/2021  Medication Sig   nystatin-triamcinolone ointment (MYCOLOG) Apply 1 Application topically 2 (two) times daily.   albuterol (VENTOLIN HFA) 108 (90 Base)  MCG/ACT inhaler Inhale 2 puffs into the lungs every 6 (six) hours as needed for wheezing or shortness of breath.   calcium-vitamin D (OSCAL WITH D) 500-200 MG-UNIT TABS tablet Take 1 tablet by mouth daily.   fluticasone (FLONASE) 50 MCG/ACT nasal spray Place 1 spray into both nostrils 2 (two) times daily as needed for allergies or rhinitis.   loratadine (CLARITIN) 10 MG tablet Take 1 tablet (10 mg total) by mouth daily.   Multiple Vitamin (MULTIVITAMIN) tablet Take 1 tablet by mouth 2 (two) times daily.   omeprazole (PRILOSEC) 40 MG capsule Take 1 capsule by mouth daily.   [DISCONTINUED] albuterol (PROVENTIL HFA;VENTOLIN HFA) 108 (90 Base) MCG/ACT inhaler  Inhale 2 puffs into the lungs every 6 (six) hours as needed for wheezing or shortness of breath.   [DISCONTINUED] ondansetron (ZOFRAN) 4 MG tablet Take 1 tablet (4 mg total) by mouth every 8 (eight) hours as needed for nausea or vomiting.   [DISCONTINUED] ursodiol (ACTIGALL) 250 MG tablet Take 1 tablet by mouth in the morning and at bedtime. (Patient not taking: Reported on 07/24/2021)   No facility-administered encounter medications on file as of 11/06/2021.    Allergies  Allergen Reactions   Latuda [Lurasidone] Other (See Comments)    Shaky; worsened mood    Review of Systems  Constitutional:  Negative for activity change, appetite change, chills, diaphoresis, fatigue, fever and unexpected weight change.  Eyes:  Negative for photophobia and visual disturbance.  Respiratory:  Negative for cough and shortness of breath.   Cardiovascular:  Positive for leg swelling. Negative for chest pain and palpitations.  Gastrointestinal:  Negative for abdominal pain, nausea and vomiting.  Genitourinary:  Negative for decreased urine volume and difficulty urinating.  Musculoskeletal:  Positive for myalgias.  Skin:  Positive for color change and rash. Negative for pallor and wound.  Neurological:  Negative for dizziness, tremors, seizures, syncope, facial asymmetry, speech difficulty, weakness, light-headedness, numbness and headaches.  Psychiatric/Behavioral:  Negative for confusion.   All other systems reviewed and are negative.       Objective:  BP 110/75   Pulse 67   Temp 98.8 F (37.1 C)   Ht '5\' 4"'$  (1.626 m)   Wt 175 lb (79.4 kg)   SpO2 100%   BMI 30.04 kg/m    Wt Readings from Last 3 Encounters:  11/06/21 175 lb (79.4 kg)  07/24/21 181 lb 9.6 oz (82.4 kg)  07/05/20 235 lb (106.6 kg)    Physical Exam Vitals and nursing note reviewed.  Constitutional:      General: She is not in acute distress.    Appearance: Normal appearance. She is obese. She is not ill-appearing,  toxic-appearing or diaphoretic.  HENT:     Head: Normocephalic and atraumatic.     Mouth/Throat:     Mouth: Mucous membranes are moist.  Eyes:     Pupils: Pupils are equal, round, and reactive to light.  Cardiovascular:     Rate and Rhythm: Normal rate and regular rhythm.     Pulses: Normal pulses.     Heart sounds: Normal heart sounds.     Comments: Scattered varicose veins to bilateral lower legs starting at mid thigh, down to ankles. Pulmonary:     Effort: Pulmonary effort is normal.     Breath sounds: Normal breath sounds.  Musculoskeletal:     Right lower leg: Edema present.     Left lower leg: Edema present.  Skin:    General: Skin is warm and dry.  Capillary Refill: Capillary refill takes less than 2 seconds.     Findings: Rash (beefy red rash to abdominal skin folds) present.  Neurological:     General: No focal deficit present.     Mental Status: She is alert and oriented to person, place, and time.  Psychiatric:        Mood and Affect: Mood normal.        Behavior: Behavior normal.        Thought Content: Thought content normal.        Judgment: Judgment normal.     Results for orders placed or performed in visit on 07/05/20  Pregnancy, urine  Result Value Ref Range   Preg Test, Ur Negative Negative       Pertinent labs & imaging results that were available during my care of the patient were reviewed by me and considered in my medical decision making.  Assessment & Plan:  Rosy was seen today for rash on stomach, veins bilateral legs with pain.  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 Arising from GSV bilaterally. No rupture but significant venous claudication symptoms. Has not tried compression stockings, prescribed today with instructions on proper use. Referral to vascular surgery as pt will need venous insufficiency studies and possible venous ablation if compression stockings are not  beneficial.  -     For home use only DME Other see comment -     Ambulatory referral to Vascular Surgery  Yeast dermatitis Symptomatic care discussed in detail. Medications as prescribed. Preventative measures discussed in detail.  -     nystatin-triamcinolone ointment (MYCOLOG); Apply 1 Application topically 2 (two) times daily.  Mild intermittent reactive airway disease without complication Well controlled, needs albuterol refilled, sent to pharmacy.  -     albuterol (VENTOLIN HFA) 108 (90 Base) MCG/ACT inhaler; Inhale 2 puffs into the lungs every 6 (six) hours as needed for wheezing or shortness of breath.     Continue all other maintenance medications.  Follow up plan: Return if symptoms worsen or fail to improve.   Continue healthy lifestyle choices, including diet (rich in fruits, vegetables, and lean proteins, and low in salt and simple carbohydrates) and exercise (at least 30 minutes of moderate physical activity daily).  Educational handout given for chronic venous insufficiency  The above assessment and management plan was discussed with the patient. The patient verbalized understanding of and has agreed to the management plan. Patient is aware to call the clinic if they develop any new symptoms or if symptoms persist or worsen. Patient is aware when to return to the clinic for a follow-up visit. Patient educated on when it is appropriate to go to the emergency department.   Monia Pouch, FNP-C [facility] [phone]

## 2021-11-06 NOTE — Telephone Encounter (Signed)
Patient aware and verbalizes understanding. 

## 2021-11-06 NOTE — Telephone Encounter (Signed)
Nystatin Triamcinolone ointment not covered by patient's insurance.  Covered alternatives are:  Loprox cream generic Penlac solution generic Clotrimazole crem Clotrimazole-Betamethasone cream Ketoconazole cream Nyamyc powder Nystatin cream/ointment

## 2021-11-06 NOTE — Addendum Note (Signed)
Addended by: Baruch Gouty on: 11/06/2021 02:02 PM   Modules accepted: Orders

## 2021-11-19 ENCOUNTER — Encounter: Payer: Self-pay | Admitting: Family Medicine

## 2021-11-19 ENCOUNTER — Ambulatory Visit: Payer: Medicaid Other | Admitting: Family Medicine

## 2021-11-19 VITALS — BP 125/71 | HR 69 | Temp 97.0°F | Resp 20 | Ht 64.0 in | Wt 175.0 lb

## 2021-11-19 DIAGNOSIS — L130 Dermatitis herpetiformis: Secondary | ICD-10-CM | POA: Diagnosis not present

## 2021-11-19 MED ORDER — VALACYCLOVIR HCL 1 G PO TABS: 1000.0000 mg | ORAL_TABLET | Freq: Two times a day (BID) | ORAL | 0 refills | Status: DC

## 2021-11-19 NOTE — Progress Notes (Signed)
No chief complaint on file.   HPI  Patient presents today for blisters at left thigh near graoin. Rash started 3 days ago, blisters popped up 2 days ago. White and hard. Some pain and itching. No fever. No distant rash.   PMH: Smoking status noted ROS: Per HPI  Objective: Exam performed with Felicity Coyer as escort. BP 125/71   Pulse 69   Temp (!) 97 F (36.1 C) (Oral)   Resp 20   Ht '5\' 4"'$  (1.626 m)   Wt 175 lb (79.4 kg)   SpO2 100%   BMI 30.04 kg/m  Gen: NAD, alert, cooperative with exam HEENT: NCAT, Ext: No edema, warm Neuro: Alert and oriented, No gross deficits Skin" 5 mucopurulent vesicles, in a row, each measuring 2 mm. Mild surrounding erythema.  Assessment and plan:  1. Herpetiform eruption     Meds ordered this encounter  Medications   valACYclovir (VALTREX) 1000 MG tablet    Sig: Take 1 tablet (1,000 mg total) by mouth 2 (two) times daily.    Dispense:  14 tablet    Refill:  0    No orders of the defined types were placed in this encounter.   Follow up as needed.  Claretta Fraise, MD

## 2021-11-21 ENCOUNTER — Other Ambulatory Visit: Payer: Self-pay | Admitting: *Deleted

## 2021-11-21 DIAGNOSIS — I8393 Asymptomatic varicose veins of bilateral lower extremities: Secondary | ICD-10-CM

## 2021-11-27 NOTE — Progress Notes (Unsigned)
Requested by:  Baruch Gouty, [address] [place],  [place] [id number]  Reason for consultation: BLE varicose veins     History of Present Illness   Kelly Fry is a 36 y.o. (19-Feb-1985) female who presents for evaluation of evaluation of painful varicose veins. She explains that she has had spider veins/ varicose veins for many years. She says she has done work standing on hard floors or work that has required a lot of prolonged ambulation since she was 20. She reports that more recently her veins/ legs seem to be more painful. Her left is worse then right. Her left thigh in particular on lateral aspect where she has visible veins is very uncomfortable. Explains it feels like someone has punched her in the leg. Her legs also ache. She does have some mild swelling if she is up on her legs. She has tried elevation, compression, and heat without much improvement. She has a pair of thigh high compression that she has worn intermittently but they are very warm so she admits to not wearing them every day. She also is a mom to two young children so she says her time to elevate her legs is very limited. She denies any history of DVT. She does report that her grandmother had bad varicose veins.   Venous symptoms include: aching, swelling, throbbing pain Onset/duration:  many years  Occupation:  Development worker, community, Control and instrumentation engineer Aggravating factors: sitting, standing Alleviating factors: elevation Compression:  yes, thigh high Helps:  some Pain medications:  no Previous vein procedures:  no History of DVT:  no  Past Medical History:  Diagnosis Date   Ankle instability, right    Anxiety    Back pain    Bipolar disorder (HCC)    Complication of anesthesia    Depression    Dyspnea    Family history of adverse reaction to anesthesia    Dad had n/v post anesthesia   GERD (gastroesophageal reflux disease)    Leg edema    Medical history non-contributory    Miscarriage    PONV  (postoperative nausea and vomiting)    Vitamin D deficiency     Past Surgical History:  Procedure Laterality Date   ANKLE RECONSTRUCTION Right 02/21/2017   Procedure: RIGHT LATERAL ANKLE RECONSTRUCTION WITH SPLIT PERONEAL LONGUS TENDON;  Surgeon: Marybelle Killings, MD;  Location: [facility];  Service: Orthopedics;  Laterality: Right;   NO PAST SURGERIES     WISDOM TOOTH EXTRACTION  04/12/2015    Social History   Socioeconomic History   Marital status: Divorced    Spouse name: Kimber Relic   Number of children: 2   Years of education: Not on file   Highest education level: Not on file  Occupational History   Occupation: Stay at Home  Tobacco Use   Smoking status: Never    Passive exposure: Current   Smokeless tobacco: Never  Vaping Use   Vaping Use: Never used  Substance and Sexual Activity   Alcohol use: No   Drug use: No   Sexual activity: Yes    Birth control/protection: I.U.D.  Other Topics Concern   Not on file  Social History Narrative   Not on file   Social Determinants of Health   Financial Resource Strain: Not on file  Food Insecurity: Not on file  Transportation Needs: Not on file  Physical Activity: Not on file  Stress: Not on file  Social Connections: Not on file  Intimate Partner  Violence: Not on file    Family History  Problem Relation Age of Onset   Kidney disease Father    COPD Father    Hypertension Father    Depression Father    Sleep apnea Father    Obesity Father    Kidney disease Sister    Asthma Sister    Early death Mother    Stroke Mother    Heart disease Mother    Hypertension Mother    Alcoholism Mother    Asthma Brother     Current Outpatient Medications  Medication Sig Dispense Refill   albuterol (VENTOLIN HFA) 108 (90 Base) MCG/ACT inhaler Inhale 2 puffs into the lungs every 6 (six) hours as needed for wheezing or shortness of breath. 1 each 2   calcium-vitamin D (OSCAL WITH D) 500-200 MG-UNIT TABS tablet Take 1 tablet by  mouth daily.     cholecalciferol (VITAMIN D3) 25 MCG (1000 UNIT) tablet Take 1,000 Units by mouth daily.     clotrimazole-betamethasone (LOTRISONE) cream Apply 1 Application topically daily. 30 g 0   fluticasone (FLONASE) 50 MCG/ACT nasal spray Place 1 spray into both nostrils 2 (two) times daily as needed for allergies or rhinitis. 16 g 1   loratadine (CLARITIN) 10 MG tablet Take 1 tablet (10 mg total) by mouth daily. 30 tablet 1   Multiple Vitamin (MULTIVITAMIN) tablet Take 1 tablet by mouth 2 (two) times daily.     omeprazole (PRILOSEC) 40 MG capsule Take 1 capsule by mouth daily.     valACYclovir (VALTREX) 1000 MG tablet Take 1 tablet (1,000 mg total) by mouth 2 (two) times daily. 14 tablet 0   No current facility-administered medications for this visit.    Allergies  Allergen Reactions   Latuda [Lurasidone] Other (See Comments)    Shaky; worsened mood    REVIEW OF SYSTEMS (negative unless checked):   Cardiac:  '[]'$  Chest pain or chest pressure? '[]'$  Shortness of breath upon activity? '[]'$  Shortness of breath when lying flat? '[]'$  Irregular heart rhythm?  Vascular:  '[]'$  Pain in calf, thigh, or hip brought on by walking? '[]'$  Pain in feet at night that wakes you up from your sleep? '[]'$  Blood clot in your veins? '[]'$  Leg swelling?  Pulmonary:  '[]'$  Oxygen at home? '[]'$  Productive cough? '[]'$  Wheezing?  Neurologic:  '[]'$  Sudden weakness in arms or legs? '[]'$  Sudden numbness in arms or legs? '[]'$  Sudden onset of difficult speaking or slurred speech? '[]'$  Temporary loss of vision in one eye? '[]'$  Problems with dizziness?  Gastrointestinal:  '[]'$  Blood in stool? '[]'$  Vomited blood?  Genitourinary:  '[]'$  Burning when urinating? '[]'$  Blood in urine?  Psychiatric:  '[]'$  Major depression  Hematologic:  '[]'$  Bleeding problems? '[]'$  Problems with blood clotting?  Dermatologic:  '[]'$  Rashes or ulcers?  Constitutional:  '[]'$  Fever or chills?  Ear/Nose/Throat:  '[]'$  Change in hearing? '[]'$  Nose bleeds? '[]'$  Sore  throat?  Musculoskeletal:  '[]'$  Back pain? '[]'$  Joint pain? '[]'$  Muscle pain?   Physical Examination     Vitals:   11/29/21 0905  BP: 112/72  Pulse: 61  Resp: 20  Temp: 98.3 F (36.8 C)  TempSrc: Temporal  SpO2: 99%  Weight: 176 lb 9.6 oz (80.1 kg)  Height: '5\' 4"'$  (1.626 m)   Body mass index is 30.31 kg/m.  General:  WDWN in NAD; vital signs documented above Gait: Normal HENT: WNL, normocephalic Pulmonary: normal non-labored breathing  Cardiac: regular HR Vascular Exam/Pulses:2+ Dp and PT pulses bilaterally Extremities: with  varicose vein- larger varicosity on lateral left thigh that traverses down lateral  knee to lateral proximal calf, with reticular veins of bilateral thighs and popliteal fossa, non tender, without edema, without stasis pigmentation, without lipodermatosclerosis, without ulcers Musculoskeletal: no muscle wasting or atrophy  Neurologic: A&O X 3;  No focal weakness or paresthesias are detected Psychiatric:  The pt has Normal affect.  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 female who presents with: LLE chronic venous insufficiency with painful varicose and reticular veins. She does have large varicosity on left lateral thigh that is in area of most of her discomfort. I suspect this is branch off of her incompetent AASV. Her duplex today shows no DVT or SVT. She does have deep reflux in CFV, no GSV or SSV reflux. She does have large AASV that is incompetent.  Based on the patient's history and examination, I recommend: daily elevation above level of heart, compression stockings, exercise, and refraining from prolonged sitting or standing I discussed with the patient the use of her 20-30 mm thigh high compression  stockings and need for 3 month trial of such. The patient will follow up in 3 months with MD to discuss further possible ablation/ vein stripping   Karoline Caldwell, PA-C [facility]: [phone]  11/29/2021, 9:10 AM  Clinic MD: Donzetta Matters

## 2021-11-29 ENCOUNTER — Ambulatory Visit (INDEPENDENT_AMBULATORY_CARE_PROVIDER_SITE_OTHER): Payer: Medicaid Other | Admitting: Physician Assistant

## 2021-11-29 ENCOUNTER — Ambulatory Visit (HOSPITAL_COMMUNITY)
Admission: RE | Admit: 2021-11-29 | Discharge: 2021-11-29 | Disposition: A | Discharge status: Home / Self Care | Payer: Medicaid Other | Source: Ambulatory Visit | Attending: Vascular Surgery | Admitting: Vascular Surgery

## 2021-11-29 VITALS — BP 112/72 | HR 61 | Temp 98.3°F | Resp 20 | Ht 64.0 in | Wt 176.6 lb

## 2021-11-29 DIAGNOSIS — I872 Venous insufficiency (chronic) (peripheral): Secondary | ICD-10-CM

## 2021-11-29 DIAGNOSIS — I8393 Asymptomatic varicose veins of bilateral lower extremities: Secondary | ICD-10-CM | POA: Insufficient documentation

## 2022-02-18 ENCOUNTER — Emergency Department (HOSPITAL_BASED_OUTPATIENT_CLINIC_OR_DEPARTMENT_OTHER): Payer: Medicaid Other

## 2022-02-18 ENCOUNTER — Emergency Department (HOSPITAL_BASED_OUTPATIENT_CLINIC_OR_DEPARTMENT_OTHER)
Admission: EM | Admit: 2022-02-18 | Discharge: 2022-02-18 | Disposition: A | Discharge status: Home / Self Care | Payer: Medicaid Other | Attending: Emergency Medicine | Admitting: Emergency Medicine

## 2022-02-18 ENCOUNTER — Other Ambulatory Visit: Payer: Self-pay

## 2022-02-18 ENCOUNTER — Encounter (HOSPITAL_BASED_OUTPATIENT_CLINIC_OR_DEPARTMENT_OTHER): Payer: Self-pay

## 2022-02-18 DIAGNOSIS — K046 Periapical abscess with sinus: Secondary | ICD-10-CM | POA: Diagnosis not present

## 2022-02-18 DIAGNOSIS — K0889 Other specified disorders of teeth and supporting structures: Secondary | ICD-10-CM | POA: Diagnosis not present

## 2022-02-18 DIAGNOSIS — C760 Malignant neoplasm of head, face and neck: Secondary | ICD-10-CM | POA: Diagnosis not present

## 2022-02-18 DIAGNOSIS — R220 Localized swelling, mass and lump, head: Secondary | ICD-10-CM | POA: Diagnosis present

## 2022-02-18 LAB — BASIC METABOLIC PANEL WITH GFR
Anion gap: 7 (ref 5–15)
BUN: 13 mg/dL (ref 6–20)
CO2: 25 mmol/L (ref 22–32)
Calcium: 9.1 mg/dL (ref 8.9–10.3)
Chloride: 107 mmol/L (ref 98–111)
Creatinine, Ser: 0.74 mg/dL (ref 0.44–1.00)
GFR, Estimated: >60 mL/min
Glucose, Bld: 89 mg/dL (ref 70–99)
Potassium: 3.5 mmol/L (ref 3.5–5.1)
Sodium: 139 mmol/L (ref 135–145)

## 2022-02-18 LAB — CBC WITH DIFFERENTIAL/PLATELET
Abs Immature Granulocytes: 0.02 K/uL (ref 0.00–0.07)
Basophils Absolute: 0 K/uL (ref 0.0–0.1)
Basophils Relative: 1 %
Eosinophils Absolute: 0.1 K/uL (ref 0.0–0.5)
Eosinophils Relative: 1 %
HCT: 38.3 % (ref 36.0–46.0)
Hemoglobin: 12.9 g/dL (ref 12.0–15.0)
Immature Granulocytes: 0 %
Lymphocytes Relative: 17 %
Lymphs Abs: 1.4 K/uL (ref 0.7–4.0)
MCH: 29.7 pg (ref 26.0–34.0)
MCHC: 33.7 g/dL (ref 30.0–36.0)
MCV: 88.2 fL (ref 80.0–100.0)
Monocytes Absolute: 0.7 K/uL (ref 0.1–1.0)
Monocytes Relative: 8 %
Neutro Abs: 6.2 K/uL (ref 1.7–7.7)
Neutrophils Relative %: 73 %
Platelets: 211 K/uL (ref 150–400)
RBC: 4.34 MIL/uL (ref 3.87–5.11)
RDW: 12.8 % (ref 11.5–15.5)
WBC: 8.5 K/uL (ref 4.0–10.5)
nRBC: 0 % (ref 0.0–0.2)

## 2022-02-18 LAB — HCG, SERUM, QUALITATIVE: Preg, Serum: NEGATIVE

## 2022-02-18 MED ORDER — MORPHINE SULFATE (PF) 4 MG/ML IV SOLN
4.0000 mg | Freq: Once | INTRAVENOUS | Status: AC
  Administered 2022-02-18: 4 mg via INTRAVENOUS
  Filled 2022-02-18: qty 1

## 2022-02-18 MED ORDER — HYDROCODONE-ACETAMINOPHEN 7.5-325 MG PO TABS: 1.0000 | ORAL_TABLET | Freq: Four times a day (QID) | ORAL | 0 refills | Status: DC | PRN

## 2022-02-18 MED ORDER — IOHEXOL 300 MG/ML  SOLN
100.0000 mL | Freq: Once | INTRAMUSCULAR | Status: AC | PRN
  Administered 2022-02-18: 75 mL via INTRAVENOUS

## 2022-02-18 MED ORDER — SODIUM CHLORIDE 0.9 % IV BOLUS
500.0000 mL | Freq: Once | INTRAVENOUS | Status: AC
  Administered 2022-02-18: 500 mL via INTRAVENOUS

## 2022-02-18 MED ORDER — AMOXICILLIN-POT CLAVULANATE 875-125 MG PO TABS: 1.0000 | ORAL_TABLET | Freq: Two times a day (BID) | ORAL | 0 refills | Status: AC

## 2022-02-18 NOTE — Discharge Instructions (Addendum)
You have been seen and discharged from the emergency department.  Your CAT scan showed a small area that is most likely an abscess causing your pain/swelling.  This may be related to dental disease.  Take antibiotic as directed, take pain medicine as needed.  He may take ibuprofen in addition to this pain medicine.  Do not mix this medication with alcohol or other sedating medications. Do not drive or do heavy physical activity until you know how this medication affects you.  It may cause drowsiness.    Follow-up with your facial surgeon at scheduled appointment for further evaluation and further care. Take home medications as prescribed. If you have any worsening symptoms, worsening swelling, difficulty swallowing/breathing, high fevers or further concerns for your health please return to an emergency department for further evaluation.

## 2022-02-18 NOTE — ED Triage Notes (Signed)
Patient has swelling on right side of face starting this morning. Pt reports that she was diagnosed with a small mass on inside of right cheek 6 years ago and that swelling/pain has come and gone intermittently during that time. Pt reports it never having gotten this bad. Pt denies fever, drainage, open sores. Pt has appointment set up to have mass evaluated on 1/17.

## 2022-02-18 NOTE — ED Notes (Signed)
Patient transported to CT 

## 2022-02-18 NOTE — ED Provider Notes (Signed)
[facility] EMERGENCY DEPT Provider Note   CSN: [id number] Arrival date & time: 02/18/22  0859     History  Chief Complaint  Patient presents with   Facial Swelling    Kelly Fry is a 37 y.o. female.  HPI   37 year old female presents the emergency department with right cheek swelling.  Patient states this has been a chronic issue and recently has gotten significantly worse.  She followed up with a dentist who "did an x-ray and saw a dark mass".  She was referred to a facial surgeon and has a follow-up appointment next week.  No history of salivary gland stones.  Denies any dental injury/disease.  No swelling underneath her chin or along her neck.  No fever.  No difficulty breathing or swallowing.  No intraoral lesions or ulcers.  Patient presents today because the pain is excruciating.  Home Medications Prior to Admission medications   Medication Sig Start Date End Date Taking? Authorizing Provider  albuterol (VENTOLIN HFA) 108 (90 Base) MCG/ACT inhaler Inhale 2 puffs into the lungs every 6 (six) hours as needed for wheezing or shortness of breath. 11/06/21   Baruch Gouty, FNP  calcium-vitamin D (OSCAL WITH D) 500-200 MG-UNIT TABS tablet Take 1 tablet by mouth daily.    [provider]  cholecalciferol (VITAMIN D3) 25 MCG (1000 UNIT) tablet Take 1,000 Units by mouth daily.    [provider]  clotrimazole-betamethasone (LOTRISONE) cream Apply 1 Application topically daily. 11/06/21   Baruch Gouty, FNP  fluticasone (FLONASE) 50 MCG/ACT nasal spray Place 1 spray into both nostrils 2 (two) times daily as needed for allergies or rhinitis. 06/04/21   Dettinger, Fransisca Kaufmann, MD  loratadine (CLARITIN) 10 MG tablet Take 1 tablet (10 mg total) by mouth daily. 06/04/21   Dettinger, Fransisca Kaufmann, MD  Multiple Vitamin (MULTIVITAMIN) tablet Take 1 tablet by mouth 2 (two) times daily.    [provider]  omeprazole (PRILOSEC) 40 MG capsule Take 1 capsule by  mouth daily. 06/05/20   [provider]  valACYclovir (VALTREX) 1000 MG tablet Take 1 tablet (1,000 mg total) by mouth 2 (two) times daily. 11/19/21   Claretta Fraise, MD      Allergies    Anette Guarneri [lurasidone]    Review of Systems   Review of Systems  Constitutional:  Negative for fever.  HENT:  Positive for facial swelling. Negative for dental problem, drooling, mouth sores, trouble swallowing and voice change.   Respiratory:  Negative for shortness of breath.   Cardiovascular:  Negative for chest pain.  Skin:  Negative for wound.  Neurological:  Negative for headaches.    Physical Exam Updated Vital Signs BP (!) 130/90 (BP Location: Right Arm)   Pulse 72   Temp 98.8 F (37.1 C) (Oral)   Resp 15   Ht '5\' 4"'$  (1.626 m)   Wt 79.4 kg   SpO2 100%   BMI 30.04 kg/m  Physical Exam Vitals and nursing note reviewed.  Constitutional:      General: She is not in acute distress.    Appearance: Normal appearance.  HENT:     Head: Normocephalic.     Comments: Right anterior cheek swelling more so around maxillary sinus, mild warm, no lateral swelling along salivary gland, palpable masslike area on the anterior cheek.  No submandibular swelling/skin changes    Mouth/Throat:     Mouth: Mucous membranes are moist.     Comments: No intraoral lesions, dentition intact, tongue unremarkable, no  swelling underneath the tongue Cardiovascular:     Rate and Rhythm: Normal rate.  Pulmonary:     Effort: Pulmonary effort is normal. No respiratory distress.  Abdominal:     Palpations: Abdomen is soft.     Tenderness: There is no abdominal tenderness.  Musculoskeletal:     Cervical back: No rigidity.  Skin:    General: Skin is warm.  Neurological:     Mental Status: She is alert and oriented to person, place, and time. Mental status is at baseline.  Psychiatric:        Mood and Affect: Mood normal.     ED Results / Procedures / Treatments   Labs (all labs ordered are listed, but  only abnormal results are displayed) Labs Reviewed  CBC WITH DIFFERENTIAL/PLATELET  BASIC METABOLIC PANEL  HCG, SERUM, QUALITATIVE    EKG None  Radiology No results found.  Procedures Procedures    Medications Ordered in ED Medications  sodium chloride 0.9 % bolus 500 mL (500 mLs Intravenous New Bag/Given 02/18/22 1010)  morphine (PF) 4 MG/ML injection 4 mg (4 mg Intravenous Given 02/18/22 1009)    ED Course/ Medical Decision Making/ A&P                           Medical Decision Making Amount and/or Complexity of Data Reviewed Labs: ordered. Radiology: ordered.  Risk Prescription drug management.   37 year old female presents emergency department with right cheek swelling and pain.  Afebrile.  Has follow-up with a oral surgeon next week.  Blood work is reassuring.  CT scan identifies a probable small abscess along the right maxilla most likely translating with dental disease.  Intraoral evaluation is limited but reassuring.  No obvious signs of cavities/loose teeth/cracked teeth.  Tongue and exam is otherwise unremarkable.  No swelling extending to the submandibular region.  She is able to talk in swallow without difficulty.  Plan to treat with antibiotics, pain medicine and outpatient follow-up with oral surgeon.  Patient at this time appears safe and stable for discharge and close outpatient follow up. Discharge plan and strict return to ED precautions discussed, patient verbalizes understanding and agreement.        Final Clinical Impression(s) / ED Diagnoses Final diagnoses:  None    Rx / DC Orders ED Discharge Orders     None         Lorelle Gibbs, DO 02/18/22 1232

## 2022-03-06 ENCOUNTER — Ambulatory Visit: Payer: Medicaid Other | Admitting: Vascular Surgery

## 2022-03-18 ENCOUNTER — Ambulatory Visit: Payer: Medicaid Other | Admitting: Surgery

## 2022-03-25 ENCOUNTER — Encounter: Payer: Self-pay | Admitting: Surgery

## 2022-03-25 ENCOUNTER — Ambulatory Visit: Payer: Medicaid Other | Admitting: Surgery

## 2022-03-25 VITALS — BP 129/68 | HR 68 | Temp 98.1°F | Resp 16 | Ht 64.0 in | Wt 174.0 lb

## 2022-03-25 DIAGNOSIS — I872 Venous insufficiency (chronic) (peripheral): Secondary | ICD-10-CM | POA: Diagnosis not present

## 2022-03-25 NOTE — Progress Notes (Signed)
[facility] [place]  Patient name: Kelly Fry MRN: [id number] DOB: [birth year] Sex: female   REASON FOR VISIT:    Follow up  HISOTRY OF PRESENT ILLNESS:    Kelly Fry is a 37 y.o. female who returns today for follow-up evaluation of painful varicose veins.  She states that these been present for many years she has longstanding history of prolonged ambulation and standing on hard floors since she was 20.  Her legs have bothered her more recently.  The left is worse than the right.  On the lateral aspect of the left leg where she has visible veins is the most painful area.  She describes the feeling as if being punched in the leg.  She does have mild swelling when she is standing on her feet.  She keeps her legs elevated when possible.  She was fitted for 20-30 thigh-high compression stockings at her last visit.  She is back for follow-up.  She denies any history of DVT.  There is a family history of varicose veins in her grandmother.   She did get relief wearing compression socks.  The 1 vein that causes her the most pain on the lateral aspect of her left thigh is not bothering her.   PAST MEDICAL HISTORY:   Past Medical History:  Diagnosis Date   Ankle instability, right    Anxiety    Back pain    Bipolar disorder (HCC)    Complication of anesthesia    Depression    Dyspnea    Family history of adverse reaction to anesthesia    Dad had n/v post anesthesia   GERD (gastroesophageal reflux disease)    Leg edema    Medical history non-contributory    Miscarriage    PONV (postoperative nausea and vomiting)    Vitamin D deficiency      FAMILY HISTORY:   Family History  Problem Relation Age of Onset   Kidney disease Father    COPD Father    Hypertension Father    Depression Father    Sleep apnea Father    Obesity Father    Kidney disease Sister    Asthma Sister    Early death Mother    Stroke Mother     Heart disease Mother    Hypertension Mother    Alcoholism Mother    Asthma Brother     SOCIAL HISTORY:   Social History   Tobacco Use   Smoking status: Never    Passive exposure: Current   Smokeless tobacco: Never  Substance Use Topics   Alcohol use: No     ALLERGIES:   Allergies  Allergen Reactions   Latuda [Lurasidone] Other (See Comments)    Shaky; worsened mood     CURRENT MEDICATIONS:   Current Outpatient Medications  Medication Sig Dispense Refill   albuterol (VENTOLIN HFA) 108 (90 Base) MCG/ACT inhaler Inhale 2 puffs into the lungs every 6 (six) hours as needed for wheezing or shortness of breath. 1 each 2   calcium-vitamin D (OSCAL WITH D) 500-200 MG-UNIT TABS tablet Take 1 tablet by mouth daily.     cholecalciferol (VITAMIN D3) 25 MCG (1000 UNIT) tablet Take 1,000 Units by mouth daily.     clotrimazole-betamethasone (LOTRISONE) cream Apply 1 Application topically daily. 30 g 0   fluticasone (FLONASE) 50 MCG/ACT nasal spray Place 1 spray into both nostrils 2 (two) times daily as needed for allergies or rhinitis. 16 g 1   HYDROcodone-acetaminophen (  NORCO) 7.5-325 MG tablet Take 1 tablet by mouth every 6 (six) hours as needed for moderate pain. 15 tablet 0   loratadine (CLARITIN) 10 MG tablet Take 1 tablet (10 mg total) by mouth daily. 30 tablet 1   Multiple Vitamin (MULTIVITAMIN) tablet Take 1 tablet by mouth 2 (two) times daily.     omeprazole (PRILOSEC) 40 MG capsule Take 1 capsule by mouth daily.     valACYclovir (VALTREX) 1000 MG tablet Take 1 tablet (1,000 mg total) by mouth 2 (two) times daily. 14 tablet 0   No current facility-administered medications for this visit.    REVIEW OF SYSTEMS:   [X]$  denotes positive finding, [ ]$  denotes negative finding Cardiac  Comments:  Chest pain or chest pressure:    Shortness of breath upon exertion:    Short of breath when lying flat:    Irregular heart rhythm:        Vascular    Pain in calf, thigh, or hip  brought on by ambulation:    Pain in feet at night that wakes you up from your sleep:     Blood clot in your veins:    Leg swelling:         Pulmonary    Oxygen at home:    Productive cough:     Wheezing:         Neurologic    Sudden weakness in arms or legs:     Sudden numbness in arms or legs:     Sudden onset of difficulty speaking or slurred speech:    Temporary loss of vision in one eye:     Problems with dizziness:         Gastrointestinal    Blood in stool:     Vomited blood:         Genitourinary    Burning when urinating:     Blood in urine:        Psychiatric    Major depression:         Hematologic    Bleeding problems:    Problems with blood clotting too easily:        Skin    Rashes or ulcers:        Constitutional    Fever or chills:      PHYSICAL EXAM:   There were no vitals filed for this visit.  GENERAL: The patient is a well-nourished female, in no acute distress. The vital signs are documented above. CARDIAC: There is a regular rate and rhythm.  VASCULAR: I evaluated her saphenous vein with the SonoSite.  It was very small today and I did not feel was amenable to intervention. PULMONARY: Non-labored respirations ABDOMEN: Soft and non-tender with normal pitched bowel sounds.  MUSCULOSKELETAL: There are no major deformities or cyanosis. NEUROLOGIC: No focal weakness or paresthesias are detected. SKIN: There are no ulcers or rashes noted. PSYCHIATRIC: The patient has a normal affect.  STUDIES:   Reviewed her reflux study with the following findings: +  LEFT         Reflux NoRefluxReflux TimeDiameter cmsComments                          Yes                                   +--------------+---------+------+-----------+------------+--------+  CFV  yes   >1 second                       +--------------+---------+------+-----------+------------+--------+  FV mid        no                                               +--------------+---------+------+-----------+------------+--------+  Popliteal    no                                              +--------------+---------+------+-----------+------------+--------+  GSV at [facility]    no                            0.72              +--------------+---------+------+-----------+------------+--------+  GSV prox thighno                            0.46              +--------------+---------+------+-----------+------------+--------+  GSV mid thigh no                            0.33              +--------------+---------+------+-----------+------------+--------+  GSV dist thighno                            0.30              +--------------+---------+------+-----------+------------+--------+  GSV at knee   no                            0.34              +--------------+---------+------+-----------+------------+--------+  GSV prox calf no                            0.19              +--------------+---------+------+-----------+------------+--------+  GSV mid calf  no                            0.17              +--------------+---------+------+-----------+------------+--------+  SSV Pop Fossa no                            0.22              +--------------+---------+------+-----------+------------+--------+  SSV prox calf no                            0.19              +--------------+---------+------+-----------+------------+--------+  SSV mid calf  no                            0.27              +--------------+---------+------+-----------+------------+--------+  AASV O                  yes    >500 ms      0.48              +--------------+---------+------+-----------+------------+--------+  AASV p                  yes    >500 ms      0.44              +--------------+---------+------+-----------+------------+--------+  AASV mid                yes    >500 ms       0.40              +--------------+---------+------+-----------+------------+--------+    MEDICAL ISSUES:   Venous insufficiency, left greater than right: On exam today with the SonoSite, the vein is too small to warrant treatment.  I doubt that this would give her significant relief.  I offered sclerotherapy for the painful reticular vein on her left lateral thigh, however she says that with wearing compression socks, this does not bother her anymore.  Therefore no treatment is recommended at this time.  I told her that this may get worse over time and should her symptoms change, she should reach out to [place] again in the future.    Leia Alf, MD, FACS [facility] [phone] Pager [phone]

## 2022-03-29 DIAGNOSIS — J0100 Acute maxillary sinusitis, unspecified: Secondary | ICD-10-CM | POA: Diagnosis not present

## 2022-03-29 DIAGNOSIS — J029 Acute pharyngitis, unspecified: Secondary | ICD-10-CM | POA: Diagnosis not present

## 2022-04-11 ENCOUNTER — Other Ambulatory Visit: Payer: Self-pay | Admitting: Family Medicine

## 2022-04-11 DIAGNOSIS — J301 Allergic rhinitis due to pollen: Secondary | ICD-10-CM

## 2022-05-24 ENCOUNTER — Encounter: Payer: Self-pay | Admitting: Family

## 2022-05-24 ENCOUNTER — Ambulatory Visit: Payer: Medicaid Other | Admitting: Family

## 2022-05-24 VITALS — BP 119/82 | HR 73 | Temp 98.0°F | Ht 64.0 in | Wt 177.0 lb

## 2022-05-24 DIAGNOSIS — G8929 Other chronic pain: Secondary | ICD-10-CM | POA: Diagnosis not present

## 2022-05-24 DIAGNOSIS — M5442 Lumbago with sciatica, left side: Secondary | ICD-10-CM

## 2022-05-24 MED ORDER — DICLOFENAC SODIUM 75 MG PO TBEC: 75.0000 mg | DELAYED_RELEASE_TABLET | Freq: Two times a day (BID) | ORAL | 0 refills | Status: DC

## 2022-05-24 NOTE — Progress Notes (Signed)
Subjective:    Patient ID: Kelly Fry, female    DOB: 1985-02-26, 37 y.o.   MRN: [id number]  Chief Complaint  Patient presents with   Back Pain    SINCE SHE WAS 20   PT presents to the office today with back pain since age 59 years old. Denies any recent injury.  She has seen chiropractor, she lost 123 lbs, and completed PT without relief. She has hx of bariatric surgery.  Back Pain This is a chronic problem. The current episode started more than 1 year ago. The problem occurs intermittently. The problem has been waxing and waning since onset. The pain is present in the lumbar spine. The quality of the pain is described as aching. The pain is at a severity of 8/10. The pain is moderate. The symptoms are aggravated by bending and twisting. Associated symptoms include leg pain (left). Risk factors include obesity. She has tried home exercises (TENS) for the symptoms. The treatment provided mild relief.      Review of Systems  Musculoskeletal:  Positive for back pain.  All other systems reviewed and are negative.      Objective:   Physical Exam Vitals reviewed.  Constitutional:      General: She is not in acute distress.    Appearance: She is well-developed.  HENT:     Head: Normocephalic and atraumatic.  Eyes:     Pupils: Pupils are equal, round, and reactive to light.  Neck:     Thyroid: No thyromegaly.  Cardiovascular:     Rate and Rhythm: Normal rate and regular rhythm.     Heart sounds: Normal heart sounds. No murmur heard. Pulmonary:     Effort: Pulmonary effort is normal. No respiratory distress.     Breath sounds: Normal breath sounds. No wheezing.  Abdominal:     General: Bowel sounds are normal. There is no distension.     Palpations: Abdomen is soft.     Tenderness: There is no abdominal tenderness.  Musculoskeletal:        General: No tenderness. Normal range of motion.     Cervical back: Normal range of motion and neck supple.     Comments: Full ROM,  pain in lumbar with flexion  Skin:    General: Skin is warm and dry.  Neurological:     Mental Status: She is alert and oriented to person, place, and time.     Cranial Nerves: No cranial nerve deficit.     Deep Tendon Reflexes: Reflexes are normal and symmetric.  Psychiatric:        Behavior: Behavior normal.        Thought Content: Thought content normal.        Judgment: Judgment normal.       BP 119/82   Pulse 73   Temp 98 F (36.7 C) (Temporal)   Ht  (1.626 m)   Wt 177 lb (80.3 kg)   BMI 30.38 kg/m      Assessment & Plan:  Kelly Fry comes in today with chief complaint of Back Pain (SINCE SHE WAS 20)   Diagnosis and orders addressed:  1. Chronic bilateral low back pain with left-sided sciatica Rest ROM exercises  Diclofenac BID with food for next 7-10 days X-ray pending  May need MRI given length, referral to Ortho pending  - diclofenac (VOLTAREN) 75 MG EC tablet; Take 1 tablet (75 mg total) by mouth 2 (two) times daily.  Dispense: 30 tablet; Refill:  0 - Ambulatory referral to Orthopedic Surgery - DG Lumbar Spine 2-3 Views; Future   Kelly Rodney, FNP

## 2022-05-24 NOTE — Patient Instructions (Signed)

## 2022-05-27 ENCOUNTER — Telehealth: Payer: Self-pay

## 2022-05-27 MED ORDER — NAPROXEN 500 MG PO TABS: 500.0000 mg | ORAL_TABLET | Freq: Two times a day (BID) | ORAL | 1 refills | Status: DC

## 2022-05-27 NOTE — Telephone Encounter (Signed)
Diclofenac changed to naproxen per insurance. No other other NSAID's

## 2022-05-27 NOTE — Telephone Encounter (Signed)
Insurance will not pay for Diclofenac.  Covered alternatives are:  Celecoxib capsule Ibuprofen tablet Indomethacin capsule Ketorolac tablet Meloxicam tablet Naproxen EC/DR tablet Naproxen tablet Sulindac tablet

## 2022-05-27 NOTE — Telephone Encounter (Signed)
Patient aware and verbalized understanding. °

## 2022-05-28 ENCOUNTER — Telehealth: Payer: Self-pay | Admitting: Family

## 2022-05-28 ENCOUNTER — Ambulatory Visit (INDEPENDENT_AMBULATORY_CARE_PROVIDER_SITE_OTHER): Payer: Medicaid Other

## 2022-05-28 DIAGNOSIS — M5442 Lumbago with sciatica, left side: Secondary | ICD-10-CM | POA: Diagnosis not present

## 2022-05-28 DIAGNOSIS — G8929 Other chronic pain: Secondary | ICD-10-CM

## 2022-05-28 DIAGNOSIS — M5440 Lumbago with sciatica, unspecified side: Secondary | ICD-10-CM | POA: Diagnosis not present

## 2022-05-28 NOTE — Telephone Encounter (Signed)
Patient had an appt on 4/12 with PCP and needed an x ray - please call back to schedule

## 2022-05-28 NOTE — Telephone Encounter (Signed)
Called and appt made for today

## 2022-06-05 ENCOUNTER — Ambulatory Visit (INDEPENDENT_AMBULATORY_CARE_PROVIDER_SITE_OTHER): Payer: Medicaid Other | Admitting: Orthopaedic Surgery

## 2022-06-05 ENCOUNTER — Encounter: Payer: Self-pay | Admitting: Orthopaedic Surgery

## 2022-06-05 VITALS — Ht 64.0 in | Wt 177.0 lb

## 2022-06-05 DIAGNOSIS — G8929 Other chronic pain: Secondary | ICD-10-CM | POA: Diagnosis not present

## 2022-06-05 DIAGNOSIS — M545 Low back pain, unspecified: Secondary | ICD-10-CM
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
## 2022-06-07 ENCOUNTER — Telehealth: Payer: Self-pay | Admitting: Orthopaedic Surgery

## 2022-06-13 ENCOUNTER — Ambulatory Visit: Payer: Medicaid Other | Attending: Orthopaedic Surgery

## 2022-06-13 ENCOUNTER — Other Ambulatory Visit: Payer: Self-pay

## 2022-06-13 DIAGNOSIS — G8929 Other chronic pain: Secondary | ICD-10-CM | POA: Diagnosis not present

## 2022-06-13 DIAGNOSIS — M6281 Muscle weakness (generalized): Secondary | ICD-10-CM | POA: Diagnosis not present

## 2022-06-13 DIAGNOSIS — M545 Low back pain, unspecified: Secondary | ICD-10-CM | POA: Diagnosis not present

## 2022-06-13 DIAGNOSIS — M5416 Radiculopathy, lumbar region: Secondary | ICD-10-CM | POA: Diagnosis not present
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
## 2022-06-27 ENCOUNTER — Ambulatory Visit: Payer: Medicaid Other

## 2022-06-27 DIAGNOSIS — M545 Low back pain, unspecified: Secondary | ICD-10-CM | POA: Diagnosis not present

## 2022-06-27 DIAGNOSIS — Z903 Acquired absence of stomach [part of]: Secondary | ICD-10-CM | POA: Diagnosis not present

## 2022-06-27 DIAGNOSIS — M6281 Muscle weakness (generalized): Secondary | ICD-10-CM | POA: Diagnosis not present

## 2022-06-27 DIAGNOSIS — K912 Postsurgical malabsorption, not elsewhere classified: Secondary | ICD-10-CM | POA: Diagnosis not present

## 2022-06-27 DIAGNOSIS — G8929 Other chronic pain: Secondary | ICD-10-CM | POA: Diagnosis not present

## 2022-06-27 DIAGNOSIS — Z9884 Bariatric surgery status: Secondary | ICD-10-CM | POA: Diagnosis not present

## 2022-06-27 DIAGNOSIS — M5416 Radiculopathy, lumbar region: Secondary | ICD-10-CM

## 2022-06-27 NOTE — Therapy (Signed)
OUTPATIENT PHYSICAL THERAPY THORACOLUMBAR TREATMENT   Patient Name: Kelly Fry MRN: [id number] DOB:[birth year], 37 y.o., female Today's Date: 06/27/2022  END OF SESSION:  PT End of Session - 06/27/22 1118     Visit Number 2    Number of Visits 12    Date for PT Re-Evaluation 09/06/22    PT Start Time 1115    PT Stop Time 1157    PT Time Calculation (min) 42 min    Activity Tolerance Patient tolerated treatment well    Behavior During Therapy WFL for tasks assessed/performed             Past Medical History:  Diagnosis Date   Ankle instability, right    Anxiety    Back pain    Bipolar disorder (HCC)    Complication of anesthesia    Depression    Dyspnea    Family history of adverse reaction to anesthesia    Dad had n/v post anesthesia   GERD (gastroesophageal reflux disease)    Leg edema    Medical history non-contributory    Miscarriage    PONV (postoperative nausea and vomiting)    Vitamin D deficiency    Past Surgical History:  Procedure Laterality Date   ANKLE RECONSTRUCTION Right 02/21/2017   Procedure: RIGHT LATERAL ANKLE RECONSTRUCTION WITH SPLIT PERONEAL LONGUS TENDON;  Surgeon: Eldred Manges, MD;  Location: [facility] OR;  Service: Orthopedics;  Laterality: Right;   NO PAST SURGERIES     WISDOM TOOTH EXTRACTION  04/12/2015   Patient Active Problem List   Diagnosis Date Noted   History of Roux-en-Y gastric bypass 05/18/2020   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   Post-operative nausea and vomiting 05/08/2020   Hypertriglyceridemia 12/23/2019   Obstructive sleep apnea syndrome, mild 12/22/2019   Metabolic syndrome 12/13/2019   Bipolar 1 disorder (HCC) 12/07/2019   Depressed mood 10/11/2019   Seasonal allergies 05/19/2019   Gastroesophageal reflux disease 04/07/2019   Insulin resistance 11/19/2018   Depression with anxiety 11/19/2018   Class 3 severe obesity with serious comorbidity and body mass index (BMI) of 50.0 to 59.9 in adult (HCC)  11/19/2018   Chronic bilateral low back pain with left-sided sciatica 09/17/2018   Chronic midline thoracic back pain 09/17/2018   Ankle instability, 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   Right ankle instability 01/09/2017   Vitamin D deficiency 07/04/2015   GAD (generalized anxiety disorder) 06/30/2015   Depression 03/28/2015   Morbid obesity with BMI of 45.0-49.9, adult (HCC) 12/21/2014   Rh negative, antepartum 06/03/2013   REFERRING PROVIDER: Eldred Manges, MD   REFERRING DIAG: Chronic bilateral low back pain, unspecified whether sciatica present   Rationale for Evaluation and Treatment: Rehabilitation  THERAPY DIAG:  Radiculopathy, lumbar region  Muscle weakness (generalized)  ONSET DATE: 18 years ago   SUBJECTIVE:  SUBJECTIVE STATEMENT: Pt reports minimal low back pain today, 2/10.   PERTINENT HISTORY:  Depression, anxiety, allergies, and bipolar disorder  PAIN:  Are you having pain? Yes: NPRS scale: 2/10 Pain location: low back, left hip, and bilateral hamstrings Pain description: aching, intermittent burning, and numbness Aggravating factors: sitting (30-60 minutes), standing (30-60 minutes)  Relieving factors: TENS unit (temporary), heat  PRECAUTIONS: None  WEIGHT BEARING RESTRICTIONS: No  FALLS:  Has patient fallen in last 6 months? No  LIVING ENVIRONMENT: Lives with: lives with their family Lives in: House/apartment Stairs: No Has following equipment at home: None  OCCUPATION: begins a new job on 06/17/22 at [employer] service  PLOF: Independent, but has increased difficulty  PATIENT GOALS: reduced pain, be able to sleep better (4-5 hours before being awakened due to pain), be able to sit and stand longer  NEXT MD VISIT: none scheduled  OBJECTIVE:   DIAGNOSTIC FINDINGS: 05/28/22 lumbar  x-ray IMPRESSION: Slight L5-S1 disc space narrowing. Otherwise negative radiographs of the lumbar spine.  PATIENT SURVEYS:  Modified Oswestry 28%   SCREENING FOR RED FLAGS: Bowel or bladder incontinence: No Spinal tumors: No Cauda equina syndrome: No Compression fracture: No Abdominal aneurysm: No  COGNITION: Overall cognitive status: Within functional limits for tasks assessed     SENSATION: Patient has began to notice rare numbness in both hips  POSTURE: increased lumbar lordosis  PALPATION: Familiar pain reproduced with palpation to: left lumbar paraspinals, gluteals, and piriformis  LUMBAR JOINT MOBILITY:  Hypomobile throughout with pain at L3-S1   LUMBAR ROM:   AROM eval  Flexion 80  Extension 20; midline low back pain  Right lateral flexion 22; pulling into left hip   Left lateral flexion 26  Right rotation 25% limited  Left rotation 50% limited; familiar low back pain   (Blank rows = not tested)  LOWER EXTREMITY ROM: WFL for activities assessed  LOWER EXTREMITY MMT:    MMT Right eval Left eval  Hip flexion 4/5 4/5  Hip extension    Hip abduction    Hip adduction    Hip internal rotation    Hip external rotation    Knee flexion 4-/5 4/5  Knee extension 4-/5 4/5  Ankle dorsiflexion 4/5 4/5  Ankle plantarflexion    Ankle inversion    Ankle eversion     (Blank rows = not tested)  LUMBAR SPECIAL TESTS:  Straight leg raise test: Negative and FABER test: Negative  GAIT: Assistive device utilized: None Level of assistance: Complete Independence Comments: Decreased gait speed and hip extension bilaterally  TODAY'S TREATMENT:                                                                                                                              DATE:  EXERCISE LOG  Exercise Repetitions and Resistance Comments  Nustep Lvl 3 x 15 mins   [employer] 3 mins with 3 sec hold   [employer] out 3 mins with 3 sec  hold   LAQs 2# x 20 reps bil   Seated Marches 2# x 20 reps bil   Ball Squeeze 2 mins   Clams Red 2 mins   Ham Curls Red x 20 reps bil    Blank cell = exercise not performed today   PATIENT EDUCATION:  Education details: Healing, prognosis, anatomy, plan of care, and goals for therapy Person educated: Patient Education method: Explanation Education comprehension: verbalized understanding  HOME EXERCISE PROGRAM:   ASSESSMENT:  CLINICAL IMPRESSION: Pt arrives for today's treatment session reporting 2/10 low back pain.  Pt able to tolerate Nustep for warm up today without complaint of increased pain.  Pt instructed in seated and standing exercises to increase strength and function while decreasing pain.  Pt requiring min cues for proper technique and posture with all newly added exercises.  Pt denied any change in pain at completion of today's treatment session.  OBJECTIVE IMPAIRMENTS: Abnormal gait, decreased activity tolerance, decreased mobility, difficulty walking, decreased ROM, decreased strength, hypomobility, impaired sensation, impaired tone, postural dysfunction, and pain.   ACTIVITY LIMITATIONS: carrying, lifting, bending, sitting, standing, sleeping, locomotion level, and caring for others  PARTICIPATION LIMITATIONS: meal prep, cleaning, laundry, shopping, community activity, and yard work  PERSONAL FACTORS: Past/current experiences, Time since onset of injury/illness/exacerbation, and 3+ comorbidities: Depression, anxiety, allergies, and bipolar disorder  are also affecting patient's functional outcome.   REHAB POTENTIAL: Fair    CLINICAL DECISION MAKING: Evolving/moderate complexity  EVALUATION COMPLEXITY: Moderate   GOALS: Goals reviewed with patient? Yes  SHORT TERM GOALS: Target date: 07/04/22  Patient will be independent with her initial HEP. Baseline: Goal status: INITIAL  2.  Patient will be able to complete her daily activities without her familiar pain  exceeding 8/10. Baseline:  Goal status: INITIAL  3.  Patient will report being able to sleep at least 6 hours without being awakened by her familiar symptoms. Baseline:  Goal status: INITIAL  4.  Patient will improve her ODI score from 28% to 18% or less for improved function with her daily activities Baseline:  Goal status: INITIAL  LONG TERM GOALS: Target date: 07/25/22  Patient will be independent with her advanced HEP. Baseline:  Goal status: INITIAL  2.  Patient will be able to complete her daily activities without her familiar pain exceeding 6/10.  Baseline:  Goal status: INITIAL  3.  Patient will improve her ODI score to 8% or less for improved function with her daily activities. Baseline:  Goal status: INITIAL  4.  Patient will report being able to sleep without being awakened by her familiar pain Baseline:  Goal status: INITIAL  5.  Patient will be able to complete her critical job demands without being limited by her familiar symptoms. Baseline:  Goal status: INITIAL  PLAN:  PT FREQUENCY: 2x/week  PT DURATION: 6 weeks  PLANNED INTERVENTIONS: Therapeutic exercises, Therapeutic activity, Neuromuscular re-education, Balance training, Gait training, Patient/Family education, Self Care, Joint mobilization, Spinal mobilization, Cryotherapy, Moist heat, Manual therapy, and Re-evaluation.  PLAN FOR NEXT SESSION: NuStep, isometrics, manual therapy, and light lower extremity strengthening   Newman Pies, PTA 06/27/2022, 12:04 PM

## 2022-06-28 ENCOUNTER — Ambulatory Visit: Payer: Medicaid Other

## 2022-06-28 DIAGNOSIS — M5416 Radiculopathy, lumbar region: Secondary | ICD-10-CM

## 2022-06-28 DIAGNOSIS — M6281 Muscle weakness (generalized): Secondary | ICD-10-CM | POA: Diagnosis not present

## 2022-06-28 DIAGNOSIS — G8929 Other chronic pain: Secondary | ICD-10-CM | POA: Diagnosis not present

## 2022-06-28 DIAGNOSIS — M545 Low back pain, unspecified: Secondary | ICD-10-CM | POA: Diagnosis not present

## 2022-06-28 NOTE — Therapy (Signed)
OUTPATIENT PHYSICAL THERAPY THORACOLUMBAR TREATMENT   Patient Name: Kelly Fry MRN: [id number] DOB:1985-08-31, 37 y.o., female Today's Date: 06/28/2022  END OF SESSION:  PT End of Session - 06/28/22 0904     Visit Number 3    Number of Visits 12    Date for PT Re-Evaluation 09/06/22    PT Start Time 0900    PT Stop Time 0945    PT Time Calculation (min) 45 min    Activity Tolerance Patient tolerated treatment well    Behavior During Therapy [facility] for tasks assessed/performed             Past Medical History:  Diagnosis Date   Ankle instability, right    Anxiety    Back pain    Bipolar disorder (HCC)    Complication of anesthesia    Depression    Dyspnea    Family history of adverse reaction to anesthesia    Dad had n/v post anesthesia   GERD (gastroesophageal reflux disease)    Leg edema    Medical history non-contributory    Miscarriage    PONV (postoperative nausea and vomiting)    Vitamin D deficiency    Past Surgical History:  Procedure Laterality Date   ANKLE RECONSTRUCTION Right 02/21/2017   Procedure: RIGHT LATERAL ANKLE RECONSTRUCTION WITH SPLIT PERONEAL LONGUS TENDON;  Surgeon: Eldred Manges, MD;  Location: [facility] OR;  Service: Orthopedics;  Laterality: Right;   NO PAST SURGERIES     WISDOM TOOTH EXTRACTION  04/12/2015   Patient Active Problem List   Diagnosis Date Noted   History of Roux-en-Y gastric bypass 05/18/2020   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   Post-operative nausea and vomiting 05/08/2020   Hypertriglyceridemia 12/23/2019   Obstructive sleep apnea syndrome, mild 12/22/2019   Metabolic syndrome 12/13/2019   Bipolar 1 disorder (HCC) 12/07/2019   Depressed mood 10/11/2019   Seasonal allergies 05/19/2019   Gastroesophageal reflux disease 04/07/2019   Insulin resistance 11/19/2018   Depression with anxiety 11/19/2018   Class 3 severe obesity with serious comorbidity and body mass index (BMI) of 50.0 to 59.9 in adult (HCC)  11/19/2018   Chronic bilateral low back pain with left-sided sciatica 09/17/2018   Chronic midline thoracic back pain 09/17/2018   Ankle instability, 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   Right ankle instability 01/09/2017   Vitamin D deficiency 07/04/2015   GAD (generalized anxiety disorder) 06/30/2015   Depression 03/28/2015   Morbid obesity with BMI of 45.0-49.9, adult (HCC) 12/21/2014   Rh negative, antepartum 06/03/2013   REFERRING PROVIDER: Eldred Manges, MD   REFERRING DIAG: Chronic bilateral low back pain, unspecified whether sciatica present   Rationale for Evaluation and Treatment: Rehabilitation  THERAPY DIAG:  Radiculopathy, lumbar region  Muscle weakness (generalized)  ONSET DATE: 18 years ago   SUBJECTIVE:  SUBJECTIVE STATEMENT: Pt reports slight increase in soreness since yesterday's treatment session, but not too bad.   PERTINENT HISTORY:  Depression, anxiety, allergies, and bipolar disorder  PAIN:  Are you having pain? Yes: NPRS scale: 3-4/10 Pain location: low back, left hip, and bilateral hamstrings Pain description: aching, intermittent burning, and numbness Aggravating factors: sitting (30-60 minutes), standing (30-60 minutes)  Relieving factors: TENS unit (temporary), heat  PRECAUTIONS: None  WEIGHT BEARING RESTRICTIONS: No  FALLS:  Has patient fallen in last 6 months? No  LIVING ENVIRONMENT: Lives with: lives with their family Lives in: House/apartment Stairs: No Has following equipment at home: None  OCCUPATION: begins a new job on 06/17/22 at [employer] service  PLOF: Independent, but has increased difficulty  PATIENT GOALS: reduced pain, be able to sleep better (4-5 hours before being awakened due to pain), be able to sit and stand longer  NEXT MD VISIT: none  scheduled  OBJECTIVE:   DIAGNOSTIC FINDINGS: 05/28/22 lumbar x-ray IMPRESSION: Slight L5-S1 disc space narrowing. Otherwise negative radiographs of the lumbar spine.  PATIENT SURVEYS:  Modified Oswestry 28%   SCREENING FOR RED FLAGS: Bowel or bladder incontinence: No Spinal tumors: No Cauda equina syndrome: No Compression fracture: No Abdominal aneurysm: No  COGNITION: Overall cognitive status: Within functional limits for tasks assessed     SENSATION: Patient has began to notice rare numbness in both hips  POSTURE: increased lumbar lordosis  PALPATION: Familiar pain reproduced with palpation to: left lumbar paraspinals, gluteals, and piriformis  LUMBAR JOINT MOBILITY:  Hypomobile throughout with pain at L3-S1   LUMBAR ROM:   AROM eval  Flexion 80  Extension 20; midline low back pain  Right lateral flexion 22; pulling into left hip   Left lateral flexion 26  Right rotation 25% limited  Left rotation 50% limited; familiar low back pain   (Blank rows = not tested)  LOWER EXTREMITY ROM: WFL for activities assessed  LOWER EXTREMITY MMT:    MMT Right eval Left eval  Hip flexion 4/5 4/5  Hip extension    Hip abduction    Hip adduction    Hip internal rotation    Hip external rotation    Knee flexion 4-/5 4/5  Knee extension 4-/5 4/5  Ankle dorsiflexion 4/5 4/5  Ankle plantarflexion    Ankle inversion    Ankle eversion     (Blank rows = not tested)  LUMBAR SPECIAL TESTS:  Straight leg raise test: Negative and FABER test: Negative  GAIT: Assistive device utilized: None Level of assistance: Complete Independence Comments: Decreased gait speed and hip extension bilaterally  TODAY'S TREATMENT:                                                                                                                              DATE:  EXERCISE LOG  Exercise Repetitions and Resistance Comments  Nustep Lvl 3 x 15 mins   [employer] out    [employer] 2# x 25 reps bil   Seated Marches 2# x 25 reps bil   Ball Squeeze 2.5 mins   Clams Red 2.5 mins   Ham Curls Red x 25 reps bil    Blank cell = exercise not performed today   Manual Therapy Soft Tissue Mobilization: left lumbar spine, STW/M to left SI region, QL, and glutes to decrease pain and tone with pt in right side-lying for comfort with pillow between her knees    PATIENT EDUCATION:  Education details: Healing, prognosis, anatomy, plan of care, and goals for therapy Person educated: Patient Education method: Explanation Education comprehension: verbalized understanding  HOME EXERCISE PROGRAM:   ASSESSMENT:  CLINICAL IMPRESSION: Pt arrives for today's treatment session reporting 3-4/10 left low back and hip pain.  Pt reports slight increase in soreness since yesterday's treatment session.  Pt able to tolerate increased time or reps with all exercises today.  STW/M performed to left lumbar spine, QL, and glutes to decrease pain and tone.  Pt report decreased pain at completion of today's treatment session.  OBJECTIVE IMPAIRMENTS: Abnormal gait, decreased activity tolerance, decreased mobility, difficulty walking, decreased ROM, decreased strength, hypomobility, impaired sensation, impaired tone, postural dysfunction, and pain.   ACTIVITY LIMITATIONS: carrying, lifting, bending, sitting, standing, sleeping, locomotion level, and caring for others  PARTICIPATION LIMITATIONS: meal prep, cleaning, laundry, shopping, community activity, and yard work  PERSONAL FACTORS: Past/current experiences, Time since onset of injury/illness/exacerbation, and 3+ comorbidities: Depression, anxiety, allergies, and bipolar disorder  are also affecting patient's functional outcome.   REHAB POTENTIAL: Fair    CLINICAL DECISION MAKING: Evolving/moderate complexity  EVALUATION COMPLEXITY: Moderate   GOALS: Goals reviewed with patient? Yes  SHORT TERM GOALS: Target  date: 07/04/22  Patient will be independent with her initial HEP. Baseline: Goal status: INITIAL  2.  Patient will be able to complete her daily activities without her familiar pain exceeding 8/10. Baseline:  Goal status: INITIAL  3.  Patient will report being able to sleep at least 6 hours without being awakened by her familiar symptoms. Baseline:  Goal status: INITIAL  4.  Patient will improve her ODI score from 28% to 18% or less for improved function with her daily activities Baseline:  Goal status: INITIAL  LONG TERM GOALS: Target date: 07/25/22  Patient will be independent with her advanced HEP. Baseline:  Goal status: INITIAL  2.  Patient will be able to complete her daily activities without her familiar pain exceeding 6/10.  Baseline:  Goal status: INITIAL  3.  Patient will improve her ODI score to 8% or less for improved function with her daily activities. Baseline:  Goal status: INITIAL  4.  Patient will report being able to sleep without being awakened by her familiar pain Baseline:  Goal status: INITIAL  5.  Patient will be able to complete her critical job demands without being limited by her familiar symptoms. Baseline:  Goal status: INITIAL  PLAN:  PT FREQUENCY: 2x/week  PT DURATION: 6 weeks  PLANNED INTERVENTIONS: Therapeutic exercises, Therapeutic activity, Neuromuscular re-education, Balance training, Gait training, Patient/Family education, Self Care, Joint mobilization, Spinal mobilization, Cryotherapy, Moist heat, Manual therapy, and Re-evaluation.  PLAN FOR NEXT SESSION: NuStep, isometrics, manual therapy, and light lower extremity strengthening   Newman Pies, PTA 06/28/2022, 9:57 AM

## 2022-07-02 ENCOUNTER — Ambulatory Visit: Payer: Medicaid Other

## 2022-07-02 DIAGNOSIS — M545 Low back pain, unspecified: Secondary | ICD-10-CM | POA: Diagnosis not present

## 2022-07-02 DIAGNOSIS — M5416 Radiculopathy, lumbar region: Secondary | ICD-10-CM

## 2022-07-02 DIAGNOSIS — M6281 Muscle weakness (generalized): Secondary | ICD-10-CM | POA: Diagnosis not present

## 2022-07-02 DIAGNOSIS — G8929 Other chronic pain: Secondary | ICD-10-CM | POA: Diagnosis not present

## 2022-07-02 NOTE — Therapy (Signed)
OUTPATIENT PHYSICAL THERAPY THORACOLUMBAR TREATMENT   Patient Name: Kelly Fry MRN: [id number] DOB:29-Aug-1985, 37 y.o., female Today's Date: 07/02/2022  END OF SESSION:  PT End of Session - 07/02/22 1603     Visit Number 4    Number of Visits 12    Date for PT Re-Evaluation 09/06/22    PT Start Time 1600    PT Stop Time 1645    PT Time Calculation (min) 45 min    Activity Tolerance Patient tolerated treatment well    Behavior During Therapy WFL for tasks assessed/performed             Past Medical History:  Diagnosis Date   Ankle instability, right    Anxiety    Back pain    Bipolar disorder (HCC)    Complication of anesthesia    Depression    Dyspnea    Family history of adverse reaction to anesthesia    Dad had n/v post anesthesia   GERD (gastroesophageal reflux disease)    Leg edema    Medical history non-contributory    Miscarriage    PONV (postoperative nausea and vomiting)    Vitamin D deficiency    Past Surgical History:  Procedure Laterality Date   ANKLE RECONSTRUCTION Right 02/21/2017   Procedure: RIGHT LATERAL ANKLE RECONSTRUCTION WITH SPLIT PERONEAL LONGUS TENDON;  Surgeon: Eldred Manges, MD;  Location: [facility] OR;  Service: Orthopedics;  Laterality: Right;   NO PAST SURGERIES     WISDOM TOOTH EXTRACTION  04/12/2015   Patient Active Problem List   Diagnosis Date Noted   History of Roux-en-Y gastric bypass 05/18/2020   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   Post-operative nausea and vomiting 05/08/2020   Hypertriglyceridemia 12/23/2019   Obstructive sleep apnea syndrome, mild 12/22/2019   Metabolic syndrome 12/13/2019   Bipolar 1 disorder (HCC) 12/07/2019   Depressed mood 10/11/2019   Seasonal allergies 05/19/2019   Gastroesophageal reflux disease 04/07/2019   Insulin resistance 11/19/2018   Depression with anxiety 11/19/2018   Class 3 severe obesity with serious comorbidity and body mass index (BMI) of 50.0 to 59.9 in adult (HCC)  11/19/2018   Chronic bilateral low back pain with left-sided sciatica 09/17/2018   Chronic midline thoracic back pain 09/17/2018   Ankle instability, 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   Right ankle instability 01/09/2017   Vitamin D deficiency 07/04/2015   GAD (generalized anxiety disorder) 06/30/2015   Depression 03/28/2015   Morbid obesity with BMI of 45.0-49.9, adult (HCC) 12/21/2014   Rh negative, antepartum 06/03/2013   REFERRING PROVIDER: Eldred Manges, MD   REFERRING DIAG: Chronic bilateral low back pain, unspecified whether sciatica present   Rationale for Evaluation and Treatment: Rehabilitation  THERAPY DIAG:  Radiculopathy, lumbar region  Muscle weakness (generalized)  ONSET DATE: 18 years ago   SUBJECTIVE:  SUBJECTIVE STATEMENT: Pt reports 6.5-7/10 low back pain.  Pt reports increased soreness since beginning her new job recently.    PERTINENT HISTORY:  Depression, anxiety, allergies, and bipolar disorder  PAIN:  Are you having pain? Yes: NPRS scale: 6.5-7/10 Pain location: low back, left hip, and bilateral hamstrings Pain description: aching, intermittent burning, and numbness Aggravating factors: sitting (30-60 minutes), standing (30-60 minutes)  Relieving factors: TENS unit (temporary), heat  PRECAUTIONS: None  WEIGHT BEARING RESTRICTIONS: No  FALLS:  Has patient fallen in last 6 months? No  LIVING ENVIRONMENT: Lives with: lives with their family Lives in: House/apartment Stairs: No Has following equipment at home: None  OCCUPATION: begins a new job on 06/17/22 at [employer] service  PLOF: Independent, but has increased difficulty  PATIENT GOALS: reduced pain, be able to sleep better (4-5 hours before being awakened due to pain), be able to sit and stand longer  NEXT MD VISIT: none  scheduled  OBJECTIVE:   DIAGNOSTIC FINDINGS: 05/28/22 lumbar x-ray IMPRESSION: Slight L5-S1 disc space narrowing. Otherwise negative radiographs of the lumbar spine.  PATIENT SURVEYS:  Modified Oswestry 28%   SCREENING FOR RED FLAGS: Bowel or bladder incontinence: No Spinal tumors: No Cauda equina syndrome: No Compression fracture: No Abdominal aneurysm: No  COGNITION: Overall cognitive status: Within functional limits for tasks assessed     SENSATION: Patient has began to notice rare numbness in both hips  POSTURE: increased lumbar lordosis  PALPATION: Familiar pain reproduced with palpation to: left lumbar paraspinals, gluteals, and piriformis  LUMBAR JOINT MOBILITY:  Hypomobile throughout with pain at L3-S1   LUMBAR ROM:   AROM eval  Flexion 80  Extension 20; midline low back pain  Right lateral flexion 22; pulling into left hip   Left lateral flexion 26  Right rotation 25% limited  Left rotation 50% limited; familiar low back pain   (Blank rows = not tested)  LOWER EXTREMITY ROM: WFL for activities assessed  LOWER EXTREMITY MMT:    MMT Right eval Left eval  Hip flexion 4/5 4/5  Hip extension    Hip abduction    Hip adduction    Hip internal rotation    Hip external rotation    Knee flexion 4-/5 4/5  Knee extension 4-/5 4/5  Ankle dorsiflexion 4/5 4/5  Ankle plantarflexion    Ankle inversion    Ankle eversion     (Blank rows = not tested)  LUMBAR SPECIAL TESTS:  Straight leg raise test: Negative and FABER test: Negative  GAIT: Assistive device utilized: None Level of assistance: Complete Independence Comments: Decreased gait speed and hip extension bilaterally  TODAY'S TREATMENT:                                                                                                                              DATE:  EXERCISE LOG  Exercise Repetitions and Resistance Comments  Nustep Lvl 3 x 15 mins   [employer] out    [employer] 2# x 25 reps bil   Seated Marches 2# x 25 reps bil   Ball Squeeze 2.5 mins   Clams Red 2.5 mins   Ham Curls Red x 25 reps bil    Blank cell = exercise not performed today   Manual Therapy Soft Tissue Mobilization: left lumbar spine, STW/M to left SI region, QL, and glutes to decrease pain and tone with pt in right side-lying for comfort with pillow between her knees    PATIENT EDUCATION:  Education details: Healing, prognosis, anatomy, plan of care, and goals for therapy Person educated: Patient Education method: Explanation Education comprehension: verbalized understanding  HOME EXERCISE PROGRAM:   ASSESSMENT:  CLINICAL IMPRESSION: Pt arrives for today's treatment session reporting 6.5-7/10 low back pain.  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  Reviewed previously performed exercises today due to increased soreness and increased activity from beginning job.  STW/M performed to left low back, QL, and SI region to decrease pain and tone.  Pt reported 3/10 low back and SI joint pain at completion of today's treatment session.   OBJECTIVE IMPAIRMENTS: Abnormal gait, decreased activity tolerance, decreased mobility, difficulty walking, decreased ROM, decreased strength, hypomobility, impaired sensation, impaired tone, postural dysfunction, and pain.   ACTIVITY LIMITATIONS: carrying, lifting, bending, sitting, standing, sleeping, locomotion level, and caring for others  PARTICIPATION LIMITATIONS: meal prep, cleaning, laundry, shopping, community activity, and yard work  PERSONAL FACTORS: Past/current experiences, Time since onset of injury/illness/exacerbation, and 3+ comorbidities: Depression, anxiety, allergies, and bipolar disorder  are also affecting patient's functional outcome.   REHAB POTENTIAL: Fair    CLINICAL DECISION MAKING: Evolving/moderate complexity  EVALUATION COMPLEXITY:  Moderate   GOALS: Goals reviewed with patient? Yes  SHORT TERM GOALS: Target date: 07/04/22  Patient will be independent with her initial HEP. Baseline: Goal status: INITIAL  2.  Patient will be able to complete her daily activities without her familiar pain exceeding 8/10. Baseline:  Goal status: INITIAL  3.  Patient will report being able to sleep at least 6 hours without being awakened by her familiar symptoms. Baseline:  Goal status: INITIAL  4.  Patient will improve her ODI score from 28% to 18% or less for improved function with her daily activities Baseline:  Goal status: INITIAL  LONG TERM GOALS: Target date: 07/25/22  Patient will be independent with her advanced HEP. Baseline:  Goal status: INITIAL  2.  Patient will be able to complete her daily activities without her familiar pain exceeding 6/10.  Baseline:  Goal status: INITIAL  3.  Patient will improve her ODI score to 8% or less for improved function with her daily activities. Baseline:  Goal status: INITIAL  4.  Patient will report being able to sleep without being awakened by her familiar pain Baseline:  Goal status: INITIAL  5.  Patient will be able to complete her critical job demands without being limited by her familiar symptoms. Baseline:  Goal status: INITIAL  PLAN:  PT FREQUENCY: 2x/week  PT DURATION: 6 weeks  PLANNED INTERVENTIONS: Therapeutic exercises, Therapeutic activity, Neuromuscular re-education, Balance training, Gait training, Patient/Family education, Self Care, Joint mobilization, Spinal mobilization, Cryotherapy, Moist heat, Manual therapy, and Re-evaluation.  PLAN FOR NEXT SESSION: NuStep, isometrics, manual therapy, and light lower extremity strengthening  Newman Pies, PTA 07/02/2022, 4:55 PM

## 2022-07-04 ENCOUNTER — Encounter: Payer: Self-pay | Admitting: Physical Therapy

## 2022-07-04 ENCOUNTER — Ambulatory Visit: Payer: Medicaid Other | Admitting: Physical Therapy

## 2022-07-04 DIAGNOSIS — G8929 Other chronic pain: Secondary | ICD-10-CM | POA: Diagnosis not present

## 2022-07-04 DIAGNOSIS — M6281 Muscle weakness (generalized): Secondary | ICD-10-CM

## 2022-07-04 DIAGNOSIS — M5416 Radiculopathy, lumbar region: Secondary | ICD-10-CM

## 2022-07-04 DIAGNOSIS — M545 Low back pain, unspecified: Secondary | ICD-10-CM | POA: Diagnosis not present

## 2022-07-04 NOTE — Therapy (Signed)
OUTPATIENT PHYSICAL THERAPY THORACOLUMBAR TREATMENT   Patient Name: Kelly Fry MRN: [id number] DOB:09-08-1985, 37 y.o., female Today's Date: 07/04/2022  END OF SESSION:  PT End of Session - 07/04/22 1600     Visit Number 5    Number of Visits 12    Date for PT Re-Evaluation 09/06/22    PT Start Time 1601    PT Stop Time 1640    PT Time Calculation (min) 39 min    Activity Tolerance Patient tolerated treatment well    Behavior During Therapy WFL for tasks assessed/performed            Past Medical History:  Diagnosis Date   Ankle instability, right    Anxiety    Back pain    Bipolar disorder (HCC)    Complication of anesthesia    Depression    Dyspnea    Family history of adverse reaction to anesthesia    Dad had n/v post anesthesia   GERD (gastroesophageal reflux disease)    Leg edema    Medical history non-contributory    Miscarriage    PONV (postoperative nausea and vomiting)    Vitamin D deficiency    Past Surgical History:  Procedure Laterality Date   ANKLE RECONSTRUCTION Right 02/21/2017   Procedure: RIGHT LATERAL ANKLE RECONSTRUCTION WITH SPLIT PERONEAL LONGUS TENDON;  Surgeon: Eldred Manges, MD;  Location: [facility] OR;  Service: Orthopedics;  Laterality: Right;   NO PAST SURGERIES     WISDOM TOOTH EXTRACTION  04/12/2015   Patient Active Problem List   Diagnosis Date Noted   History of Roux-en-Y gastric bypass 05/18/2020   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   Post-operative nausea and vomiting 05/08/2020   Hypertriglyceridemia 12/23/2019   Obstructive sleep apnea syndrome, mild 12/22/2019   Metabolic syndrome 12/13/2019   Bipolar 1 disorder (HCC) 12/07/2019   Depressed mood 10/11/2019   Seasonal allergies 05/19/2019   Gastroesophageal reflux disease 04/07/2019   Insulin resistance 11/19/2018   Depression with anxiety 11/19/2018   Class 3 severe obesity with serious comorbidity and body mass index (BMI) of 50.0 to 59.9 in adult (HCC) 11/19/2018    Chronic bilateral low back pain with left-sided sciatica 09/17/2018   Chronic midline thoracic back pain 09/17/2018   Ankle instability, 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   Right ankle instability 01/09/2017   Vitamin D deficiency 07/04/2015   GAD (generalized anxiety disorder) 06/30/2015   Depression 03/28/2015   Morbid obesity with BMI of 45.0-49.9, adult (HCC) 12/21/2014   Rh negative, antepartum 06/03/2013   REFERRING PROVIDER: Eldred Manges, MD   REFERRING DIAG: Chronic bilateral low back pain, unspecified whether sciatica present   Rationale for Evaluation and Treatment: Rehabilitation  THERAPY DIAG:  Radiculopathy, lumbar region  Muscle weakness (generalized)  ONSET DATE: 18 years ago   SUBJECTIVE:  SUBJECTIVE STATEMENT: Reports that her back feels okay today but has knee soreness. No considerable symptoms. Reports that when she got out of work yesterday she rested all evening.  PERTINENT HISTORY:  Depression, anxiety, allergies, and bipolar disorder  PAIN:  Are you having pain? Yes: NPRS scale: 1/10 Pain location: low back, left hip, and bilateral hamstrings Pain description: sore Aggravating factors: sitting (30-60 minutes), standing (30-60 minutes)  Relieving factors: TENS unit (temporary), heat  PRECAUTIONS: None  OCCUPATION: begins a new job on 06/17/22 at [employer] service  PLOF: Independent, but has increased difficulty  PATIENT GOALS: reduced pain, be able to sleep better (4-5 hours before being awakened due to pain), be able to sit and stand longer  NEXT MD VISIT: none scheduled  OBJECTIVE:   DIAGNOSTIC FINDINGS: 05/28/22 lumbar x-ray IMPRESSION: Slight L5-S1 disc space narrowing. Otherwise negative radiographs of the lumbar spine.  PATIENT SURVEYS:  Modified Oswestry 28%     SENSATION: Patient denies numbness in B hips as of 07/04/22  POSTURE: increased lumbar lordosis  PALPATION: Familiar pain reproduced with palpation to: left lumbar paraspinals, gluteals, and piriformis  LUMBAR JOINT MOBILITY:  Hypomobile throughout with pain at L3-S1   LUMBAR ROM:   AROM eval  Flexion 80  Extension 20; midline low back pain  Right lateral flexion 22; pulling into left hip   Left lateral flexion 26  Right rotation 25% limited  Left rotation 50% limited; familiar low back pain   (Blank rows = not tested)  LOWER EXTREMITY ROM: WFL for activities assessed  LOWER EXTREMITY MMT:    MMT Right eval Left eval  Hip flexion 4/5 4/5  Hip extension    Hip abduction    Hip adduction    Hip internal rotation    Hip external rotation    Knee flexion 4-/5 4/5  Knee extension 4-/5 4/5  Ankle dorsiflexion 4/5 4/5  Ankle plantarflexion    Ankle inversion    Ankle eversion     (Blank rows = not tested)  LUMBAR SPECIAL TESTS:  Straight leg raise test: Negative and FABER test: Negative  GAIT: Assistive device utilized: None Level of assistance: Complete Independence Comments: Decreased gait speed and hip extension bilaterally  TODAY'S TREATMENT:                                                                                                                              DATE:    07/04/22  EXERCISE LOG  Exercise Repetitions and Resistance Comments  Nustep Lvl 4 x 15 mins   LAQs 2# x 30 reps   Seated Marches 2# x 30 reps   Ball Squeeze 2.5 mins   Clams Green theraband 30 reps   Ham Curls Green theraband x 20 reps    Blank cell = exercise not performed today   PATIENT EDUCATION:  Education details: Scientific laboratory technician Person educated: Patient Education method: Explanation, handout Education comprehension: verbalized understanding  HOME EXERCISE PROGRAM:  ASSESSMENT:  CLINICAL IMPRESSION: Patient presented in clinic with reports of minimal LBP and  knee soreness. Patient progressed with LE strengthening in which she reported popping sounds occasionally. Patient admits to not being as active outside of work and has recently talked of returning to the gym for overall health. Patient had no complaints of any increased pain during treatment and opted for no manual therapy today. Patient provided HEP for posture and ADLs with education and demo for techniques. Patient verbalized understanding of instruction.  OBJECTIVE IMPAIRMENTS: Abnormal gait, decreased activity tolerance, decreased mobility, difficulty walking, decreased ROM, decreased strength, hypomobility, impaired sensation, impaired tone, postural dysfunction, and pain.   ACTIVITY LIMITATIONS: carrying, lifting, bending, sitting, standing, sleeping, locomotion level, and caring for others  PARTICIPATION LIMITATIONS: meal prep, cleaning, laundry, shopping, community activity, and yard work  PERSONAL FACTORS: Past/current experiences, Time since onset of injury/illness/exacerbation, and 3+ comorbidities: Depression, anxiety, allergies, and bipolar disorder  are also affecting patient's functional outcome.   REHAB POTENTIAL: Fair    CLINICAL DECISION MAKING: Evolving/moderate complexity  EVALUATION COMPLEXITY: Moderate  GOALS: Goals reviewed with patient? Yes  SHORT TERM GOALS: Target date: 07/04/22  Patient will be independent with her initial HEP. Baseline: Goal status: INITIAL  2.  Patient will be able to complete her daily activities without her familiar pain exceeding 8/10. Baseline:  Goal status: INITIAL  3.  Patient will report being able to sleep at least 6 hours without being awakened by her familiar symptoms. Baseline:  Goal status: INITIAL  4.  Patient will improve her ODI score from 28% to 18% or less for improved function with her daily activities Baseline:  Goal status: INITIAL  LONG TERM GOALS: Target date: 07/25/22  Patient will be independent with her  advanced HEP. Baseline:  Goal status: INITIAL  2.  Patient will be able to complete her daily activities without her familiar pain exceeding 6/10.  Baseline:  Goal status: INITIAL  3.  Patient will improve her ODI score to 8% or less for improved function with her daily activities. Baseline:  Goal status: INITIAL  4.  Patient will report being able to sleep without being awakened by her familiar pain Baseline:  Goal status: INITIAL  5.  Patient will be able to complete her critical job demands without being limited by her familiar symptoms. Baseline:  Goal status: INITIAL  PLAN:  PT FREQUENCY: 2x/week  PT DURATION: 6 weeks  PLANNED INTERVENTIONS: Therapeutic exercises, Therapeutic activity, Neuromuscular re-education, Balance training, Gait training, Patient/Family education, Self Care, Joint mobilization, Spinal mobilization, Cryotherapy, Moist heat, Manual therapy, and Re-evaluation.  PLAN FOR NEXT SESSION: NuStep, isometrics, manual therapy, and light lower extremity strengthening  Marvell Fuller, PTA 07/04/2022, 4:58 PM

## 2022-07-04 NOTE — Patient Instructions (Signed)

## 2022-07-09 ENCOUNTER — Encounter: Payer: Self-pay | Admitting: Physical Therapy

## 2022-07-09 ENCOUNTER — Ambulatory Visit: Payer: Medicaid Other | Admitting: Physical Therapy

## 2022-07-09 DIAGNOSIS — M5416 Radiculopathy, lumbar region: Secondary | ICD-10-CM

## 2022-07-09 DIAGNOSIS — M6281 Muscle weakness (generalized): Secondary | ICD-10-CM | POA: Diagnosis not present

## 2022-07-09 DIAGNOSIS — G8929 Other chronic pain: Secondary | ICD-10-CM | POA: Diagnosis not present

## 2022-07-09 DIAGNOSIS — M545 Low back pain, unspecified: Secondary | ICD-10-CM | POA: Diagnosis not present

## 2022-07-09 NOTE — Therapy (Signed)
OUTPATIENT PHYSICAL THERAPY THORACOLUMBAR TREATMENT   Patient Name: Kelly Fry MRN: [id number] DOB:04/02/1985, 37 y.o., female Today's Date: 07/09/2022  END OF SESSION:  PT End of Session - 07/09/22 1605     Visit Number 6    Number of Visits 12    Date for PT Re-Evaluation 09/06/22    PT Start Time 1603    PT Stop Time 1645    PT Time Calculation (min) 42 min    Activity Tolerance Patient tolerated treatment well    Behavior During Therapy WFL for tasks assessed/performed            Past Medical History:  Diagnosis Date   Ankle instability, right    Anxiety    Back pain    Bipolar disorder (HCC)    Complication of anesthesia    Depression    Dyspnea    Family history of adverse reaction to anesthesia    Dad had n/v post anesthesia   GERD (gastroesophageal reflux disease)    Leg edema    Medical history non-contributory    Miscarriage    PONV (postoperative nausea and vomiting)    Vitamin D deficiency    Past Surgical History:  Procedure Laterality Date   ANKLE RECONSTRUCTION Right 02/21/2017   Procedure: RIGHT LATERAL ANKLE RECONSTRUCTION WITH SPLIT PERONEAL LONGUS TENDON;  Surgeon: Eldred Manges, MD;  Location: [facility] OR;  Service: Orthopedics;  Laterality: Right;   NO PAST SURGERIES     WISDOM TOOTH EXTRACTION  04/12/2015   Patient Active Problem List   Diagnosis Date Noted   History of Roux-en-Y gastric bypass 05/18/2020   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   Post-operative nausea and vomiting 05/08/2020   Hypertriglyceridemia 12/23/2019   Obstructive sleep apnea syndrome, mild 12/22/2019   Metabolic syndrome 12/13/2019   Bipolar 1 disorder (HCC) 12/07/2019   Depressed mood 10/11/2019   Seasonal allergies 05/19/2019   Gastroesophageal reflux disease 04/07/2019   Insulin resistance 11/19/2018   Depression with anxiety 11/19/2018   Class 3 severe obesity with serious comorbidity and body mass index (BMI) of 50.0 to 59.9 in adult (HCC) 11/19/2018    Chronic bilateral low back pain with left-sided sciatica 09/17/2018   Chronic midline thoracic back pain 09/17/2018   Ankle instability, 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   Right ankle instability 01/09/2017   Vitamin D deficiency 07/04/2015   GAD (generalized anxiety disorder) 06/30/2015   Depression 03/28/2015   Morbid obesity with BMI of 45.0-49.9, adult (HCC) 12/21/2014   Rh negative, antepartum 06/03/2013   REFERRING PROVIDER: Eldred Manges, MD   REFERRING DIAG: Chronic bilateral low back pain, unspecified whether sciatica present   Rationale for Evaluation and Treatment: Rehabilitation  THERAPY DIAG:  Radiculopathy, lumbar region  Muscle weakness (generalized)  ONSET DATE: 18 years ago   SUBJECTIVE:  SUBJECTIVE STATEMENT: Reports that she still feels really good. Has bought a theraball and has sat on it instead of chairs.  PERTINENT HISTORY:  Depression, anxiety, allergies, and bipolar disorder  PAIN:  Are you having pain? No score provided   PRECAUTIONS: None  OCCUPATION: begins a new job on 06/17/22 at [employer] service  PLOF: Independent, but has increased difficulty  PATIENT GOALS: reduced pain, be able to sleep better (4-5 hours before being awakened due to pain), be able to sit and stand longer  NEXT MD VISIT: none scheduled  OBJECTIVE:   DIAGNOSTIC FINDINGS: 05/28/22 lumbar x-ray IMPRESSION: Slight L5-S1 disc space narrowing. Otherwise negative radiographs of the lumbar spine.  PATIENT SURVEYS:  Modified Oswestry 28%    SENSATION: Patient denies numbness in B hips as of 07/04/22  POSTURE: increased lumbar lordosis  PALPATION: Familiar pain reproduced with palpation to: left lumbar paraspinals, gluteals, and piriformis  LUMBAR JOINT MOBILITY:  Hypomobile throughout with pain at L3-S1    LUMBAR ROM:   AROM eval  Flexion 80  Extension 20; midline low back pain  Right lateral flexion 22; pulling into left hip   Left lateral flexion 26  Right rotation 25% limited  Left rotation 50% limited; familiar low back pain   (Blank rows = not tested)  LOWER EXTREMITY ROM: WFL for activities assessed  LOWER EXTREMITY MMT:    MMT Right eval Left eval  Hip flexion 4/5 4/5  Hip extension    Hip abduction    Hip adduction    Hip internal rotation    Hip external rotation    Knee flexion 4-/5 4/5  Knee extension 4-/5 4/5  Ankle dorsiflexion 4/5 4/5  Ankle plantarflexion    Ankle inversion    Ankle eversion     (Blank rows = not tested)  LUMBAR SPECIAL TESTS:  Straight leg raise test: Negative and FABER test: Negative  GAIT: Assistive device utilized: None Level of assistance: Complete Independence Comments: WNL  TODAY'S TREATMENT:                                                                                                                              DATE:    07/09/22  EXERCISE LOG  Exercise Repetitions and Resistance Comments  Nustep Lvl 4 x 15 mins   Standing core press X20 reps 5 sec holds   Standing oblique press X20 reps 5 sec holds   Standing press with marching X20 reps   Seated LAQ on ball  X20 reps Min UE support initially  Seated march on ball X20 reps  Min UE support intermittently  LAQ X20 reps 4#   Shoulder extension Blue XTS x15 reps   B D2 with core Blue XTS x15 reps each    Blank cell = exercise not performed today   PATIENT EDUCATION:  Education details: Core activation techniques with ADLs; Verbal instruction of theraball exercises  Person educated: Patient Education method: Explanation, handout Education comprehension:  verbalized understanding  HOME EXERCISE PROGRAM:  ASSESSMENT:  CLINICAL IMPRESSION: Patient presented in clinic with no complaints of pain. Patient states that she has recently purchased a new pair of shoes for  work as well as a theraball which she sits on instead of her couches. Patient was progressed to further core training with standing and sitting with theraball. Patient verbalized understanding of instruction. Walking program was also discussed.   OBJECTIVE IMPAIRMENTS: Abnormal gait, decreased activity tolerance, decreased mobility, difficulty walking, decreased ROM, decreased strength, hypomobility, impaired sensation, impaired tone, postural dysfunction, and pain.   ACTIVITY LIMITATIONS: carrying, lifting, bending, sitting, standing, sleeping, locomotion level, and caring for others  PARTICIPATION LIMITATIONS: meal prep, cleaning, laundry, shopping, community activity, and yard work  PERSONAL FACTORS: Past/current experiences, Time since onset of injury/illness/exacerbation, and 3+ comorbidities: Depression, anxiety, allergies, and bipolar disorder  are also affecting patient's functional outcome.   REHAB POTENTIAL: Fair    CLINICAL DECISION MAKING: Evolving/moderate complexity  EVALUATION COMPLEXITY: Moderate  GOALS: Goals reviewed with patient? Yes  SHORT TERM GOALS: Target date: 07/04/22  Patient will be independent with her initial HEP. Baseline: Goal status: INITIAL  2.  Patient will be able to complete her daily activities without her familiar pain exceeding 8/10. Baseline:  Goal status: INITIAL  3.  Patient will report being able to sleep at least 6 hours without being awakened by her familiar symptoms. Baseline:  Goal status: INITIAL  4.  Patient will improve her ODI score from 28% to 18% or less for improved function with her daily activities Baseline:  Goal status: INITIAL  LONG TERM GOALS: Target date: 07/25/22  Patient will be independent with her advanced HEP. Baseline:  Goal status: INITIAL  2.  Patient will be able to complete her daily activities without her familiar pain exceeding 6/10.  Baseline:  Goal status: INITIAL  3.  Patient will improve her ODI  score to 8% or less for improved function with her daily activities. Baseline:  Goal status: INITIAL  4.  Patient will report being able to sleep without being awakened by her familiar pain Baseline:  Goal status: INITIAL  5.  Patient will be able to complete her critical job demands without being limited by her familiar symptoms. Baseline:  Goal status: INITIAL  PLAN:  PT FREQUENCY: 2x/week  PT DURATION: 6 weeks  PLANNED INTERVENTIONS: Therapeutic exercises, Therapeutic activity, Neuromuscular re-education, Balance training, Gait training, Patient/Family education, Self Care, Joint mobilization, Spinal mobilization, Cryotherapy, Moist heat, Manual therapy, and Re-evaluation.  PLAN FOR NEXT SESSION: NuStep, isometrics, manual therapy, and light lower extremity strengthening  Marvell Fuller, PTA 07/09/2022, 6:12 PM

## 2022-07-30 DIAGNOSIS — Z6830 Body mass index (BMI) 30.0-30.9, adult: Secondary | ICD-10-CM | POA: Diagnosis not present

## 2022-07-30 DIAGNOSIS — Z9884 Bariatric surgery status: Secondary | ICD-10-CM | POA: Diagnosis not present

## 2022-07-30 DIAGNOSIS — E669 Obesity, unspecified: Secondary | ICD-10-CM | POA: Diagnosis not present

## 2022-07-31 ENCOUNTER — Telehealth: Payer: Self-pay | Admitting: Radiology

## 2022-07-31 DIAGNOSIS — G8929 Other chronic pain: Secondary | ICD-10-CM

## 2022-07-31 NOTE — Telephone Encounter (Signed)
I called patient and advised, scan order has been entered and [place] office will work to obtain authorization. Patient to let us know once scheduled so that we can set up her follow up appointment.

## 2022-07-31 NOTE — Telephone Encounter (Signed)
Patient states that she has completed PT x 6 weeks and continues to have back pain. She would like to proceed with MRI and for it to be done in [place]. Please advise. OK to order?  Per your last office note, she was to follow up in 8 weeks and you would plan for  diagnostic imaging if continued problems.  CB [phone]

## 2022-08-01 ENCOUNTER — Encounter: Payer: Self-pay | Admitting: Orthopaedic Surgery

## 2022-08-04 ENCOUNTER — Other Ambulatory Visit: Payer: Medicaid Other

## 2022-09-25 ENCOUNTER — Encounter: Payer: Self-pay | Admitting: Plastic Surgery

## 2022-09-25 ENCOUNTER — Ambulatory Visit: Payer: Medicaid Other | Admitting: Plastic Surgery

## 2022-09-25 VITALS — BP 134/82 | HR 67 | Ht 64.5 in | Wt 181.2 lb

## 2022-09-25 DIAGNOSIS — Z9884 Bariatric surgery status: Secondary | ICD-10-CM | POA: Diagnosis not present

## 2022-09-25 DIAGNOSIS — R21 Rash and other nonspecific skin eruption: Secondary | ICD-10-CM | POA: Diagnosis not present

## 2022-09-25 DIAGNOSIS — Z6830 Body mass index (BMI) 30.0-30.9, adult: Secondary | ICD-10-CM | POA: Diagnosis not present

## 2022-09-25 DIAGNOSIS — M793 Panniculitis, unspecified: Secondary | ICD-10-CM | POA: Diagnosis not present

## 2022-09-25 NOTE — Progress Notes (Signed)
Referring Provider Matilde Haymaker, FNP [address] [facility] [address] [place],  [place] [address]   CC:  Chief Complaint  Patient presents with   Consult           Kelly Fry is an 37 y.o. female.  HPI: Kelly Fry is a 37 year old female who presents today with complaints of excess skin and fat on the anterior abdominal wall resulting in rashes in the intertriginous regions.  She is requesting a panniculectomy.  She underwent Roux-en-Y gastric bypass in February 2022 and is lost approximately 100 pounds since that time.  She has been seen by her primary care provider because of rashes in her abdominal skin folds.  She has been prescribed medications for these rashes.  Allergies  Allergen Reactions   Latuda [Lurasidone] Other (See Comments)    Shaky; worsened mood    Outpatient Encounter Medications as of 09/25/2022  Medication Sig   albuterol (VENTOLIN HFA) 108 (90 Base) MCG/ACT inhaler Inhale 2 puffs into the lungs every 6 (six) hours as needed for wheezing or shortness of breath.   clotrimazole-betamethasone (LOTRISONE) cream Apply 1 Application topically daily.   diclofenac (VOLTAREN) 75 MG EC tablet Take 75 mg by mouth 2 (two) times daily.   naproxen (NAPROSYN) 500 MG tablet Take 1 tablet (500 mg total) by mouth 2 (two) times daily with a meal.   No facility-administered encounter medications on file as of 09/25/2022.     Past Medical History:  Diagnosis Date   Ankle instability, right    Anxiety    Back pain    Bipolar disorder (HCC)    Complication of anesthesia    Depression    Dyspnea    Family history of adverse reaction to anesthesia    Dad had n/v post anesthesia   GERD (gastroesophageal reflux disease)    Leg edema    Medical history non-contributory    Miscarriage    PONV (postoperative nausea and vomiting)    Vitamin D deficiency     Past Surgical History:  Procedure Laterality Date   ANKLE RECONSTRUCTION Right 02/21/2017    Procedure: RIGHT LATERAL ANKLE RECONSTRUCTION WITH SPLIT PERONEAL LONGUS TENDON;  Surgeon: Eldred Manges, MD;  Location: [facility] OR;  Service: Orthopedics;  Laterality: Right;   NO PAST SURGERIES     WISDOM TOOTH EXTRACTION  04/12/2015    Family History  Problem Relation Age of Onset   Kidney disease Father    COPD Father    Hypertension Father    Depression Father    Sleep apnea Father    Obesity Father    Kidney disease Sister    Asthma Sister    Early death Mother    Stroke Mother    Heart disease Mother    Hypertension Mother    Alcoholism Mother    Asthma Brother     Social History   Social History Narrative   Not on file     Review of Systems General: Denies fevers, chills, weight loss CV: Denies chest pain, shortness of breath, palpitations Abdomen: Excess skin and fat on the anterior abdominal wall especially below the umbilicus which she states interferes with her daily activities and causes rashes especially in the intertriginous regions  Physical Exam    09/25/2022   11:26 AM 06/05/2022    9:21 AM 05/24/2022   11:59 AM  Vitals with BMI  Height 5' 4.5" 5\' 4"  5\' 4"   Weight 181 lbs 3 oz 177 lbs 177 lbs  BMI 30.63 30.37 30.37  Systolic 134  119  Diastolic 82  82  Pulse 67  73    General:  No acute distress,  Alert and oriented, Non-Toxic, Normal speech and affect Abdomen: Patient has a moderately large pannus which hangs to the level of the symphysis pubis.  She has no palpable hernias and no scars from C-sections Mammogram: Not applicable due to age Assessment/Plan Panniculitis: Patient has been seen in the past for intertriginous rashes and rashes under skin folds.  She would likely benefit from a panniculectomy.  She and I discussed the procedure at length including the location of the incisions and the fact that this will not do anything for the skin and fat above her umbilicus.  She understands that she will have 2 drains postoperatively and these may stay in  up to 4 weeks.  She will also have compression which she will need to wear for 6 weeks postoperatively.  Her postoperative restrictions will be no heavy lifting greater than 20 pounds no vigorous activity and no submerging the incisions in water.  She understands it will be very important for her to get up and begin ambulating immediately after surgery to prevent DVTs.  All questions were answered to her satisfaction.  Photographs were obtained today with her consent.  Will submit her for a panniculectomy at her request.  Santiago Glad 09/25/2022, 3:51 PM

## 2022-10-08 ENCOUNTER — Ambulatory Visit: Payer: Medicaid Other | Admitting: Family

## 2022-10-23 ENCOUNTER — Telehealth: Payer: Self-pay | Admitting: Plastic Surgery

## 2022-10-23 NOTE — Telephone Encounter (Signed)
Patient called and wanted to know if you have received the signed insurance appeal request she sent.  If you did not receive it, patient said that she can come to the office and sign it.  Please call her at [phone]

## 2022-10-29 ENCOUNTER — Encounter: Payer: Self-pay | Admitting: Plastic Surgery

## 2023-05-02 DIAGNOSIS — F319 Bipolar disorder, unspecified: Secondary | ICD-10-CM | POA: Diagnosis not present

## 2023-05-04 DIAGNOSIS — F319 Bipolar disorder, unspecified: Secondary | ICD-10-CM | POA: Diagnosis not present

## 2023-05-21 DIAGNOSIS — F319 Bipolar disorder, unspecified: Secondary | ICD-10-CM | POA: Diagnosis not present

## 2023-05-31 DIAGNOSIS — F319 Bipolar disorder, unspecified: Secondary | ICD-10-CM | POA: Diagnosis not present

## 2023-06-09 DIAGNOSIS — F319 Bipolar disorder, unspecified: Secondary | ICD-10-CM | POA: Diagnosis not present

## 2023-06-11 ENCOUNTER — Telehealth: Payer: Self-pay | Admitting: Family

## 2023-06-11 NOTE — Telephone Encounter (Unsigned)
 Copied from CRM [id number]. Topic: Appointments - Scheduling Inquiry for Clinic >> Jun 11, 2023 11:36 AM Baldomero Bone wrote: Reason for CRM: Patient needs to have nexplanon  changed by 07/06/2023. Callback number is [phone]

## 2023-06-11 NOTE — Telephone Encounter (Signed)
 APPT MADE FYI FOR 30 MINS AND WILL MAKE SURE A NEXPLONON IS HERE

## 2023-06-11 NOTE — Telephone Encounter (Signed)
 Can nurse call back to schedule per office policy?

## 2023-06-13 ENCOUNTER — Encounter: Payer: Self-pay | Admitting: Radiology

## 2023-06-23 ENCOUNTER — Encounter: Payer: Self-pay | Admitting: Family Medicine

## 2023-06-23 ENCOUNTER — Ambulatory Visit: Admitting: Family Medicine

## 2023-06-23 VITALS — BP 111/69 | HR 87 | Temp 97.5°F | Ht 64.0 in | Wt 190.0 lb

## 2023-06-23 DIAGNOSIS — Z30433 Encounter for removal and reinsertion of intrauterine contraceptive device: Secondary | ICD-10-CM

## 2023-06-23 LAB — PREGNANCY, URINE: Preg Test, Ur: NEGATIVE

## 2023-06-23 NOTE — Progress Notes (Signed)
 BP 111/69   Pulse 87   Temp (!) 97.5 F (36.4 C)   Ht 5\' 4"  (1.626 m)   Wt 190 lb (86.2 kg)   SpO2 100%   BMI 32.61 kg/m    Subjective:   Patient ID: Kelly Fry, female    DOB: 03-12-1985, 38 y.o.   MRN: [id number]  HPI: Kelly Fry is a 38 y.o. female presenting on 06/23/2023 for Contraception (Patient would like to know if there is a higher chance of getting pregnant since she is close to the run out date.  )   HPI Nexplanon  removal and replacement Patient is coming in today for Nexplanon  removal and replacement.  This is her second Nexplanon  and going to be Nexplanon  and she has been pretty satisfied with them.  This past next month she has had normal cycles throughout but nothing excessive.  Relevant past medical, surgical, family and social history reviewed and updated as indicated. Interim medical history since our last visit reviewed. Allergies and medications reviewed and updated.  Review of Systems  Genitourinary:  Negative for dysuria and menstrual problem.    Per HPI unless specifically indicated above   Allergies as of 06/23/2023       Reactions   Latuda  [lurasidone ] Other (See Comments)   Shaky; worsened mood        Medication List        Accurate as of Jun 23, 2023  4:49 PM. If you have any questions, ask your nurse or doctor.          albuterol  108 (90 Base) MCG/ACT inhaler Commonly known as: VENTOLIN  HFA Inhale 2 puffs into the lungs every 6 (six) hours as needed for wheezing or shortness of breath.   clotrimazole -betamethasone  cream Commonly known as: LOTRISONE  Apply 1 Application topically daily.   diclofenac  75 MG EC tablet Commonly known as: VOLTAREN  Take 75 mg by mouth 2 (two) times daily.   naproxen  500 MG tablet Commonly known as: Naprosyn  Take 1 tablet (500 mg total) by mouth 2 (two) times daily with a meal.         Objective:   BP 111/69   Pulse 87   Temp (!) 97.5 F (36.4 C)   Ht 5\' 4"  (1.626 m)   Wt  190 lb (86.2 kg)   SpO2 100%   BMI 32.61 kg/m   Wt Readings from Last 3 Encounters:  06/23/23 190 lb (86.2 kg)  09/25/22 181 lb 3.2 oz (82.2 kg)  06/05/22 177 lb (80.3 kg)    Physical Exam Nursing note reviewed.  Constitutional:      Appearance: Normal appearance.  Skin:    General: Skin is warm and dry.  Neurological:     Mental Status: She is alert.     Nexplanon  removal: Nexplanon  is palpable on her left upper inner arm. Site was prepped with Betadine. 2.57mL of 2% lidocaine  without epinephrine were used for anesthesia. 15 blade was used to dissect down to 1 and of the device. Forceps were used to grab the end of the device and extracted which came out intact and whole.  Nexplanon  insertion: Patient educated on Nexplanon  birth control and its side effects and the side effects from the procedure. Patient wishes to continue. Device was prepped using sterile procedure. Nexplanon  was inserted using package and Company directions back into the same opening.  Bleeding was minimal and patient tolerated procedure well. Steri-Strips were placed to close the small incision. Pressure dressing was placed over  the site to prevent bruising. Patient tolerated procedure well and bleeding was minimal.   Assessment & Plan:   Problem List Items Addressed This Visit   None Visit Diagnoses       Encounter for removal and reinsertion of intrauterine contraceptive device (IUD)    -  Primary   Relevant Orders   POCT urine pregnancy   Pregnancy, urine (Completed)        Follow up plan: Return if symptoms worsen or fail to improve.  Counseling provided for all of the vaccine components Orders Placed This Encounter  Procedures   Pregnancy, urine   POCT urine pregnancy    [name], MD Vickie Grana [facility] 06/23/2023, 4:49 PM

## 2023-06-26 DIAGNOSIS — Z30433 Encounter for removal and reinsertion of intrauterine contraceptive device: Secondary | ICD-10-CM

## 2023-06-26 MED ORDER — ETONOGESTREL 68 MG ~~LOC~~ IMPL
68.0000 mg | DRUG_IMPLANT | Freq: Once | SUBCUTANEOUS | Status: AC
  Administered 2023-06-26: 68 mg via SUBCUTANEOUS

## 2023-06-26 NOTE — Addendum Note (Signed)
 Addended by: Francine Iron on: 06/26/2023 01:40 PM   Modules accepted: Orders

## 2023-07-17 DIAGNOSIS — F319 Bipolar disorder, unspecified: Secondary | ICD-10-CM | POA: Diagnosis not present

## 2023-07-21 DIAGNOSIS — Z9884 Bariatric surgery status: Secondary | ICD-10-CM | POA: Diagnosis not present

## 2023-07-21 DIAGNOSIS — Z903 Acquired absence of stomach [part of]: Secondary | ICD-10-CM | POA: Diagnosis not present

## 2023-07-21 DIAGNOSIS — K912 Postsurgical malabsorption, not elsewhere classified: Secondary | ICD-10-CM | POA: Diagnosis not present

## 2023-08-04 DIAGNOSIS — Z9884 Bariatric surgery status: Secondary | ICD-10-CM | POA: Diagnosis not present

## 2023-08-04 DIAGNOSIS — E66811 Obesity, class 1: Secondary | ICD-10-CM | POA: Diagnosis not present

## 2023-08-04 DIAGNOSIS — Z6833 Body mass index (BMI) 33.0-33.9, adult: Secondary | ICD-10-CM | POA: Diagnosis not present

## 2023-08-11 ENCOUNTER — Ambulatory Visit: Admitting: Family

## 2023-08-11 ENCOUNTER — Encounter: Payer: Self-pay | Admitting: Family

## 2023-08-11 VITALS — BP 125/72 | HR 78 | Temp 98.4°F | Ht 64.0 in | Wt 193.2 lb

## 2023-08-11 DIAGNOSIS — Z975 Presence of (intrauterine) contraceptive device: Secondary | ICD-10-CM

## 2023-08-11 DIAGNOSIS — M79602 Pain in left arm: Secondary | ICD-10-CM

## 2023-08-11 MED ORDER — NAPROXEN 500 MG PO TABS: 500.0000 mg | ORAL_TABLET | Freq: Two times a day (BID) | ORAL | 1 refills | Status: AC

## 2023-08-11 NOTE — Patient Instructions (Signed)
 Closing the Fallopian Tubes (Laparoscopic Tubal Ligation): What to Expect Laparoscopic tubal ligation is closing the fallopian tubes to prevent pregnancy. When the fallopian tubes are closed, the eggs that your ovaries release can't enter the uterus, and sperm can't reach the released eggs. This is a permanent form of birth control. You shouldn't have this done if you want to get pregnant someday or if you're unsure about having more children. Tell a health care provider about: Any allergies you have. All medicines you take. These include vitamins, herbs, eye drops, and creams. Any problems you or family members have had with anesthesia. Any bleeding problems you have. Any surgeries you've had. Any medical problems you have. Whether you're pregnant or may be pregnant. Other pregnancies in the past. What are the risks? Your health care provider will talk with you about risks. These may include: Infection. Bleeding. Injury to other organs in the belly. Side effects from anesthesia. The ligation not working. If this happens, you could get pregnant. Ectopic pregnancy. This is a pregnancy in which the fertilized egg attaches outside the uterus. What happens before? When to stop eating and drinking Eat and drink only as you've been told. You may be told this: 8 hours before your procedure Stop eating most foods. Do not eat meat, fried foods, or fatty foods. Eat only light foods, such as toast or crackers. All liquids are OK except energy drinks and alcohol. 6 hours before your procedure Stop eating. Drink only clear liquids, such as water, clear fruit juice, black coffee, plain tea, and sports drinks. Do not drink energy drinks or alcohol. 2 hours before your procedure Stop drinking all liquids. You may be allowed to take medicines with small sips of water. If you don't eat and drink as told, your procedure may be delayed or canceled. Medicines Ask about changing or stopping: Any  medicines you take. Any vitamins, herbs, or supplements you take. Do not take aspirin  or ibuprofen  unless you're told to. Surgery safety For your safety, you may: Need to wash your skin with a soap that kills germs. Get antibiotics. Have your procedure site marked. Have hair removed at the procedure site. General instructions Do not smoke, vape, or use nicotine or tobacco for at least 4 weeks before the procedure. Ask if you'll be staying overnight in the hospital. If you'll be going home right after the surgery, plan to have a responsible adult: Drive you home from the hospital or clinic. You won't be allowed to drive. Stay with you for the time you're told. What happens during laparoscopic tubal ligation?     An IV will be put into a vein in your hand or arm. You may be given: A sedative to help you relax. Anesthesia to keep you from feeling pain. A tube may be put down your throat to help you breathe. Your bladder may be emptied with a thin, soft tube (catheter). Two small cuts will be made in your lower belly and near your belly button. A gas will be pumped into your belly. This will let the surgeon see better and will give them room to work. A lighted tube with camera (laparoscope) will be put into your belly through one of the cuts. Small instruments will be inserted through the other cut. The fallopian tubes will be tied off, burned, or blocked with a clip, ring, or clamp. A small portion in the center of each fallopian tube may be removed. The gas will be released from your belly. The  cuts will be closed with stitches or skin tape. A bandage will be placed over the cuts. These steps may vary. Ask what you can expect. What happens after? You'll be watched closely until you leave. This includes checking your pain level, blood pressure, heart rate, and breathing rate. You'll be given medicine to help with pain. You'll be given medicines to prevent throwing up or the feeling  that you may throw up. You may have discharge from the vagina. You may need to wear a pad. If you were given a sedative, do not drive or use machines until you're told it's safe. A sedative can make you sleepy. This information is not intended to replace advice given to you by your health care provider. Make sure you discuss any questions you have with your health care provider. Document Revised: 11/17/2022 Document Reviewed: 11/17/2022 Elsevier Patient Education  2025 [employer].

## 2023-08-11 NOTE — Progress Notes (Signed)
 Subjective:    Patient ID: Kelly Fry, female    DOB: August 07, 1985, 38 y.o.   MRN: [id number]  Chief Complaint  Patient presents with   IMPLANT IN ARM SORE     LEFT ARM.    HPI Pt presents to the office today with left arm soreness after getting her Nexplanon  placed 06/23/23. Reports her arm is sore anytime she lays on her arm or extends her arm. Reports aching pain 4 out 10.    She has lost 100 lbs in the last three years.    Review of Systems  All other systems reviewed and are negative.   Social History   Socioeconomic History   Marital status: Divorced    Spouse name: Fonda Malling   Number of children: 2   Years of education: Not on file   Highest education level: Not on file  Occupational History   Occupation: Stay at Home  Tobacco Use   Smoking status: Never    Passive exposure: Current   Smokeless tobacco: Never  Vaping Use   Vaping status: Never Used  Substance and Sexual Activity   Alcohol use: No   Drug use: No   Sexual activity: Yes    Birth control/protection: I.U.D.  Other Topics Concern   Not on file  Social History Narrative   Not on file   Social Drivers of Health   Financial Resource Strain: Low Risk  (07/30/2022)   Received [facility]   Overall Financial Resource Strain (CARDIA)    Difficulty of Paying Living Expenses: Not hard at all  Food Insecurity: No Food Insecurity (07/30/2022)   Received from [facility]   Hunger Vital Sign    Within the past 12 months, you worried that your food would run out before you got the money to buy more.: Never true    Within the past 12 months, the food you bought just didn't last and you didn't have money to get more.: Never true  Transportation Needs: No Transportation Needs (07/30/2022)   Received from [facility] - Transportation    Lack of Transportation (Medical): No    Lack of Transportation (Non-Medical): No  Physical Activity: Insufficiently Active (07/30/2022)    Received from [facility]   Exercise Vital Sign    On average, how many days per week do you engage in moderate to strenuous exercise (like a brisk walk)?: 3 days    On average, how many minutes do you engage in exercise at this level?: 20 min  Stress: Stress Concern Present (07/30/2022)   Received from [facility] - Occupational Stress Questionnaire    Feeling of Stress : To some extent  Social Connections: Moderately Integrated (07/30/2022)   Received from [facility]   Social Network    How would you rate your social network (family, work, friends)?: Adequate participation with social networks   Family History  Problem Relation Age of Onset   Kidney disease Father    COPD Father    Hypertension Father    Depression Father    Sleep apnea Father    Obesity Father    Kidney disease Sister    Asthma Sister    Early death Mother    Stroke Mother    Heart disease Mother    Hypertension Mother    Alcoholism Mother    Asthma Brother         Objective:   Physical Exam Vitals  reviewed.  Constitutional:      General: She is not in acute distress.    Appearance: She is well-developed.   Eyes:     Pupils: Pupils are equal, round, and reactive to light.   Neck:     Thyroid : No thyromegaly.   Cardiovascular:     Rate and Rhythm: Normal rate and regular rhythm.     Heart sounds: Normal heart sounds. No murmur heard. Pulmonary:     Effort: Pulmonary effort is normal. No respiratory distress.     Breath sounds: Normal breath sounds. No wheezing.  Abdominal:     General: Bowel sounds are normal. There is no distension.     Palpations: Abdomen is soft.     Tenderness: There is no abdominal tenderness.   Musculoskeletal:        General: No tenderness. Normal range of motion.       Arms:     Cervical back: Normal range of motion and neck supple.     Comments: Nexplanon  in place in left arm. No erythemas noted. Area is  superficial and can see proximal half of device through arm    Skin:    General: Skin is warm and dry.   Neurological:     Mental Status: She is alert and oriented to person, place, and time.     Cranial Nerves: No cranial nerve deficit.     Deep Tendon Reflexes: Reflexes are normal and symmetric.   Psychiatric:        Behavior: Behavior normal.        Thought Content: Thought content normal.        Judgment: Judgment normal.       BP 125/72   Pulse 78   Temp 98.4 F (36.9 C) (Temporal)   Ht 5' 4 (1.626 m)   Wt 193 lb 3.2 oz (87.6 kg)   BMI 33.16 kg/m      Assessment & Plan:  Kelly Fry comes in today with chief complaint of IMPLANT IN ARM SORE  (LEFT ARM.)   Diagnosis and orders addressed:  1. Nexplanon  in place (Primary) I have refilled her naprosyn  to take for a few days to help with any inflammation.  I discussed her options that there is no infection noted. And this is more than likely related to her weight loss and the area sticking up is getting irritated by items rubbing it.  Discussed she would need to decide if this soreness is something she can tolerate or have it removed.  Did discussed a tubal ligation and she wanted to know if she was a candidate   2. Pain of left upper extremity     Bari Learn, FNP

## 2023-09-08 DIAGNOSIS — F319 Bipolar disorder, unspecified: Secondary | ICD-10-CM | POA: Diagnosis not present

## 2023-09-22 DIAGNOSIS — F319 Bipolar disorder, unspecified: Secondary | ICD-10-CM | POA: Diagnosis not present

## 2023-09-29 DIAGNOSIS — F319 Bipolar disorder, unspecified: Secondary | ICD-10-CM | POA: Diagnosis not present

## 2023-10-30 DIAGNOSIS — F319 Bipolar disorder, unspecified: Secondary | ICD-10-CM | POA: Diagnosis not present

## 2023-10-31 DIAGNOSIS — F319 Bipolar disorder, unspecified: Secondary | ICD-10-CM | POA: Diagnosis not present

## 2023-12-19 DIAGNOSIS — F319 Bipolar disorder, unspecified: Secondary | ICD-10-CM | POA: Diagnosis not present

## 2023-12-25 ENCOUNTER — Ambulatory Visit: Admitting: Family Medicine

## 2023-12-25 ENCOUNTER — Ambulatory Visit: Payer: Self-pay | Admitting: *Deleted

## 2023-12-25 ENCOUNTER — Encounter: Payer: Self-pay | Admitting: Family Medicine

## 2023-12-25 VITALS — BP 120/72 | HR 76 | Temp 98.7°F | Ht 64.0 in | Wt 196.4 lb

## 2023-12-25 DIAGNOSIS — L739 Follicular disorder, unspecified: Secondary | ICD-10-CM

## 2023-12-25 MED ORDER — LEVOCETIRIZINE DIHYDROCHLORIDE 5 MG PO TABS: 5.0000 mg | ORAL_TABLET | Freq: Every day | ORAL | 0 refills | Status: AC | PRN

## 2023-12-25 MED ORDER — DOXYCYCLINE HYCLATE 100 MG PO TABS: 100.0000 mg | ORAL_TABLET | Freq: Two times a day (BID) | ORAL | 0 refills | Status: AC

## 2023-12-25 NOTE — Telephone Encounter (Signed)
   FYI Only or Action Required?: FYI only for provider: appointment scheduled on 12/25/23.  Patient was last seen in primary care on 08/11/2023 by Lavell Bari LABOR, FNP.  Called Nurse Triage reporting Rash.  Symptoms began a week ago.  Interventions attempted: OTC medications: antihistamines, hydrocortisone cream with no relief.  Symptoms are: rapidly worsening.  Triage Disposition: See Physician Within 24 Hours  Patient/caregiver understands and will follow disposition?: Yes   Patient scheduled appt today with DOD per patient request due to she has another appt tomorrow and can not make appt with PCP tomorrow. Itching has become severe and no OTC products working.  Rash spreading .             Copied from CRM [id number]. Topic: Clinical - Red Word Triage >> Dec 25, 2023  8:11 AM Susanna ORN wrote: Red Word that prompted transfer to Nurse Triage: Patient states she went to the beach last weekend & came home with a rash all over her body. States it started on her feet, then went to back of left knee, and all over her stomach. They are little red dots & they are extremely itchy, especially if she were tight fitted clothes. No drainage or fever. Wants to be seen by PCP if possible. Reason for Disposition  SEVERE itching (i.e., interferes with sleep, normal activities or school)  Answer Assessment - Initial Assessment Questions Rash spreading and OTC products not working. Requesting appt today with any provider. Scheduled today with DOD.      1. APPEARANCE of RASH: What does the rash look like? (e.g., blisters, dry flaky skin, red spots, redness, sores)     Tiny red dots  2. SIZE: How big are the spots? (e.g., tip of pen, eraser, coin; inches, centimeters)     Tiny like tip of pen  3. LOCATION: Where is the rash located?     Started on feet spread, and  4. COLOR: What color is the rash? (Note: It is difficult to assess rash color in people with darker-colored skin.  When this situation occurs, simply ask the caller to describe what they see.)     red 5. ONSET: When did the rash begin?    Approx 12/13/23. 6. FEVER: Do you have a fever? If Yes, ask: What is your temperature, how was it measured, and when did it start?     na 7. ITCHING: Does the rash itch? If Yes, ask: How bad is the itch? (Scale 1-10; or mild, moderate, severe)     Moderate severe waking up at night with scratching  8. CAUSE: What do you think is causing the rash?     Not sure went to beach and started noted rash  9. MEDICINE FACTORS: Have you started any new medicines within the last 2 weeks? (e.g., antibiotics)      no 10. OTHER SYMPTOMS: Do you have any other symptoms? (e.g., dizziness, headache, sore throat, joint pain)       Feet , back of left leg.  and stomach  severe itching during the night. No fever.  11. PREGNANCY: Is there any chance you are pregnant? When was your last menstrual period?       na  Protocols used: Rash or Redness - [facility]

## 2023-12-25 NOTE — Patient Instructions (Signed)
Folliculitis  Folliculitis occurs when hair follicles become inflamed. A hair follicle is a tiny opening in your skin where your hair grows from. This condition often occurs on the scalp, thighs, legs, back, and buttocks but can happen anywhere on the body. What are the causes? A common cause of this condition is an infection from bacteria. The type of folliculitis caused by bacteria can last a long time or go away and come back. The bacteria can live anywhere on your skin. They are often found in the nostrils. Other causes may include: An infection from a fungus. An infection from a virus. Your skin touching some chemicals, such as oils and tars. Shaving or waxing. Greasy ointments or creams put on the skin. What increases the risk? You are more likely to develop this condition if: Your body has a weak disease-fighting system (immune system). You have diabetes. You are obese. What are the signs or symptoms? Symptoms of this condition include: Redness. Soreness. Swelling. Itching. Small white or yellow, itchy spots filled with pus (pustules) that appear over a red area. If the infection goes deep into the follicle, these may turn into a boil (furuncle). A group of boils (carbuncle). These tend to form in hairy, sweaty areas of the body. How is this diagnosed? This condition is diagnosed with a skin exam. Your health care provider may take a sample of one of the pustules or boils to test in a lab. How is this treated? This condition may be treated by: Putting a warm, wet cloth (warm compress) on the affected areas. Taking antibiotics or applying them to the skin. Applying or bathing with a solution that kills germs (antiseptic). Taking an over-the-counter medicine. This can help with itching. Having a procedure to drain pustules or boils. This may be done if a pustule or boil contains a lot of pus or fluid. Having laser hair removal. This may be done when the condition lasts for a  long time. Follow these instructions at home: Managing pain and swelling  If directed, apply heat to the affected area as often as told by your health care provider. Use the heat source that your health care provider recommends, such as a moist heat pack or a heating pad. Place a towel between your skin and the heat source. Leave the heat on for 20-30 minutes. If your skin turns bright red, remove the heat right away to prevent burns. The risk of burns is higher if you cannot feel pain, heat, or cold. General instructions Take over-the-counter and prescription medicines only as told by your health care provider. If you were prescribed antibiotics, take or apply them as told by your health care provider. Do not stop using the antibiotic even if you start to feel better. Check your irritated area every day for signs of infection. Check for: More redness, swelling, or pain. Fluid or blood. Warmth. Pus or a bad smell. Do not shave irritated skin. Keep all follow-up visits. Your health care provider will check if the treatments are helping. Contact a health care provider if: You have a fever. You have any signs of infection. Red streaks are spreading from the affected area. This information is not intended to replace advice given to you by your health care provider. Make sure you discuss any questions you have with your health care provider. Document Revised: 07/03/2021 Document Reviewed: 07/03/2021 Elsevier Patient Education  2024 [employer].

## 2023-12-25 NOTE — Telephone Encounter (Signed)
Noted  -LS

## 2023-12-25 NOTE — Progress Notes (Signed)
   Acute Office Visit  Subjective:     Patient ID: Kelly Fry, female    DOB: 26-Jul-1985, 38 y.o.   MRN: [id number]  Chief Complaint  Patient presents with   Rash    HPI  History of Present Illness   Kelly Fry is a 38 year old female who presents with a persistent rash.  Pruritic rash - Persistent rash for approximately two weeks, onset after a trip to the beach - Initial location on feet, subsequently spreading to abdomen and posterior knees - Very pruritic, without pain or drainage - No similar symptoms in her children despite sharing a bed - No recent changes in personal care products - No exposure to hot, sweaty conditions, hot tubs, or swimming during the trip - Did not remove shoes at the beach and had no direct water contact - Rash has become very pruritic and she has been scratching consistently  Self-treatment and symptom management - Tried triple antibiotic ointment, hydrocortisone cream, and Benadryl  with only short-term relief - Benadryl  causes significant drowsiness, even at half doses - Has not tried Zyrtec or similar antihistamines - Taking multiple showers daily - Keeps nails short to avoid further irritation from scratching       ROS As per HPI.      Objective:    BP 120/72   Pulse 76   Temp 98.7 F (37.1 C) (Temporal)   Ht 5' 4 (1.626 m)   Wt 196 lb 6.4 oz (89.1 kg)   SpO2 99%   BMI 33.71 kg/m    Physical Exam Vitals and nursing note reviewed.  Constitutional:      General: She is not in acute distress.    Appearance: She is not ill-appearing, toxic-appearing or diaphoretic.  Pulmonary:     Effort: No respiratory distress.  Musculoskeletal:     Right lower leg: No edema.     Left lower leg: No edema.  Skin:    General: Skin is warm and dry.     Findings: Rash (small erythematous papules around hair follicules of trunk, lower legs) present.  Neurological:     General: No focal deficit present.     Mental Status: She is  alert and oriented to person, place, and time.     No results found for any visits on 12/25/23.      Assessment & Plan:   Kelly Fry was seen today for rash.  Diagnoses and all orders for this visit:  Folliculitis -     doxycycline (VIBRA-TABS) 100 MG tablet; Take 1 tablet (100 mg total) by mouth 2 (two) times daily for 7 days. 1 po bid -     levocetirizine (XYZAL) 5 MG tablet; Take 1 tablet (5 mg total) by mouth daily as needed for allergies (itching).   Assessment and Plan    Folliculitis - Prescribed doxycycline for one week  - Sent prescription for Xyzal for itching    Return to office for new or worsening symptoms, or if symptoms persist.   Kelly CHRISTELLA Search, FNP

## 2024-02-10 DIAGNOSIS — F319 Bipolar disorder, unspecified: Secondary | ICD-10-CM | POA: Diagnosis not present
